# Patient Record
Sex: Female | Born: 1960 | Race: Black or African American | Hispanic: No | Marital: Single | Smoking: Never smoker
Health system: Southern US, Community
[De-identification: ages and names within clinical notes are randomized; demographics above are authoritative.]

## PROBLEM LIST (undated history)

## (undated) DIAGNOSIS — M797 Fibromyalgia: Secondary | ICD-10-CM

## (undated) DIAGNOSIS — J45909 Unspecified asthma, uncomplicated: Secondary | ICD-10-CM

## (undated) DIAGNOSIS — M1990 Unspecified osteoarthritis, unspecified site: Secondary | ICD-10-CM

## (undated) DIAGNOSIS — M2550 Pain in unspecified joint: Secondary | ICD-10-CM

## (undated) HISTORY — PX: FOOT SURGERY: SHX648

## (undated) HISTORY — DX: Unspecified asthma, uncomplicated: J45.909

## (undated) HISTORY — PX: CATARACT EXTRACTION: SUR2

## (undated) HISTORY — PX: ABDOMINAL HYSTERECTOMY: SHX81

## (undated) HISTORY — PX: CHOLECYSTECTOMY: SHX55

---

## 2000-06-13 ENCOUNTER — Observation Stay (HOSPITAL_COMMUNITY): Admission: EM | Admit: 2000-06-13 | Discharge: 2000-06-14 | Payer: Self-pay | Admitting: *Deleted

## 2000-06-23 ENCOUNTER — Emergency Department (HOSPITAL_COMMUNITY): Admission: EM | Admit: 2000-06-23 | Discharge: 2000-06-23 | Payer: Self-pay | Admitting: Emergency Medicine

## 2001-06-09 ENCOUNTER — Emergency Department (HOSPITAL_COMMUNITY): Admission: EM | Admit: 2001-06-09 | Discharge: 2001-06-09 | Payer: Self-pay | Admitting: Emergency Medicine

## 2002-06-08 ENCOUNTER — Encounter: Admission: RE | Admit: 2002-06-08 | Discharge: 2002-06-08 | Payer: Self-pay | Admitting: Occupational Medicine

## 2002-06-08 ENCOUNTER — Encounter: Payer: Self-pay | Admitting: Occupational Medicine

## 2002-07-07 ENCOUNTER — Emergency Department (HOSPITAL_COMMUNITY): Admission: EM | Admit: 2002-07-07 | Discharge: 2002-07-07 | Payer: Self-pay | Admitting: Emergency Medicine

## 2003-03-14 ENCOUNTER — Encounter: Payer: Self-pay | Admitting: Internal Medicine

## 2003-03-14 ENCOUNTER — Encounter: Admission: RE | Admit: 2003-03-14 | Discharge: 2003-03-14 | Payer: Self-pay | Admitting: Internal Medicine

## 2003-06-21 ENCOUNTER — Emergency Department (HOSPITAL_COMMUNITY): Admission: EM | Admit: 2003-06-21 | Discharge: 2003-06-21 | Payer: Self-pay | Admitting: Emergency Medicine

## 2003-07-06 ENCOUNTER — Emergency Department (HOSPITAL_COMMUNITY): Admission: EM | Admit: 2003-07-06 | Discharge: 2003-07-06 | Payer: Self-pay | Admitting: Emergency Medicine

## 2003-07-07 ENCOUNTER — Emergency Department (HOSPITAL_COMMUNITY): Admission: EM | Admit: 2003-07-07 | Discharge: 2003-07-07 | Payer: Self-pay | Admitting: Emergency Medicine

## 2003-07-07 ENCOUNTER — Encounter: Payer: Self-pay | Admitting: Emergency Medicine

## 2003-12-05 ENCOUNTER — Encounter: Admission: RE | Admit: 2003-12-05 | Discharge: 2003-12-05 | Payer: Self-pay | Admitting: Internal Medicine

## 2003-12-13 ENCOUNTER — Encounter: Admission: RE | Admit: 2003-12-13 | Discharge: 2003-12-13 | Payer: Self-pay | Admitting: Occupational Medicine

## 2003-12-20 ENCOUNTER — Encounter: Admission: RE | Admit: 2003-12-20 | Discharge: 2004-02-02 | Payer: Self-pay | Admitting: Occupational Medicine

## 2004-01-30 ENCOUNTER — Emergency Department (HOSPITAL_COMMUNITY): Admission: EM | Admit: 2004-01-30 | Discharge: 2004-01-30 | Payer: Self-pay

## 2004-02-20 ENCOUNTER — Encounter: Admission: RE | Admit: 2004-02-20 | Discharge: 2004-03-01 | Payer: Self-pay | Admitting: Occupational Medicine

## 2004-03-01 ENCOUNTER — Emergency Department (HOSPITAL_COMMUNITY): Admission: EM | Admit: 2004-03-01 | Discharge: 2004-03-01 | Payer: Self-pay | Admitting: *Deleted

## 2004-03-09 ENCOUNTER — Emergency Department (HOSPITAL_COMMUNITY): Admission: AD | Admit: 2004-03-09 | Discharge: 2004-03-09 | Payer: Self-pay | Admitting: Family Medicine

## 2004-03-15 ENCOUNTER — Emergency Department (HOSPITAL_COMMUNITY): Admission: EM | Admit: 2004-03-15 | Discharge: 2004-03-15 | Payer: Self-pay | Admitting: Emergency Medicine

## 2004-03-27 ENCOUNTER — Emergency Department (HOSPITAL_COMMUNITY): Admission: EM | Admit: 2004-03-27 | Discharge: 2004-03-27 | Payer: Self-pay | Admitting: Emergency Medicine

## 2004-08-02 ENCOUNTER — Emergency Department (HOSPITAL_COMMUNITY): Admission: EM | Admit: 2004-08-02 | Discharge: 2004-08-02 | Payer: Self-pay | Admitting: Emergency Medicine

## 2004-12-31 ENCOUNTER — Ambulatory Visit (HOSPITAL_COMMUNITY): Admission: RE | Admit: 2004-12-31 | Discharge: 2004-12-31 | Payer: Self-pay | Admitting: Internal Medicine

## 2005-01-07 ENCOUNTER — Emergency Department (HOSPITAL_COMMUNITY): Admission: EM | Admit: 2005-01-07 | Discharge: 2005-01-07 | Payer: Self-pay | Admitting: Family Medicine

## 2005-03-10 ENCOUNTER — Emergency Department (HOSPITAL_COMMUNITY): Admission: EM | Admit: 2005-03-10 | Discharge: 2005-03-11 | Payer: Self-pay | Admitting: *Deleted

## 2005-03-10 ENCOUNTER — Emergency Department (HOSPITAL_COMMUNITY): Admission: EM | Admit: 2005-03-10 | Discharge: 2005-03-10 | Payer: Self-pay | Admitting: Emergency Medicine

## 2006-02-20 ENCOUNTER — Emergency Department: Payer: Self-pay | Admitting: Unknown Physician Specialty

## 2006-02-21 ENCOUNTER — Emergency Department: Payer: Self-pay | Admitting: Emergency Medicine

## 2006-05-29 ENCOUNTER — Ambulatory Visit (HOSPITAL_COMMUNITY): Admission: RE | Admit: 2006-05-29 | Discharge: 2006-05-29 | Payer: Self-pay | Admitting: Internal Medicine

## 2006-07-18 ENCOUNTER — Ambulatory Visit: Payer: Self-pay | Admitting: Internal Medicine

## 2006-07-20 ENCOUNTER — Ambulatory Visit: Payer: Self-pay | Admitting: Internal Medicine

## 2006-12-10 ENCOUNTER — Encounter: Payer: Self-pay | Admitting: Internal Medicine

## 2006-12-22 ENCOUNTER — Encounter: Payer: Self-pay | Admitting: Internal Medicine

## 2007-01-22 ENCOUNTER — Encounter: Payer: Self-pay | Admitting: Internal Medicine

## 2007-01-30 ENCOUNTER — Emergency Department: Payer: Self-pay | Admitting: Emergency Medicine

## 2007-05-31 ENCOUNTER — Ambulatory Visit (HOSPITAL_COMMUNITY): Admission: RE | Admit: 2007-05-31 | Discharge: 2007-05-31 | Payer: Self-pay | Admitting: Specialist

## 2008-05-31 ENCOUNTER — Ambulatory Visit (HOSPITAL_COMMUNITY): Admission: RE | Admit: 2008-05-31 | Discharge: 2008-05-31 | Payer: Self-pay

## 2008-06-18 ENCOUNTER — Emergency Department (HOSPITAL_COMMUNITY): Admission: EM | Admit: 2008-06-18 | Discharge: 2008-06-18 | Payer: Self-pay | Admitting: Emergency Medicine

## 2011-05-09 NOTE — Discharge Summary (Signed)
[name]. [facility]  Patient:    Gabrielle Shaw, Gabrielle Shaw                        MRN: [id number] Adm. Date:  [id number] Disc. Date: [id number] Attending:  Lenora Boys CC:         Rosanne Sack, M.D.             Kerry Kass, M.D. [facility]                           Discharge Summary  DISCHARGE DIAGNOSES: 1. Anaphylaxis, likely secondary to peanuts - resolved. 2. History of asthma. 3. History of multiple allergies including SULFA DRUGS, BIAXIN, ASPIRIN,    PEANUTS, BANANAS, APPLES, and CATS.  DISCHARGE MEDICATIONS: 1. Zaditor ophthalmic drops, two drops in each eye b.i.d. 2. Advair 50/500, two puffs b.i.d. 3. Claritin D 24, one p.o. q.d. 4. Flonase, two puffs in each nostril b.i.d. 5. Premarin 0.9 mg p.o. q.d. 6. ______ eyedrops dosed as per prior to admission.  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  She was in her usual health, feeling well, when she ate a and ice cream sandwich which she had brought to work.  She had had prior tongue swelling with eating peanuts and a Coke in the past.  She did not think that she was very allergic to peanuts but, when her face started swelling, she tried to take a dose of EpiPen and actually vomited it and passed out.  She was given epinephrine 0.3 mg and Solu-Medrol 125 mg (actually in the [facility] Emergency Room), and this was followed with 25 mg of Benadryl IV, Phenergan 12.5 mg IV several hours later and IV Pepcid.  She responded quite well to these therapies and, when evaluated by Dr. Dewaine Oats for admission, simply had slight residual urticaria over her forehead. Dr. Elliot Gurney, who evaluated her later in the day, revealed some residual peribuccal erythema and symptoms of a "thick tongue," and these resolved later that day.  On the morning of discharge, she felt well.  She was treated with intravenous steroids q.6h. as  well as hydroxyzine p.o. q.8h.  She feels back to baseline. Vital signs are stable and the thick tongue sensation has cleared and her lungs are clear.  She is to call Dr. Stevphen Rochester for follow up within the next week or so and abstain from allergies as listed above. DD:  06/14/00 TD:  06/16/00 Job: [id number]

## 2013-04-07 ENCOUNTER — Encounter (HOSPITAL_BASED_OUTPATIENT_CLINIC_OR_DEPARTMENT_OTHER): Payer: Self-pay

## 2013-04-07 ENCOUNTER — Emergency Department (HOSPITAL_BASED_OUTPATIENT_CLINIC_OR_DEPARTMENT_OTHER)
Admission: EM | Admit: 2013-04-07 | Discharge: 2013-04-08 | Disposition: A | Discharge status: Home / Self Care | Payer: PRIVATE HEALTH INSURANCE | Attending: Emergency Medicine | Admitting: Emergency Medicine

## 2013-04-07 ENCOUNTER — Emergency Department (HOSPITAL_BASED_OUTPATIENT_CLINIC_OR_DEPARTMENT_OTHER): Payer: PRIVATE HEALTH INSURANCE

## 2013-04-07 DIAGNOSIS — Z8739 Personal history of other diseases of the musculoskeletal system and connective tissue: Secondary | ICD-10-CM | POA: Insufficient documentation

## 2013-04-07 DIAGNOSIS — Z90710 Acquired absence of both cervix and uterus: Secondary | ICD-10-CM | POA: Insufficient documentation

## 2013-04-07 DIAGNOSIS — R160 Hepatomegaly, not elsewhere classified: Secondary | ICD-10-CM

## 2013-04-07 DIAGNOSIS — K7689 Other specified diseases of liver: Secondary | ICD-10-CM | POA: Insufficient documentation

## 2013-04-07 DIAGNOSIS — Z9089 Acquired absence of other organs: Secondary | ICD-10-CM | POA: Insufficient documentation

## 2013-04-07 DIAGNOSIS — R197 Diarrhea, unspecified: Secondary | ICD-10-CM | POA: Insufficient documentation

## 2013-04-07 DIAGNOSIS — R112 Nausea with vomiting, unspecified: Secondary | ICD-10-CM | POA: Insufficient documentation

## 2013-04-07 HISTORY — DX: Fibromyalgia: M79.7

## 2013-04-07 HISTORY — DX: Unspecified osteoarthritis, unspecified site: M19.90

## 2013-04-07 LAB — COMPREHENSIVE METABOLIC PANEL
ALT: 17 U/L (ref 0–35)
AST: 24 U/L (ref 0–37)
Albumin: 3.8 g/dL (ref 3.5–5.2)
Alkaline Phosphatase: 127 U/L — ABNORMAL HIGH (ref 39–117)
BUN: 9 mg/dL (ref 6–23)
CO2: 27 mEq/L (ref 19–32)
Calcium: 10.2 mg/dL (ref 8.4–10.5)
Chloride: 103 mEq/L (ref 96–112)
Creatinine, Ser: 0.8 mg/dL (ref 0.50–1.10)
GFR calc Af Amer: >90 mL/min (ref >90)
GFR calc non Af Amer: 83 mL/min — ABNORMAL LOW (ref >90)
Glucose, Bld: 109 mg/dL — ABNORMAL HIGH (ref 70–99)
Potassium: 3.8 mEq/L (ref 3.5–5.1)
Sodium: 140 mEq/L (ref 135–145)
Total Bilirubin: 0.2 mg/dL — ABNORMAL LOW (ref 0.3–1.2)
Total Protein: 7.7 g/dL (ref 6.0–8.3)

## 2013-04-07 LAB — LIPASE, BLOOD: Lipase: 17 U/L (ref 11–59)

## 2013-04-07 LAB — CBC WITH DIFFERENTIAL/PLATELET
Basophils Absolute: 0 10*3/uL (ref 0.0–0.1)
Basophils Relative: 0 % (ref 0–1)
Eosinophils Absolute: 0.3 10*3/uL (ref 0.0–0.7)
Eosinophils Relative: 3 % (ref 0–5)
HCT: 40.9 % (ref 36.0–46.0)
Hemoglobin: 13.8 g/dL (ref 12.0–15.0)
Lymphocytes Relative: 33 % (ref 12–46)
Lymphs Abs: 2.6 10*3/uL (ref 0.7–4.0)
MCH: 28.8 pg (ref 26.0–34.0)
MCHC: 33.7 g/dL (ref 30.0–36.0)
MCV: 85.4 fL (ref 78.0–100.0)
Monocytes Absolute: 0.8 10*3/uL (ref 0.1–1.0)
Monocytes Relative: 10 % (ref 3–12)
Neutro Abs: 4.4 10*3/uL (ref 1.7–7.7)
Neutrophils Relative %: 54 % (ref 43–77)
Platelets: 213 10*3/uL (ref 150–400)
RBC: 4.79 MIL/uL (ref 3.87–5.11)
RDW: 13.8 % (ref 11.5–15.5)
WBC: 8.1 10*3/uL (ref 4.0–10.5)

## 2013-04-07 MED ORDER — KETOROLAC TROMETHAMINE 30 MG/ML IJ SOLN
30.0000 mg | Freq: Once | INTRAMUSCULAR | Status: AC
  Administered 2013-04-07: 30 mg via INTRAVENOUS
  Filled 2013-04-07: qty 1

## 2013-04-07 MED ORDER — SODIUM CHLORIDE 0.9 % IV BOLUS (SEPSIS)
1000.0000 mL | Freq: Once | INTRAVENOUS | Status: AC
  Administered 2013-04-07: 1000 mL via INTRAVENOUS

## 2013-04-07 MED ORDER — ONDANSETRON HCL 4 MG/2ML IJ SOLN
4.0000 mg | Freq: Once | INTRAMUSCULAR | Status: AC
  Filled 2013-04-07: qty 2

## 2013-04-07 MED ORDER — ONDANSETRON HCL 4 MG/2ML IJ SOLN
4.0000 mg | Freq: Once | INTRAMUSCULAR | Status: AC
  Administered 2013-04-07: 4 mg via INTRAVENOUS
  Filled 2013-04-07: qty 2

## 2013-04-07 NOTE — ED Notes (Signed)
MD at bedside. 

## 2013-04-07 NOTE — ED Notes (Signed)
C/o abd pain started tonight-intermittent diarrhea x 1 week

## 2013-04-08 LAB — URINALYSIS, ROUTINE W REFLEX MICROSCOPIC
Bilirubin Urine: NEGATIVE
Glucose, UA: NEGATIVE mg/dL
Hgb urine dipstick: NEGATIVE
Ketones, ur: NEGATIVE mg/dL
Leukocytes, UA: NEGATIVE
Nitrite: NEGATIVE
Protein, ur: NEGATIVE mg/dL
Specific Gravity, Urine: 1.005 (ref 1.005–1.030)
Urobilinogen, UA: 0.2 mg/dL (ref 0.0–1.0)
pH: 6.5 (ref 5.0–8.0)

## 2013-04-08 MED ORDER — ONDANSETRON HCL 4 MG/2ML IJ SOLN
4.0000 mg | Freq: Once | INTRAMUSCULAR | Status: AC
  Administered 2013-04-08: 4 mg via INTRAVENOUS

## 2013-04-08 MED ORDER — ONDANSETRON HCL 4 MG/2ML IJ SOLN
INTRAMUSCULAR | Status: AC
  Filled 2013-04-08: qty 2

## 2013-04-08 NOTE — ED Notes (Signed)
MD at bedside. 

## 2013-04-08 NOTE — ED Provider Notes (Signed)
History     CSN: [id number]  Arrival date & time 04/07/13  2237   First MD Initiated Contact with Patient 04/07/13 2306      Chief Complaint  Patient presents with  . Abdominal Pain    (Consider location/radiation/quality/duration/timing/severity/associated sxs/prior treatment) Patient is a 52 y.o. female presenting with abdominal pain. The history is provided by the patient. No language interpreter was used.  Abdominal Pain Pain location:  Generalized Pain quality: cramping   Pain radiates to:  Does not radiate Pain severity:  Moderate Onset quality:  Gradual Timing:  Constant Progression:  Unchanged Chronicity:  Recurrent Context: not diet changes   Relieved by:  Nothing Worsened by:  Nothing tried Ineffective treatments:  None tried Associated symptoms: diarrhea, nausea and vomiting   Diarrhea:    Quality:  Watery   Number of occurrences:  5   Severity:  Moderate   Diarrhea duration: Had diarrhea with nausea and vomiting 5 days ago and then got better and it returned this aftrernoon.   Timing:  Sporadic   Progression:  Unchanged Risk factors: multiple surgeries   Symptoms returned this afternoon following eating sausage and hamburger pizza  Past Medical History  Diagnosis Date  . Fibromyalgia   . Osteoarthritis     Past Surgical History  Procedure Laterality Date  . Cholecystectomy    . Abdominal hysterectomy      No family history on file.  History  Substance Use Topics  . Smoking status: Never Smoker   . Smokeless tobacco: Not on file  . Alcohol Use: No    OB History   Grav Para Term Preterm Abortions TAB SAB Ect Mult Living                  Review of Systems  Gastrointestinal: Positive for nausea, vomiting, abdominal pain and diarrhea.  All other systems reviewed and are negative.    Allergies  Ivp dye; Banana; Biaxin; Latex; Peanuts; Sulfa antibiotics; and Tetrofosmin  Home Medications   Current Outpatient Rx  Name  Route  Sig   Dispense  Refill  . albuterol (PROVENTIL HFA;VENTOLIN HFA) 108 (90 BASE) MCG/ACT inhaler   Inhalation   Inhale 2 puffs into the lungs every 6 (six) hours as needed for wheezing.         . Beclomethasone Dipropionate (QVAR IN)   Inhalation   Inhale into the lungs.         . Famotidine (PEPCID PO)   Oral   Take by mouth.         . Naproxen (NAPROSYN PO)   Oral   Take by mouth.           BP 140/54  Pulse 56  Temp(Src) 98.3 F (36.8 C) (Oral)  Resp 16  Ht 5\' 2"  (1.575 m)  Wt 180 lb (81.647 kg)  BMI 32.91 kg/m2  SpO2 97%  Physical Exam  Constitutional: She is oriented to person, place, and time. She appears well-developed and well-nourished. No distress.  HENT:  Head: Normocephalic and atraumatic.  Mouth/Throat: Oropharynx is clear and moist.  Eyes: Conjunctivae are normal. Pupils are equal, round, and reactive to light.  Neck: Normal range of motion. Neck supple.  Cardiovascular: Normal rate and regular rhythm.   Pulmonary/Chest: Effort normal and breath sounds normal. No respiratory distress. She has no wheezes. She has no rales.  Abdominal: Soft. Bowel sounds are normal. She exhibits no distension and no mass. There is no tenderness. There is no rebound and no  guarding.  Musculoskeletal: Normal range of motion.  Neurological: She is alert and oriented to person, place, and time.  Skin: Skin is warm and dry.  Psychiatric: She has a normal mood and affect.    ED Course  Procedures (including critical care time)  Labs Reviewed  COMPREHENSIVE METABOLIC PANEL - Abnormal; Notable for the following:    Glucose, Bld 109 (*)    Alkaline Phosphatase 127 (*)    Total Bilirubin 0.2 (*)    GFR calc non Af Amer 83 (*)    All other components within normal limits  CBC WITH DIFFERENTIAL  URINALYSIS, ROUTINE W REFLEX MICROSCOPIC  LIPASE, BLOOD   Ct Abdomen Pelvis Wo Contrast  04/08/2013  *RADIOLOGY REPORT*  Clinical Data: Left-sided abdominal pain and diarrhea.  CT  ABDOMEN AND PELVIS WITHOUT CONTRAST  Technique:  Multidetector CT imaging of the abdomen and pelvis was performed following the standard protocol without intravenous contrast.  Comparison: None.  Findings: There is a 2.8 x 3.5 x 3.0 cm mass in this central of the right lobe of the liver, slightly low density as compared to the unenhanced liver.  Liver is otherwise normal.  Gallbladder has been removed.  The spleen is normal.  There are two soft tissue nodules anterior to the fundus of the stomach and adjacent to the anterior aspect of the spleen, one measuring 3.2 cm in diameter and the other being 2.4 cm in diameter.  I suspect these both represent accessory spleens.  None of the lesions is immediately adjacent to the tip of the left lobe the liver but I think it is separate from the liver.  The pancreas, adrenal glands, and kidneys are normal.  The bowel is normal.  Appendix has been removed.  Uterus has been removed.  No definite ovarian remnants.  No osseous abnormality.  IMPRESSION: Nonspecific 3.5 cm mass in the right lobe of the liver.  Because of the patient's iodinated contrast allergy, MRI of the liver with without MRI contrast is recommended for further evaluation.  There are a few small diverticula in the left side of the colon. No diverticulitis.  No other significant abnormalities.   Original Report Authenticated By: Francene Boyers, M.D.      1. Diarrhea   2. Liver mass       MDM  N/v/d.  Likely viral.  No signs of IBD nor diverticulitis.  Labs, vitals and exam benign.  Patient informed of all lab, urine and CT results.  Informed of 3.5 cm liver mass seen on CT scan and need for close follow up with her family doctor and need to call doctor in the am to arrange outpatient MRI of the liver/abdomen to characterize the mass seen on ct scan.  30 minutes spent answering patient's questions.  Patient verbalized understanding of all test and CT results and need for close follow up and need to  outpatient MRI and agrees to follow up with her family doctor.  States she will call her family doctor in the am to arrange this test.  Parke Simmers diet instructions provided.          Jasmine Awe, MD 04/08/13 [phone]

## 2014-02-19 ENCOUNTER — Emergency Department (HOSPITAL_BASED_OUTPATIENT_CLINIC_OR_DEPARTMENT_OTHER)
Admission: EM | Admit: 2014-02-19 | Discharge: 2014-02-19 | Disposition: A | Discharge status: Home / Self Care | Payer: PRIVATE HEALTH INSURANCE | Attending: Emergency Medicine | Admitting: Emergency Medicine

## 2014-02-19 ENCOUNTER — Emergency Department (HOSPITAL_BASED_OUTPATIENT_CLINIC_OR_DEPARTMENT_OTHER): Payer: PRIVATE HEALTH INSURANCE

## 2014-02-19 ENCOUNTER — Encounter (HOSPITAL_BASED_OUTPATIENT_CLINIC_OR_DEPARTMENT_OTHER): Payer: Self-pay | Admitting: Emergency Medicine

## 2014-02-19 DIAGNOSIS — J029 Acute pharyngitis, unspecified: Secondary | ICD-10-CM | POA: Insufficient documentation

## 2014-02-19 DIAGNOSIS — Z79899 Other long term (current) drug therapy: Secondary | ICD-10-CM | POA: Insufficient documentation

## 2014-02-19 DIAGNOSIS — J209 Acute bronchitis, unspecified: Secondary | ICD-10-CM

## 2014-02-19 DIAGNOSIS — Z8739 Personal history of other diseases of the musculoskeletal system and connective tissue: Secondary | ICD-10-CM | POA: Insufficient documentation

## 2014-02-19 DIAGNOSIS — Z91040 Latex allergy status: Secondary | ICD-10-CM | POA: Insufficient documentation

## 2014-02-19 DIAGNOSIS — H6690 Otitis media, unspecified, unspecified ear: Secondary | ICD-10-CM | POA: Insufficient documentation

## 2014-02-19 DIAGNOSIS — J329 Chronic sinusitis, unspecified: Secondary | ICD-10-CM | POA: Insufficient documentation

## 2014-02-19 DIAGNOSIS — H6691 Otitis media, unspecified, right ear: Secondary | ICD-10-CM

## 2014-02-19 MED ORDER — BENZONATATE 100 MG PO CAPS: 200.0000 mg | ORAL_CAPSULE | Freq: Three times a day (TID) | ORAL | Status: DC

## 2014-02-19 MED ORDER — ALBUTEROL SULFATE HFA 108 (90 BASE) MCG/ACT IN AERS
2.0000 | INHALATION_SPRAY | RESPIRATORY_TRACT | Status: DC | PRN
  Administered 2014-02-19: 2 via RESPIRATORY_TRACT
  Filled 2014-02-19: qty 6.7

## 2014-02-19 MED ORDER — AMOXICILLIN 500 MG PO CAPS: 500.0000 mg | ORAL_CAPSULE | Freq: Three times a day (TID) | ORAL | Status: DC

## 2014-02-19 MED ORDER — GUAIFENESIN-CODEINE 100-10 MG/5ML PO SYRP: 5.0000 mL | ORAL_SOLUTION | Freq: Three times a day (TID) | ORAL | Status: DC | PRN

## 2014-02-19 MED ORDER — DEXAMETHASONE SODIUM PHOSPHATE 10 MG/ML IJ SOLN
8.0000 mg | Freq: Once | INTRAMUSCULAR | Status: AC
  Administered 2014-02-19: 8 mg via INTRAMUSCULAR
  Filled 2014-02-19: qty 1

## 2014-02-19 NOTE — ED Provider Notes (Signed)
CSN: [id number]     Arrival date & time 02/19/14  1103 History   First MD Initiated Contact with Patient 02/19/14 1307     Chief Complaint  Patient presents with  . Cough     (Consider location/radiation/quality/duration/timing/severity/associated sxs/prior Treatment) Patient is a 53 y.o. female presenting with cough. The history is provided by the patient.  Cough Cough characteristics:  Productive Sputum characteristics:  Green Severity:  Moderate Onset quality:  Gradual Duration:  4 weeks Timing:  Constant Progression:  Unchanged Chronicity:  New Smoker: no   Relieved by:  Nothing Associated symptoms: chills, fever, sore throat and wheezing   Associated symptoms: no headaches and no rash    Gabrielle Shaw is a 54 y.o. female who presents to the ED with cough and congestion and right ear pain. She states she started with symptoms about a month ago and saw her PCP and he gave her cough medication and decongestants. She got better for a little while and then it came back worse. She complains of burning in her chest from coughing and wheezing. She is sore from coughing so much.  Past Medical History  Diagnosis Date  . Fibromyalgia   . Osteoarthritis    Past Surgical History  Procedure Laterality Date  . Cholecystectomy    . Abdominal hysterectomy     No family history on file. History  Substance Use Topics  . Smoking status: Never Smoker   . Smokeless tobacco: Not on file  . Alcohol Use: No   OB History   Grav Para Term Preterm Abortions TAB SAB Ect Mult Living                 Review of Systems  Constitutional: Positive for fever and chills.  HENT: Positive for congestion, sinus pressure and sore throat. Negative for trouble swallowing.   Respiratory: Positive for cough and wheezing.   Gastrointestinal: Negative for nausea, vomiting, diarrhea and abdominal distention.  Genitourinary: Negative for dysuria and urgency.  Musculoskeletal: Negative for back pain.   Skin: Negative for rash.  Neurological: Negative for syncope and headaches.  Psychiatric/Behavioral: Negative for confusion. The patient is not nervous/anxious.       Allergies  Ivp dye; Banana; Biaxin; Latex; Peanuts; Sulfa antibiotics; and Tetrofosmin  Home Medications   Current Outpatient Rx  Name  Route  Sig  Dispense  Refill  . fexofenadine (ALLEGRA) 180 MG tablet   Oral   Take 180 mg by mouth daily.         Marland Kitchen albuterol (PROVENTIL HFA;VENTOLIN HFA) 108 (90 BASE) MCG/ACT inhaler   Inhalation   Inhale 2 puffs into the lungs every 6 (six) hours as needed for wheezing.         . Beclomethasone Dipropionate (QVAR IN)   Inhalation   Inhale into the lungs.         . Famotidine (PEPCID PO)   Oral   Take by mouth.          BP 150/58  Pulse 66  Temp(Src) 99.2 F (37.3 C) (Oral)  Resp 20  SpO2 100% Physical Exam  Nursing note and vitals reviewed. Constitutional: She is oriented to person, place, and time. She appears well-developed and well-nourished. No distress.  HENT:  Head: Normocephalic and atraumatic.  Right Ear: Tympanic membrane is erythematous.  Left Ear: Tympanic membrane normal.  Nose: Mucosal edema and rhinorrhea present. Right sinus exhibits maxillary sinus tenderness. Left sinus exhibits maxillary sinus tenderness.  Mouth/Throat: Uvula is midline, oropharynx is  clear and moist and mucous membranes are normal.  Eyes: EOM are normal.  Neck: Neck supple.  Cardiovascular: Normal rate and regular rhythm.   Pulmonary/Chest: Effort normal. She has wheezes in the right upper field. She has no rhonchi. She has no rales.  Abdominal: Soft. There is no tenderness.  Musculoskeletal: Normal range of motion.  Neurological: She is alert and oriented to person, place, and time. No cranial nerve deficit.  Skin: Skin is warm and dry.  Psychiatric: She has a normal mood and affect. Her behavior is normal.    ED Course  Procedures (including critical care  time) Labs Review Labs Reviewed - No data to display Imaging Review Dg Chest 2 View  02/19/2014   CLINICAL DATA:  Patient with dry cough x 1 month. No other symptoms.  EXAM: CHEST - 2 VIEW  COMPARISON:  None available  FINDINGS: Lungs are clear. Heart size upper limits normal. Surgical clips in the right upper abdomen. . No effusion. Visualized skeletal structures are unremarkable.  IMPRESSION: No acute cardiopulmonary disease.   Electronically Signed   By: Arne Cleveland M.D.   On: 02/19/2014 12:06     MDM  53 y.o. female with bronchitis and sinusitis. Will treat with antibiotics and cough medication. She will follow up with her PCP. She will return here as needed. Stable for discharge without any immediate complications. I have reviewed this patient's vital signs, nurses notes, appropriate labs and imaging.  I have discussed findings and plan of care with the patient   Medication List    TAKE these medications       amoxicillin 500 MG capsule  Commonly known as:  AMOXIL  Take 1 capsule (500 mg total) by mouth 3 (three) times daily.     benzonatate 100 MG capsule  Commonly known as:  TESSALON  Take 2 capsules (200 mg total) by mouth every 8 (eight) hours.     guaiFENesin-codeine 100-10 MG/5ML syrup  Commonly known as:  ROBITUSSIN AC  Take 5 mLs by mouth 3 (three) times daily as needed for cough.      ASK your doctor about these medications       albuterol 108 (90 BASE) MCG/ACT inhaler  Commonly known as:  PROVENTIL HFA;VENTOLIN HFA  Inhale 2 puffs into the lungs every 6 (six) hours as needed for wheezing.     fexofenadine 180 MG tablet  Commonly known as:  ALLEGRA  Take 180 mg by mouth daily.     PEPCID PO  Take by mouth.     QVAR IN  Inhale into the lungs.          [place], [place] 02/19/14 [phone]

## 2014-02-19 NOTE — Discharge Instructions (Signed)
Follow up with your doctor or return here as needed.  °

## 2014-02-19 NOTE — ED Notes (Signed)
Patient here with dry cough x 1 month. Reports she is sore in chest and back from all the coughing, no fevers

## 2014-02-24 NOTE — ED Provider Notes (Signed)
Medical screening examination/treatment/procedure(s) were performed by non-physician practitioner and as supervising physician I was immediately available for consultation/collaboration.   Dot Lanes, MD 02/24/14 [phone]

## 2014-10-14 ENCOUNTER — Emergency Department (HOSPITAL_BASED_OUTPATIENT_CLINIC_OR_DEPARTMENT_OTHER)
Admission: EM | Admit: 2014-10-14 | Discharge: 2014-10-14 | Disposition: A | Discharge status: Home / Self Care | Payer: No Typology Code available for payment source | Attending: Emergency Medicine | Admitting: Emergency Medicine

## 2014-10-14 DIAGNOSIS — Z91040 Latex allergy status: Secondary | ICD-10-CM | POA: Diagnosis not present

## 2014-10-14 DIAGNOSIS — Z792 Long term (current) use of antibiotics: Secondary | ICD-10-CM | POA: Diagnosis not present

## 2014-10-14 DIAGNOSIS — Y92410 Unspecified street and highway as the place of occurrence of the external cause: Secondary | ICD-10-CM | POA: Diagnosis not present

## 2014-10-14 DIAGNOSIS — Y9389 Activity, other specified: Secondary | ICD-10-CM | POA: Diagnosis not present

## 2014-10-14 DIAGNOSIS — S199XXA Unspecified injury of neck, initial encounter: Secondary | ICD-10-CM | POA: Diagnosis present

## 2014-10-14 DIAGNOSIS — S161XXA Strain of muscle, fascia and tendon at neck level, initial encounter: Secondary | ICD-10-CM

## 2014-10-14 DIAGNOSIS — S39012A Strain of muscle, fascia and tendon of lower back, initial encounter: Secondary | ICD-10-CM | POA: Insufficient documentation

## 2014-10-14 DIAGNOSIS — Z79899 Other long term (current) drug therapy: Secondary | ICD-10-CM | POA: Diagnosis not present

## 2014-10-14 DIAGNOSIS — Z8739 Personal history of other diseases of the musculoskeletal system and connective tissue: Secondary | ICD-10-CM | POA: Insufficient documentation

## 2014-10-14 MED ORDER — NAPROXEN 500 MG PO TABS: 500.0000 mg | ORAL_TABLET | Freq: Two times a day (BID) | ORAL | Status: DC

## 2014-10-14 MED ORDER — HYDROCODONE-ACETAMINOPHEN 5-325 MG PO TABS
2.0000 | ORAL_TABLET | ORAL | Status: DC | PRN
Start: 2014-10-14 — End: 2015-06-28

## 2014-10-14 MED ORDER — METHOCARBAMOL 500 MG PO TABS: 500.0000 mg | ORAL_TABLET | Freq: Three times a day (TID) | ORAL | Status: DC | PRN

## 2014-10-14 NOTE — Discharge Instructions (Signed)
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
## 2015-06-20 ENCOUNTER — Emergency Department (HOSPITAL_BASED_OUTPATIENT_CLINIC_OR_DEPARTMENT_OTHER): Payer: Medicare Other

## 2015-06-20 ENCOUNTER — Encounter (HOSPITAL_BASED_OUTPATIENT_CLINIC_OR_DEPARTMENT_OTHER): Payer: Self-pay | Admitting: *Deleted

## 2015-06-20 ENCOUNTER — Emergency Department (HOSPITAL_BASED_OUTPATIENT_CLINIC_OR_DEPARTMENT_OTHER)
Admission: EM | Admit: 2015-06-20 | Discharge: 2015-06-20 | Disposition: A | Discharge status: Home / Self Care | Payer: Medicare Other | Attending: Emergency Medicine | Admitting: Emergency Medicine

## 2015-06-20 DIAGNOSIS — Y92410 Unspecified street and highway as the place of occurrence of the external cause: Secondary | ICD-10-CM | POA: Diagnosis not present

## 2015-06-20 DIAGNOSIS — S199XXA Unspecified injury of neck, initial encounter: Secondary | ICD-10-CM | POA: Diagnosis present

## 2015-06-20 DIAGNOSIS — S8991XA Unspecified injury of right lower leg, initial encounter: Secondary | ICD-10-CM | POA: Insufficient documentation

## 2015-06-20 DIAGNOSIS — S8992XA Unspecified injury of left lower leg, initial encounter: Secondary | ICD-10-CM | POA: Insufficient documentation

## 2015-06-20 DIAGNOSIS — S4992XA Unspecified injury of left shoulder and upper arm, initial encounter: Secondary | ICD-10-CM | POA: Insufficient documentation

## 2015-06-20 DIAGNOSIS — Z91040 Latex allergy status: Secondary | ICD-10-CM | POA: Insufficient documentation

## 2015-06-20 DIAGNOSIS — Z79899 Other long term (current) drug therapy: Secondary | ICD-10-CM | POA: Insufficient documentation

## 2015-06-20 DIAGNOSIS — M1990 Unspecified osteoarthritis, unspecified site: Secondary | ICD-10-CM | POA: Diagnosis not present

## 2015-06-20 DIAGNOSIS — M25569 Pain in unspecified knee: Secondary | ICD-10-CM

## 2015-06-20 DIAGNOSIS — Z792 Long term (current) use of antibiotics: Secondary | ICD-10-CM | POA: Diagnosis not present

## 2015-06-20 DIAGNOSIS — S99912A Unspecified injury of left ankle, initial encounter: Secondary | ICD-10-CM | POA: Diagnosis not present

## 2015-06-20 DIAGNOSIS — Y998 Other external cause status: Secondary | ICD-10-CM | POA: Diagnosis not present

## 2015-06-20 DIAGNOSIS — Y9389 Activity, other specified: Secondary | ICD-10-CM | POA: Insufficient documentation

## 2015-06-20 DIAGNOSIS — Z791 Long term (current) use of non-steroidal anti-inflammatories (NSAID): Secondary | ICD-10-CM | POA: Diagnosis not present

## 2015-06-20 DIAGNOSIS — S3992XA Unspecified injury of lower back, initial encounter: Secondary | ICD-10-CM | POA: Insufficient documentation

## 2015-06-20 HISTORY — DX: Pain in unspecified joint: M25.50

## 2015-06-20 MED ORDER — IBUPROFEN 800 MG PO TABS: 800.0000 mg | ORAL_TABLET | Freq: Three times a day (TID) | ORAL | Status: AC | PRN

## 2015-06-20 MED ORDER — OXYCODONE-ACETAMINOPHEN 5-325 MG PO TABS: 1.0000 | ORAL_TABLET | ORAL | Status: DC | PRN

## 2015-06-20 MED ORDER — OXYCODONE-ACETAMINOPHEN 5-325 MG PO TABS
2.0000 | ORAL_TABLET | Freq: Once | ORAL | Status: AC
  Administered 2015-06-20: 2 via ORAL
  Filled 2015-06-20: qty 2
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
## 2015-06-28 ENCOUNTER — Emergency Department (HOSPITAL_BASED_OUTPATIENT_CLINIC_OR_DEPARTMENT_OTHER)
Admission: EM | Admit: 2015-06-28 | Discharge: 2015-06-28 | Disposition: A | Discharge status: Home / Self Care | Payer: Medicaid Other | Attending: Emergency Medicine | Admitting: Emergency Medicine

## 2015-06-28 ENCOUNTER — Encounter (HOSPITAL_BASED_OUTPATIENT_CLINIC_OR_DEPARTMENT_OTHER): Payer: Self-pay | Admitting: Emergency Medicine

## 2015-06-28 DIAGNOSIS — Z87828 Personal history of other (healed) physical injury and trauma: Secondary | ICD-10-CM | POA: Insufficient documentation

## 2015-06-28 DIAGNOSIS — M25561 Pain in right knee: Secondary | ICD-10-CM

## 2015-06-28 DIAGNOSIS — M1990 Unspecified osteoarthritis, unspecified site: Secondary | ICD-10-CM | POA: Diagnosis not present

## 2015-06-28 DIAGNOSIS — Z79899 Other long term (current) drug therapy: Secondary | ICD-10-CM | POA: Insufficient documentation

## 2015-06-28 DIAGNOSIS — Z7951 Long term (current) use of inhaled steroids: Secondary | ICD-10-CM | POA: Diagnosis not present

## 2015-06-28 DIAGNOSIS — M797 Fibromyalgia: Secondary | ICD-10-CM | POA: Diagnosis not present

## 2015-06-28 DIAGNOSIS — Z792 Long term (current) use of antibiotics: Secondary | ICD-10-CM | POA: Insufficient documentation

## 2015-06-28 MED ORDER — HYDROCODONE-ACETAMINOPHEN 5-325 MG PO TABS
2.0000 | ORAL_TABLET | Freq: Once | ORAL | Status: AC
  Administered 2015-06-28: 2 via ORAL
  Filled 2015-06-28: qty 2

## 2015-06-28 MED ORDER — HYDROCODONE-ACETAMINOPHEN 5-325 MG PO TABS: 1.0000 | ORAL_TABLET | ORAL | Status: DC | PRN

## 2015-06-28 NOTE — ED Notes (Signed)
Patient reports that her pain is still in her bilateral knees. She still has a lot of pain

## 2015-06-28 NOTE — Discharge Instructions (Signed)
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
## 2015-07-08 ENCOUNTER — Encounter (HOSPITAL_BASED_OUTPATIENT_CLINIC_OR_DEPARTMENT_OTHER): Payer: Self-pay | Admitting: Emergency Medicine

## 2015-07-08 ENCOUNTER — Emergency Department (HOSPITAL_BASED_OUTPATIENT_CLINIC_OR_DEPARTMENT_OTHER)
Admission: EM | Admit: 2015-07-08 | Discharge: 2015-07-08 | Disposition: A | Discharge status: Home / Self Care | Payer: Medicare Other | Attending: Emergency Medicine | Admitting: Emergency Medicine

## 2015-07-08 DIAGNOSIS — Y9301 Activity, walking, marching and hiking: Secondary | ICD-10-CM | POA: Insufficient documentation

## 2015-07-08 DIAGNOSIS — M797 Fibromyalgia: Secondary | ICD-10-CM | POA: Diagnosis not present

## 2015-07-08 DIAGNOSIS — Y9289 Other specified places as the place of occurrence of the external cause: Secondary | ICD-10-CM | POA: Diagnosis not present

## 2015-07-08 DIAGNOSIS — Z791 Long term (current) use of non-steroidal anti-inflammatories (NSAID): Secondary | ICD-10-CM | POA: Diagnosis not present

## 2015-07-08 DIAGNOSIS — X58XXXA Exposure to other specified factors, initial encounter: Secondary | ICD-10-CM | POA: Insufficient documentation

## 2015-07-08 DIAGNOSIS — Z79899 Other long term (current) drug therapy: Secondary | ICD-10-CM | POA: Insufficient documentation

## 2015-07-08 DIAGNOSIS — M1990 Unspecified osteoarthritis, unspecified site: Secondary | ICD-10-CM | POA: Diagnosis not present

## 2015-07-08 DIAGNOSIS — Y998 Other external cause status: Secondary | ICD-10-CM | POA: Insufficient documentation

## 2015-07-08 DIAGNOSIS — Z91040 Latex allergy status: Secondary | ICD-10-CM | POA: Diagnosis not present

## 2015-07-08 DIAGNOSIS — S8991XA Unspecified injury of right lower leg, initial encounter: Secondary | ICD-10-CM | POA: Diagnosis present

## 2015-07-08 DIAGNOSIS — S8391XA Sprain of unspecified site of right knee, initial encounter: Secondary | ICD-10-CM | POA: Diagnosis not present

## 2015-07-08 DIAGNOSIS — Z792 Long term (current) use of antibiotics: Secondary | ICD-10-CM | POA: Insufficient documentation

## 2015-07-08 MED ORDER — HYDROCODONE-ACETAMINOPHEN 5-325 MG PO TABS
2.0000 | ORAL_TABLET | Freq: Once | ORAL | Status: AC
  Administered 2015-07-08: 2 via ORAL
  Filled 2015-07-08: qty 2
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
## 2015-07-08 NOTE — ED Notes (Addendum)
Pt in c/o RLE pain onset last night, but today her R knee buckled while she was walking and now she has a burning pain shooting from R knee to foot. No erythema or swelling noted.

## 2015-07-08 NOTE — Discharge Instructions (Signed)
Knee Immobilizer Wear the knee immobilizer for comfort. Remove it when showering or sleeping. Apply an ice pack to your right knee 4 times daily for 30 minutes of time which will help with pain. Avoid weightbearing as much as possible. Take the pain medication (Norco) prescribed to you at your last visit here as needed for bad pain or take Tylenol for mild pain. Don't take Tylenol and the pain medication prescribed together as, nasal be dangerous. See your orthopedist in [facility] if having significant pain or difficulty walking in a week A knee immobilizer, also known as a knee brace, supports your knee and keeps you from bending it. The knee brace may be a splint or a cast. Wear your knee brace and remove the knee brace as told by your doctor. HOME CARE   Use powder (such as baby or talcum powder) to help with sweat and rubbing.  Adjust the brace as often as needed while wearing it. It should be firm but not tight. Signs that the brace is too tight include:  Puffiness (swelling).  Numbness.  Color change in your foot or ankle.  Increased pain.  While resting, keep your leg above the level of your heart. Use pillows for support.  Remove the knee brace to bathe and sleep. GET HELP RIGHT AWAY IF:   There is more pain or puffiness in the knee, foot, or ankle.  You have problems because of the brace or the brace breaks. MAKE SURE YOU:   Understand these instructions.  Will watch this condition.  Will get help right away if you are not doing well or get worse. Document Released: 09/16/2008 Document Revised: 12/13/2013 Document Reviewed: 08/01/2013 [facility] Patient Information 2015 [place], [place]. This information is not intended to replace advice given to you by your health care provider. Make sure you discuss any questions you have with your health care provider.

## 2015-08-24 ENCOUNTER — Encounter: Payer: Self-pay | Admitting: Allergy

## 2015-09-18 ENCOUNTER — Ambulatory Visit (INDEPENDENT_AMBULATORY_CARE_PROVIDER_SITE_OTHER): Payer: Medicare Other | Admitting: *Deleted

## 2015-09-18 DIAGNOSIS — J309 Allergic rhinitis, unspecified: Secondary | ICD-10-CM | POA: Diagnosis not present

## 2015-09-24 ENCOUNTER — Ambulatory Visit (INDEPENDENT_AMBULATORY_CARE_PROVIDER_SITE_OTHER): Payer: Medicare Other | Admitting: *Deleted

## 2015-09-24 DIAGNOSIS — J309 Allergic rhinitis, unspecified: Secondary | ICD-10-CM

## 2015-10-01 ENCOUNTER — Ambulatory Visit (INDEPENDENT_AMBULATORY_CARE_PROVIDER_SITE_OTHER): Payer: Medicare Other

## 2015-10-01 DIAGNOSIS — J309 Allergic rhinitis, unspecified: Secondary | ICD-10-CM

## 2015-10-08 ENCOUNTER — Ambulatory Visit (INDEPENDENT_AMBULATORY_CARE_PROVIDER_SITE_OTHER): Payer: Medicare Other | Admitting: *Deleted

## 2015-10-08 DIAGNOSIS — J309 Allergic rhinitis, unspecified: Secondary | ICD-10-CM

## 2015-10-15 ENCOUNTER — Ambulatory Visit (INDEPENDENT_AMBULATORY_CARE_PROVIDER_SITE_OTHER): Payer: Medicare Other | Admitting: *Deleted

## 2015-10-15 DIAGNOSIS — J309 Allergic rhinitis, unspecified: Secondary | ICD-10-CM

## 2015-10-22 ENCOUNTER — Ambulatory Visit (INDEPENDENT_AMBULATORY_CARE_PROVIDER_SITE_OTHER): Payer: Medicare Other | Admitting: *Deleted

## 2015-10-22 DIAGNOSIS — J309 Allergic rhinitis, unspecified: Secondary | ICD-10-CM

## 2015-10-29 ENCOUNTER — Ambulatory Visit (INDEPENDENT_AMBULATORY_CARE_PROVIDER_SITE_OTHER): Payer: Medicare Other

## 2015-10-29 DIAGNOSIS — J309 Allergic rhinitis, unspecified: Secondary | ICD-10-CM

## 2015-11-14 ENCOUNTER — Ambulatory Visit (INDEPENDENT_AMBULATORY_CARE_PROVIDER_SITE_OTHER): Payer: Medicare Other

## 2015-11-14 DIAGNOSIS — J309 Allergic rhinitis, unspecified: Secondary | ICD-10-CM | POA: Diagnosis not present

## 2015-11-20 ENCOUNTER — Ambulatory Visit (INDEPENDENT_AMBULATORY_CARE_PROVIDER_SITE_OTHER): Payer: Medicare Other | Admitting: *Deleted

## 2015-11-20 DIAGNOSIS — J309 Allergic rhinitis, unspecified: Secondary | ICD-10-CM

## 2015-11-29 ENCOUNTER — Ambulatory Visit (INDEPENDENT_AMBULATORY_CARE_PROVIDER_SITE_OTHER): Payer: Medicare Other

## 2015-11-29 DIAGNOSIS — J309 Allergic rhinitis, unspecified: Secondary | ICD-10-CM

## 2015-12-06 ENCOUNTER — Ambulatory Visit (INDEPENDENT_AMBULATORY_CARE_PROVIDER_SITE_OTHER): Payer: Medicare Other | Admitting: *Deleted

## 2015-12-06 DIAGNOSIS — J309 Allergic rhinitis, unspecified: Secondary | ICD-10-CM | POA: Diagnosis not present

## 2015-12-10 ENCOUNTER — Ambulatory Visit (INDEPENDENT_AMBULATORY_CARE_PROVIDER_SITE_OTHER): Payer: Medicare Other

## 2015-12-10 DIAGNOSIS — J309 Allergic rhinitis, unspecified: Secondary | ICD-10-CM

## 2015-12-11 DIAGNOSIS — J301 Allergic rhinitis due to pollen: Secondary | ICD-10-CM | POA: Diagnosis not present

## 2015-12-12 DIAGNOSIS — J3089 Other allergic rhinitis: Secondary | ICD-10-CM | POA: Diagnosis not present

## 2015-12-19 ENCOUNTER — Ambulatory Visit (INDEPENDENT_AMBULATORY_CARE_PROVIDER_SITE_OTHER): Payer: Medicare Other

## 2015-12-19 DIAGNOSIS — J309 Allergic rhinitis, unspecified: Secondary | ICD-10-CM | POA: Diagnosis not present

## 2015-12-27 ENCOUNTER — Ambulatory Visit (INDEPENDENT_AMBULATORY_CARE_PROVIDER_SITE_OTHER): Payer: Medicare Other | Admitting: *Deleted

## 2015-12-27 DIAGNOSIS — J309 Allergic rhinitis, unspecified: Secondary | ICD-10-CM

## 2016-01-02 ENCOUNTER — Ambulatory Visit (INDEPENDENT_AMBULATORY_CARE_PROVIDER_SITE_OTHER): Payer: Medicare Other | Admitting: *Deleted

## 2016-01-02 DIAGNOSIS — J309 Allergic rhinitis, unspecified: Secondary | ICD-10-CM | POA: Diagnosis not present

## 2016-01-10 ENCOUNTER — Ambulatory Visit (INDEPENDENT_AMBULATORY_CARE_PROVIDER_SITE_OTHER): Payer: Medicare Other | Admitting: *Deleted

## 2016-01-10 DIAGNOSIS — J309 Allergic rhinitis, unspecified: Secondary | ICD-10-CM | POA: Diagnosis not present

## 2016-01-16 ENCOUNTER — Ambulatory Visit: Payer: Medicare Other

## 2016-01-16 ENCOUNTER — Ambulatory Visit (INDEPENDENT_AMBULATORY_CARE_PROVIDER_SITE_OTHER): Payer: Medicare Other | Admitting: Internal Medicine

## 2016-01-16 ENCOUNTER — Encounter: Payer: Self-pay | Admitting: Internal Medicine

## 2016-01-16 VITALS — BP 150/84 | HR 72 | Temp 98.3°F | Resp 20 | Ht 61.02 in | Wt 181.4 lb

## 2016-01-16 DIAGNOSIS — J3089 Other allergic rhinitis: Secondary | ICD-10-CM | POA: Diagnosis not present

## 2016-01-16 DIAGNOSIS — J309 Allergic rhinitis, unspecified: Secondary | ICD-10-CM

## 2016-01-16 DIAGNOSIS — J4530 Mild persistent asthma, uncomplicated: Secondary | ICD-10-CM

## 2016-01-16 DIAGNOSIS — J4540 Moderate persistent asthma, uncomplicated: Secondary | ICD-10-CM | POA: Insufficient documentation

## 2016-01-16 MED ORDER — FLUTICASONE PROPIONATE 50 MCG/ACT NA SUSP: NASAL | Status: DC

## 2016-01-16 MED ORDER — EPINEPHRINE 0.3 MG/0.3ML IJ SOAJ: INTRAMUSCULAR | Status: DC

## 2016-01-16 MED ORDER — ALBUTEROL SULFATE HFA 108 (90 BASE) MCG/ACT IN AERS: 2.0000 | INHALATION_SPRAY | RESPIRATORY_TRACT | Status: DC | PRN

## 2016-01-16 MED ORDER — OLOPATADINE HCL 0.2 % OP SOLN: OPHTHALMIC | Status: DC

## 2016-01-16 MED ORDER — LEVOCETIRIZINE DIHYDROCHLORIDE 5 MG PO TABS: 5.0000 mg | ORAL_TABLET | Freq: Every evening | ORAL | Status: DC

## 2016-01-16 MED ORDER — HYDROXYZINE HCL 25 MG PO TABS: ORAL_TABLET | ORAL | Status: AC

## 2016-01-16 MED ORDER — BECLOMETHASONE DIPROPIONATE 80 MCG/ACT IN AERS: INHALATION_SPRAY | RESPIRATORY_TRACT | Status: DC

## 2016-01-16 NOTE — Assessment & Plan Note (Signed)
   Currently well controlled  She will use Qvar 80 g 2 puffs in the morning on a regular basis. When she is sick, she may increase the dose to twice a day  Use albuterol as needed

## 2016-01-16 NOTE — Patient Instructions (Signed)
Mild persistent asthma  Currently well controlled  She will use Qvar 80 g 2 puffs in the morning on a regular basis. When she is sick, she may increase the dose to twice a day  Use albuterol as needed  Other allergic rhinitis  On immunotherapy, currently well controlled  Continue Xyzal (levocetirizine) 5 mg daily, Pataday 1 drop each eye daily, and fluticasone nasal spray (Flonase) 2 sprays each nostril daily  Has EpiPen and action plan-educated on use  For itch, may use hydroxyzine or Benadryl 25 mg every 4-6 hours as needed

## 2016-01-16 NOTE — Assessment & Plan Note (Signed)
   On immunotherapy, currently well controlled  Continue Xyzal (levocetirizine) 5 mg daily, Pataday 1 drop each eye daily, and fluticasone nasal spray (Flonase) 2 sprays each nostril daily  Has EpiPen and action plan-educated on use  For itch, may use hydroxyzine or Benadryl 25 mg every 4-6 hours as needed

## 2016-01-16 NOTE — Progress Notes (Signed)
History of Present Illness: Gabrielle Shaw is a 55 y.o. female presenting for follow-up.  HPI Comments: Allergic rhinitis on immunotherapy: Start 11/07/2013, she reached maintenance 09/18/2015. She is on shots every 2 weeks. She is doing well without any shot complications or breakthrough symptoms. She does get a sinus infection about once a year her last one was in September.  Asthma: Symptoms have been stable since her last visit without any severe exacerbations or need for prednisone, extra doctor's visits or trips the emergency room. Exposure to pet dander is her main trigger. She is using Qvar 2 puffs twice a day 3-7 times per week and uses albuterol about 3 times a week as well.   Assessment and Plan: Mild persistent asthma  Currently well controlled  She will use Qvar 80 g 2 puffs in the morning on a regular basis. When she is sick, she may increase the dose to twice a day  Use albuterol as needed  Other allergic rhinitis  On immunotherapy, currently well controlled  Continue Xyzal (levocetirizine) 5 mg daily, Pataday 1 drop each eye daily, and fluticasone nasal spray (Flonase) 2 sprays each nostril daily  Has EpiPen and action plan-educated on use  For itch, may use hydroxyzine or Benadryl 25 mg every 4-6 hours as needed    Return in about 1 year (around 01/15/2017).  Medications ordered this encounter:  Meds ordered this encounter  Medications  . cromolyn (NASALCROM) 5.2 MG/ACT nasal spray    Sig: Place into the nose.  Marland Kitchen DISCONTD: fluticasone (FLOVENT HFA) 220 MCG/ACT inhaler    Sig: Inhale 2 puffs into the lungs as needed.   . fluticasone (FLONASE) 50 MCG/ACT nasal spray    Sig: 2 sprays by Each Nare route daily.  . hydrOXYzine (ATARAX/VISTARIL) 25 MG tablet    Sig: Take 25 mg by mouth.  Marland Kitchen lisinopril-hydrochlorothiazide (ZESTORETIC) 10-12.5 MG tablet    Sig: Take by mouth.  . meloxicam (MOBIC) 7.5 MG tablet    Sig: Take 7.5 mg by mouth.  . traMADol (ULTRAM) 50  MG tablet    Sig: Take 50 mg by mouth.  . triamcinolone ointment (KENALOG) 0.1 %    Sig: Apply topically.  . Dapsone (ACZONE) 5 % topical gel    Sig: Apply to face daily as needed.  . doxycycline (VIBRAMYCIN) 100 MG capsule    Sig: Take one tablet daily with food and a glass of water.  . finasteride (PROSCAR) 5 MG tablet    Sig: Take one tablet by mouth daily  . metroNIDAZOLE (METROGEL) 1 % gel    Sig: Use as needed for rosacea  . NON FORMULARY    Sig:     Diagnostics: Spirometry: FEV1 2.27L or 118%, FEV1/FVC  89%.  This is a normal study.  Physical Exam: BP 150/84 mmHg  Pulse 72  Temp(Src) 98.3 F (36.8 C) (Oral)  Resp 20  Ht 5' 1.02" (1.55 m)  Wt 181 lb 7 oz (82.3 kg)  BMI 34.26 kg/m2   Physical Exam  Constitutional: She appears well-developed and well-nourished. No distress.  HENT:  Right Ear: External ear normal.  Left Ear: External ear normal.  Nose: Nose normal.  Mouth/Throat: Oropharynx is clear and moist.  Eyes: Conjunctivae are normal. Right eye exhibits no discharge. Left eye exhibits no discharge.  Cardiovascular: Normal rate, regular rhythm and normal heart sounds.   No murmur heard. Pulmonary/Chest: Effort normal and breath sounds normal. No respiratory distress. She has no wheezes. She has no rales.  Abdominal: Soft. Bowel sounds are normal.  Musculoskeletal: She exhibits no edema.  Lymphadenopathy:    She has no cervical adenopathy.  Neurological: She is alert.  Skin: No rash noted.  Vitals reviewed.   Medications: Current outpatient prescriptions:  .  albuterol (PROVENTIL HFA;VENTOLIN HFA) 108 (90 BASE) MCG/ACT inhaler, Inhale 2 puffs into the lungs every 6 (six) hours as needed for wheezing., Disp: , Rfl:  .  Beclomethasone Dipropionate (QVAR IN), Inhale 80 mcg into the lungs daily. 2 puffs every morning to prevent cough or wheeze, Disp: , Rfl:  .  benzonatate (TESSALON) 100 MG capsule, Take 2 capsules (200 mg total) by mouth every 8 (eight)  hours., Disp: 21 capsule, Rfl: 0 .  cromolyn (NASALCROM) 5.2 MG/ACT nasal spray, Place into the nose., Disp: , Rfl:  .  Dapsone (ACZONE) 5 % topical gel, Apply to face daily as needed., Disp: , Rfl:  .  diphenhydrAMINE (BENADRYL) 50 MG tablet, Take 50 mg by mouth every 6 (six) hours as needed for itching., Disp: , Rfl:  .  doxycycline (VIBRAMYCIN) 100 MG capsule, Take one tablet daily with food and a glass of water., Disp: , Rfl:  .  EPINEPHrine (EPIPEN 2-PAK) 0.3 mg/0.3 mL IJ SOAJ injection, Inject 0.3 mg into the muscle as needed., Disp: , Rfl:  .  Famotidine (PEPCID PO), Take by mouth., Disp: , Rfl:  .  finasteride (PROSCAR) 5 MG tablet, Take one tablet by mouth daily, Disp: , Rfl:  .  fluticasone (FLONASE) 50 MCG/ACT nasal spray, 2 sprays by Each Nare route daily., Disp: , Rfl:  .  guaiFENesin-codeine (ROBITUSSIN AC) 100-10 MG/5ML syrup, Take 5 mLs by mouth 3 (three) times daily as needed for cough., Disp: 120 mL, Rfl: 0 .  hydrOXYzine (ATARAX/VISTARIL) 25 MG tablet, Take 25 mg by mouth., Disp: , Rfl:  .  ibuprofen (ADVIL,MOTRIN) 800 MG tablet, Take 1 tablet (800 mg total) by mouth every 8 (eight) hours as needed for mild pain., Disp: 30 tablet, Rfl: 0 .  levocetirizine (XYZAL) 5 MG tablet, Take 5 mg by mouth every evening., Disp: , Rfl:  .  lisinopril-hydrochlorothiazide (ZESTORETIC) 10-12.5 MG tablet, Take by mouth., Disp: , Rfl:  .  meloxicam (MOBIC) 7.5 MG tablet, Take 7.5 mg by mouth., Disp: , Rfl:  .  metroNIDAZOLE (METROGEL) 1 % gel, Use as needed for rosacea, Disp: , Rfl:  .  naproxen (NAPROSYN) 500 MG tablet, Take 1 tablet (500 mg total) by mouth 2 (two) times daily., Disp: 30 tablet, Rfl: 0 .  NON FORMULARY, , Disp: , Rfl:  .  Olopatadine HCl (PATADAY) 0.2 % SOLN, Apply 1 drop to eye daily., Disp: , Rfl:  .  traMADol (ULTRAM) 50 MG tablet, Take 50 mg by mouth., Disp: , Rfl:  .  triamcinolone ointment (KENALOG) 0.1 %, Apply topically., Disp: , Rfl:  .  HYDROcodone-acetaminophen  (NORCO/VICODIN) 5-325 MG per tablet, Take 1 tablet by mouth every 4 (four) hours as needed. (Patient not taking: Reported on 01/16/2016), Disp: 15 tablet, Rfl: 0 .  methocarbamol (ROBAXIN) 500 MG tablet, Take 1 tablet (500 mg total) by mouth 3 (three) times daily between meals as needed. (Patient not taking: Reported on 01/16/2016), Disp: 20 tablet, Rfl: 0  Drug Allergies:  Allergies  Allergen Reactions  . Ivp Dye [Iodinated Diagnostic Agents] Anaphylaxis    Cardiac Arrest  . Banana   . Biaxin [Clarithromycin]   . Latex   . Peanuts [Peanut Oil]   . Pravastatin   . Shellfish-Derived  Products   . Strawberry Extract Hives  . Sulfa Antibiotics   . Tetrofosmin [Technetium-5m]     ROS: Per HPI unless specifically indicated below Review of Systems  Thank you for the opportunity to care for this patient.  Please do not hesitate to contact me with questions.

## 2016-01-18 ENCOUNTER — Telehealth: Payer: Self-pay | Admitting: *Deleted

## 2016-01-18 ENCOUNTER — Other Ambulatory Visit: Payer: Self-pay

## 2016-01-18 MED ORDER — FLUTICASONE FUROATE 100 MCG/ACT IN AEPB: 1.0000 | INHALATION_SPRAY | Freq: Every day | RESPIRATORY_TRACT | Status: DC

## 2016-01-18 MED ORDER — BECLOMETHASONE DIPROPIONATE 80 MCG/ACT IN AERS: INHALATION_SPRAY | RESPIRATORY_TRACT | Status: DC

## 2016-01-18 NOTE — Telephone Encounter (Signed)
Lm for pt to call us back about new rx

## 2016-01-18 NOTE — Telephone Encounter (Signed)
Lets switch to Arnuity 100 mcg 1 inhalation daily

## 2016-01-18 NOTE — Telephone Encounter (Signed)
Qvar 80 is not covered. She has been on flovent hfa.

## 2016-01-18 NOTE — Telephone Encounter (Signed)
Patients insurance prefers Arnuity or Flovent, can we change?

## 2016-01-18 NOTE — Telephone Encounter (Signed)
rx sent for arnuity 100

## 2016-01-22 NOTE — Telephone Encounter (Signed)
Pharmacy called patient in regards to new rx. Advised to pick up new medication and to get the pharmacy to educate on new rx instructions given on 1 inhalation daily.

## 2016-01-24 ENCOUNTER — Ambulatory Visit (INDEPENDENT_AMBULATORY_CARE_PROVIDER_SITE_OTHER): Payer: Medicare Other | Admitting: *Deleted

## 2016-01-24 DIAGNOSIS — J309 Allergic rhinitis, unspecified: Secondary | ICD-10-CM | POA: Diagnosis not present

## 2016-01-29 ENCOUNTER — Ambulatory Visit (INDEPENDENT_AMBULATORY_CARE_PROVIDER_SITE_OTHER): Payer: Medicare Other | Admitting: *Deleted

## 2016-01-29 DIAGNOSIS — J309 Allergic rhinitis, unspecified: Secondary | ICD-10-CM

## 2016-02-05 ENCOUNTER — Ambulatory Visit (INDEPENDENT_AMBULATORY_CARE_PROVIDER_SITE_OTHER): Payer: Medicare Other

## 2016-02-05 DIAGNOSIS — J309 Allergic rhinitis, unspecified: Secondary | ICD-10-CM

## 2016-02-14 ENCOUNTER — Ambulatory Visit (INDEPENDENT_AMBULATORY_CARE_PROVIDER_SITE_OTHER): Payer: Medicare Other | Admitting: *Deleted

## 2016-02-14 DIAGNOSIS — J309 Allergic rhinitis, unspecified: Secondary | ICD-10-CM

## 2016-02-27 ENCOUNTER — Ambulatory Visit (INDEPENDENT_AMBULATORY_CARE_PROVIDER_SITE_OTHER): Payer: Medicare Other | Admitting: *Deleted

## 2016-02-27 DIAGNOSIS — J309 Allergic rhinitis, unspecified: Secondary | ICD-10-CM

## 2016-03-11 ENCOUNTER — Ambulatory Visit (INDEPENDENT_AMBULATORY_CARE_PROVIDER_SITE_OTHER): Payer: Medicare Other

## 2016-03-11 DIAGNOSIS — J309 Allergic rhinitis, unspecified: Secondary | ICD-10-CM

## 2016-03-25 ENCOUNTER — Ambulatory Visit (INDEPENDENT_AMBULATORY_CARE_PROVIDER_SITE_OTHER): Payer: Medicare Other

## 2016-03-25 DIAGNOSIS — J309 Allergic rhinitis, unspecified: Secondary | ICD-10-CM | POA: Diagnosis not present

## 2016-04-07 ENCOUNTER — Ambulatory Visit (INDEPENDENT_AMBULATORY_CARE_PROVIDER_SITE_OTHER): Payer: Medicare Other

## 2016-04-07 DIAGNOSIS — J309 Allergic rhinitis, unspecified: Secondary | ICD-10-CM | POA: Diagnosis not present

## 2016-04-15 DIAGNOSIS — J301 Allergic rhinitis due to pollen: Secondary | ICD-10-CM | POA: Diagnosis not present

## 2016-04-16 DIAGNOSIS — J3089 Other allergic rhinitis: Secondary | ICD-10-CM | POA: Diagnosis not present

## 2016-04-30 ENCOUNTER — Ambulatory Visit (INDEPENDENT_AMBULATORY_CARE_PROVIDER_SITE_OTHER): Payer: Medicare Other

## 2016-04-30 DIAGNOSIS — J309 Allergic rhinitis, unspecified: Secondary | ICD-10-CM

## 2016-05-07 ENCOUNTER — Ambulatory Visit (INDEPENDENT_AMBULATORY_CARE_PROVIDER_SITE_OTHER): Payer: Medicare Other

## 2016-05-07 DIAGNOSIS — J309 Allergic rhinitis, unspecified: Secondary | ICD-10-CM | POA: Diagnosis not present

## 2016-05-13 ENCOUNTER — Ambulatory Visit (INDEPENDENT_AMBULATORY_CARE_PROVIDER_SITE_OTHER): Payer: Medicare Other

## 2016-05-13 DIAGNOSIS — J309 Allergic rhinitis, unspecified: Secondary | ICD-10-CM

## 2016-05-21 ENCOUNTER — Ambulatory Visit (INDEPENDENT_AMBULATORY_CARE_PROVIDER_SITE_OTHER): Payer: Medicare Other

## 2016-05-21 DIAGNOSIS — J309 Allergic rhinitis, unspecified: Secondary | ICD-10-CM

## 2016-05-28 ENCOUNTER — Ambulatory Visit (INDEPENDENT_AMBULATORY_CARE_PROVIDER_SITE_OTHER): Payer: Medicare Other

## 2016-05-28 DIAGNOSIS — J309 Allergic rhinitis, unspecified: Secondary | ICD-10-CM | POA: Diagnosis not present

## 2016-06-11 ENCOUNTER — Ambulatory Visit (INDEPENDENT_AMBULATORY_CARE_PROVIDER_SITE_OTHER): Payer: Medicare Other

## 2016-06-11 DIAGNOSIS — J309 Allergic rhinitis, unspecified: Secondary | ICD-10-CM | POA: Diagnosis not present

## 2016-06-25 ENCOUNTER — Ambulatory Visit (INDEPENDENT_AMBULATORY_CARE_PROVIDER_SITE_OTHER): Payer: Medicare Other

## 2016-06-25 DIAGNOSIS — J309 Allergic rhinitis, unspecified: Secondary | ICD-10-CM | POA: Diagnosis not present

## 2016-07-08 DIAGNOSIS — J301 Allergic rhinitis due to pollen: Secondary | ICD-10-CM | POA: Diagnosis not present

## 2016-07-09 DIAGNOSIS — J3089 Other allergic rhinitis: Secondary | ICD-10-CM | POA: Diagnosis not present

## 2016-07-10 ENCOUNTER — Ambulatory Visit (INDEPENDENT_AMBULATORY_CARE_PROVIDER_SITE_OTHER): Payer: Medicare Other | Admitting: *Deleted

## 2016-07-10 DIAGNOSIS — J309 Allergic rhinitis, unspecified: Secondary | ICD-10-CM

## 2016-07-15 ENCOUNTER — Ambulatory Visit (INDEPENDENT_AMBULATORY_CARE_PROVIDER_SITE_OTHER): Payer: Medicare Other

## 2016-07-15 DIAGNOSIS — J309 Allergic rhinitis, unspecified: Secondary | ICD-10-CM | POA: Diagnosis not present

## 2016-07-30 ENCOUNTER — Ambulatory Visit (INDEPENDENT_AMBULATORY_CARE_PROVIDER_SITE_OTHER): Payer: Medicare Other | Admitting: *Deleted

## 2016-07-30 DIAGNOSIS — J309 Allergic rhinitis, unspecified: Secondary | ICD-10-CM | POA: Diagnosis not present

## 2016-08-11 ENCOUNTER — Ambulatory Visit (INDEPENDENT_AMBULATORY_CARE_PROVIDER_SITE_OTHER): Payer: Medicare Other

## 2016-08-11 DIAGNOSIS — J309 Allergic rhinitis, unspecified: Secondary | ICD-10-CM

## 2016-08-27 ENCOUNTER — Ambulatory Visit (INDEPENDENT_AMBULATORY_CARE_PROVIDER_SITE_OTHER): Payer: Medicare Other

## 2016-08-27 DIAGNOSIS — J309 Allergic rhinitis, unspecified: Secondary | ICD-10-CM | POA: Diagnosis not present

## 2016-09-10 ENCOUNTER — Ambulatory Visit (INDEPENDENT_AMBULATORY_CARE_PROVIDER_SITE_OTHER): Payer: Medicare Other

## 2016-09-10 DIAGNOSIS — J309 Allergic rhinitis, unspecified: Secondary | ICD-10-CM

## 2016-09-23 ENCOUNTER — Ambulatory Visit (INDEPENDENT_AMBULATORY_CARE_PROVIDER_SITE_OTHER): Payer: Medicare Other

## 2016-09-23 DIAGNOSIS — J309 Allergic rhinitis, unspecified: Secondary | ICD-10-CM

## 2016-10-07 ENCOUNTER — Ambulatory Visit (INDEPENDENT_AMBULATORY_CARE_PROVIDER_SITE_OTHER): Payer: Medicare Other

## 2016-10-07 DIAGNOSIS — J309 Allergic rhinitis, unspecified: Secondary | ICD-10-CM | POA: Diagnosis not present

## 2016-10-14 ENCOUNTER — Ambulatory Visit (INDEPENDENT_AMBULATORY_CARE_PROVIDER_SITE_OTHER): Payer: Medicare Other | Admitting: *Deleted

## 2016-10-14 DIAGNOSIS — J309 Allergic rhinitis, unspecified: Secondary | ICD-10-CM | POA: Diagnosis not present

## 2016-10-23 ENCOUNTER — Ambulatory Visit (INDEPENDENT_AMBULATORY_CARE_PROVIDER_SITE_OTHER): Payer: Medicare Other

## 2016-10-23 DIAGNOSIS — J309 Allergic rhinitis, unspecified: Secondary | ICD-10-CM | POA: Diagnosis not present

## 2016-10-28 ENCOUNTER — Ambulatory Visit (INDEPENDENT_AMBULATORY_CARE_PROVIDER_SITE_OTHER): Payer: Medicare Other

## 2016-10-28 DIAGNOSIS — J309 Allergic rhinitis, unspecified: Secondary | ICD-10-CM | POA: Diagnosis not present

## 2016-11-10 ENCOUNTER — Ambulatory Visit (INDEPENDENT_AMBULATORY_CARE_PROVIDER_SITE_OTHER): Payer: Medicare Other

## 2016-11-10 DIAGNOSIS — J309 Allergic rhinitis, unspecified: Secondary | ICD-10-CM | POA: Diagnosis not present

## 2016-11-17 ENCOUNTER — Ambulatory Visit (INDEPENDENT_AMBULATORY_CARE_PROVIDER_SITE_OTHER): Payer: Medicare Other

## 2016-11-17 DIAGNOSIS — J309 Allergic rhinitis, unspecified: Secondary | ICD-10-CM | POA: Diagnosis not present

## 2016-12-03 ENCOUNTER — Ambulatory Visit (INDEPENDENT_AMBULATORY_CARE_PROVIDER_SITE_OTHER): Payer: Medicare Other | Admitting: *Deleted

## 2016-12-03 DIAGNOSIS — J309 Allergic rhinitis, unspecified: Secondary | ICD-10-CM

## 2016-12-17 ENCOUNTER — Ambulatory Visit (INDEPENDENT_AMBULATORY_CARE_PROVIDER_SITE_OTHER): Payer: Medicare Other | Admitting: *Deleted

## 2016-12-17 DIAGNOSIS — J309 Allergic rhinitis, unspecified: Secondary | ICD-10-CM | POA: Diagnosis not present

## 2016-12-25 ENCOUNTER — Other Ambulatory Visit: Payer: Self-pay | Admitting: Allergy

## 2016-12-25 MED ORDER — FLUTICASONE FUROATE 100 MCG/ACT IN AEPB: 1.0000 | INHALATION_SPRAY | Freq: Every day | RESPIRATORY_TRACT | 5 refills | Status: DC

## 2016-12-26 DIAGNOSIS — J301 Allergic rhinitis due to pollen: Secondary | ICD-10-CM | POA: Diagnosis not present

## 2016-12-29 DIAGNOSIS — J3089 Other allergic rhinitis: Secondary | ICD-10-CM | POA: Diagnosis not present

## 2016-12-30 ENCOUNTER — Ambulatory Visit (INDEPENDENT_AMBULATORY_CARE_PROVIDER_SITE_OTHER): Payer: Medicare Other

## 2016-12-30 DIAGNOSIS — J309 Allergic rhinitis, unspecified: Secondary | ICD-10-CM

## 2017-01-01 NOTE — Addendum Note (Signed)
Addended by: Berniece Andreas L on: 01/01/2017 09:00 AM   Modules accepted: Orders

## 2017-01-13 ENCOUNTER — Ambulatory Visit (INDEPENDENT_AMBULATORY_CARE_PROVIDER_SITE_OTHER): Payer: Medicare Other

## 2017-01-13 DIAGNOSIS — J309 Allergic rhinitis, unspecified: Secondary | ICD-10-CM | POA: Diagnosis not present

## 2017-01-19 ENCOUNTER — Ambulatory Visit: Payer: Medicare Other | Admitting: Pediatrics

## 2017-01-21 ENCOUNTER — Ambulatory Visit (INDEPENDENT_AMBULATORY_CARE_PROVIDER_SITE_OTHER): Payer: Medicare Other

## 2017-01-21 DIAGNOSIS — J309 Allergic rhinitis, unspecified: Secondary | ICD-10-CM

## 2017-01-22 ENCOUNTER — Encounter: Payer: Self-pay | Admitting: Allergy and Immunology

## 2017-01-22 ENCOUNTER — Ambulatory Visit (INDEPENDENT_AMBULATORY_CARE_PROVIDER_SITE_OTHER): Payer: Medicare Other | Admitting: Allergy and Immunology

## 2017-01-22 VITALS — BP 120/82 | HR 72 | Temp 98.7°F | Resp 20

## 2017-01-22 DIAGNOSIS — T7800XA Anaphylactic reaction due to unspecified food, initial encounter: Secondary | ICD-10-CM

## 2017-01-22 DIAGNOSIS — J3089 Other allergic rhinitis: Secondary | ICD-10-CM

## 2017-01-22 DIAGNOSIS — J4530 Mild persistent asthma, uncomplicated: Secondary | ICD-10-CM | POA: Diagnosis not present

## 2017-01-22 DIAGNOSIS — T7800XD Anaphylactic reaction due to unspecified food, subsequent encounter: Secondary | ICD-10-CM | POA: Insufficient documentation

## 2017-01-22 DIAGNOSIS — T7809XD Anaphylactic reaction due to other food products, subsequent encounter: Secondary | ICD-10-CM | POA: Insufficient documentation

## 2017-01-22 MED ORDER — OLOPATADINE HCL 0.2 % OP SOLN
OPHTHALMIC | 5 refills | Status: DC
Start: 2017-01-22 — End: 2017-12-29

## 2017-01-22 NOTE — Progress Notes (Signed)
Follow-up Note  RE: [name] MRN: [id number] DOB: 1961/04/21 Date of Office Visit: 01/22/2017  Primary care provider: Kristen Loader Referring provider: No ref. provider found  History of present illness: Gabrielle Shaw is a 56 y.o. female with persistent asthma and allergic rhinitis presenting today for follow up. She was last seen in this clinic in January 2017 by Dr. Emilio Math, who has since left the practice.  She reports that in the interval since her previous visit her asthma has been well controlled.  Due to changes in her insurance coverage she was switched from Qvar 80 g, 2 inhalations daily, to Arnuity 100 g, one inhalation daily.  While on this regimen, she rarely requires albuterol rescue, typically only during upper respiratory tract infections, and denies nocturnal awakenings due to lower respiratory symptoms.  She has no nasal symptom complaints today with the exception of occasional dryness of the nasal mucosa.  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  She states when she consumes any these foods she develops urticaria and is well as angioedema of the lips and eyelids.  She currently avoids those foods and has access to epinephrine autoinjector in case of accidental ingestion followed by systemic symptoms.   Assessment and plan: Mild persistent asthma Well-controlled.  Continue Arnuity Ellipta, 100 g, one inhalation daily, and albuterol HFA, 1-2 inhalations every 4-6 hours as needed.  She plans to receive her flu vaccination in the near future.  Subjective and objective measures of pulmonary function will be followed and the treatment plan will be adjusted accordingly.  Other allergic rhinitis Stable.  Continue appropriate allergen avoidance measures, aeroallergen immunotherapy as prescribed and as tolerated, levocetirizine as needed, Pataday as needed, and fluticasone nasal spray as needed.  Medications will be decreased or  discontinued as symptom relief from immunotherapy becomes evident.  Food allergy  Meticulous avoidance of peanuts, shellfish, bananas, and strawberries as discussed.  Continue to have access to epinephrine auto-injector 2 pack in case of accidental ingestion.  A food allergy action plan has been provided.  Medic Alert identification is recommended.   Diagnostics: Spirometry:  Normal with an FEV1 of 106% predicted.  Please see scanned spirometry results for details.    Physical examination: Blood pressure 120/82, pulse 72, temperature 98.7 F (37.1 C), temperature source Oral, resp. rate 20.  General: Alert, interactive, in no acute distress. HEENT: TMs pearly gray, turbinates mildly edematous without discharge, post-pharynx mildly erythematous. Neck: Supple without lymphadenopathy. Lungs: Clear to auscultation without wheezing, rhonchi or rales. CV: Normal S1, S2 without murmurs. Skin: Warm and dry, without lesions or rashes.  The following portions of the patient's history were reviewed and updated as appropriate: allergies, current medications, past family history, past medical history, past social history, past surgical history and problem list.  Allergies as of 01/22/2017      Reactions   Ivp Dye [iodinated Diagnostic Agents] Anaphylaxis   Cardiac Arrest   Banana Hives   Biaxin [clarithromycin] Hives   Latex Hives   Peanuts [peanut Oil] Hives   Pravastatin Hives   Shellfish-derived Products Hives   Strawberry Extract Hives   Sulfa Antibiotics Hives   Tetrofosmin [technetium-45m] Hives      Medication List       Accurate as of 01/22/17 11:14 AM. Always use your most recent med list.          ACZONE 5 % topical gel Generic drug:  Dapsone Apply to face daily as needed.  albuterol 108 (90 Base) MCG/ACT inhaler Commonly known as:  PROVENTIL HFA;VENTOLIN HFA Inhale 2 puffs into the lungs every 4 (four) hours as needed for wheezing.   beclomethasone 80 MCG/ACT  inhaler Commonly known as:  QVAR 2 puffs every morning to prevent cough or wheeze. May increase to twice a day if asthma flare.   benzonatate 100 MG capsule Commonly known as:  TESSALON Take 2 capsules (200 mg total) by mouth every 8 (eight) hours.   cromolyn 5.2 MG/ACT nasal spray Commonly known as:  NASALCROM Place into the nose.   diphenhydrAMINE 50 MG tablet Commonly known as:  BENADRYL Take 50 mg by mouth every 6 (six) hours as needed for itching.   doxycycline 100 MG capsule Commonly known as:  VIBRAMYCIN Take one tablet daily with food and a glass of water.   EPINEPHrine 0.3 mg/0.3 mL Soaj injection Commonly known as:  EPIPEN 2-PAK USE AS DIRECTED FOR SEVERE ALLERGIC REACTION   fluticasone 50 MCG/ACT nasal spray Commonly known as:  FLONASE TWO SPRAYS EACH NOSTRIL ONCE A DAY FOR NASAL CONGESTION OR DRAINAGE.   Fluticasone Furoate 100 MCG/ACT Aepb Commonly known as:  ARNUITY ELLIPTA Inhale 1 puff into the lungs daily.   guaiFENesin-codeine 100-10 MG/5ML syrup Commonly known as:  ROBITUSSIN AC Take 5 mLs by mouth 3 (three) times daily as needed for cough.   HYDROcodone-acetaminophen 5-325 MG tablet Commonly known as:  NORCO/VICODIN Take 1 tablet by mouth every 4 (four) hours as needed.   hydrOXYzine 25 MG tablet Commonly known as:  ATARAX/VISTARIL ONE TABLET AT BEDTIME AS NEEDED FOR ITCHING.   ibuprofen 800 MG tablet Commonly known as:  ADVIL,MOTRIN Take 1 tablet (800 mg total) by mouth every 8 (eight) hours as needed for mild pain.   levocetirizine 5 MG tablet Commonly known as:  XYZAL Take 1 tablet (5 mg total) by mouth every evening.   methocarbamol 500 MG tablet Commonly known as:  ROBAXIN Take 1 tablet (500 mg total) by mouth 3 (three) times daily between meals as needed.   METROGEL 1 % gel Generic drug:  metroNIDAZOLE Use as needed for rosacea   naproxen 500 MG tablet Commonly known as:  NAPROSYN Take 1 tablet (500 mg total) by mouth 2 (two)  times daily.   NON FORMULARY   Olopatadine HCl 0.2 % Soln Commonly known as:  PATADAY One drop each eye once a day as needed for itchy eyes.   PEPCID PO Take by mouth.   PROSCAR 5 MG tablet Generic drug:  finasteride Take one tablet by mouth daily   traMADol 50 MG tablet Commonly known as:  ULTRAM Take 50 mg by mouth.   ZESTORETIC 10-12.5 MG tablet Generic drug:  lisinopril-hydrochlorothiazide Take by mouth.       Allergies  Allergen Reactions  . Ivp Dye [Iodinated Diagnostic Agents] Anaphylaxis    Cardiac Arrest  . Banana Hives  . Biaxin [Clarithromycin] Hives  . Latex Hives  . Peanuts [Peanut Oil] Hives  . Pravastatin Hives  . Shellfish-Derived Products Hives  . Strawberry Extract Hives  . Sulfa Antibiotics Hives  . Tetrofosmin [Technetium-42m] Hives   Review of systems: Review of systems negative except as noted in HPI / PMHx or noted below: Constitutional: Negative.  HENT: Negative.   Eyes: Negative.  Respiratory: Negative.   Cardiovascular: Negative.  Gastrointestinal: Negative.  Genitourinary: Negative.  Musculoskeletal: Negative.  Neurological: Negative.  Endo/Heme/Allergies: Negative.  Cutaneous: Negative.  Past Medical History:  Diagnosis Date  . Fibromyalgia   .  Joint pain   . Osteoarthritis     Family History  Problem Relation Age of Onset  . Allergic rhinitis Neg Hx   . Asthma Neg Hx   . Angioedema Neg Hx   . Eczema Neg Hx   . Immunodeficiency Neg Hx   . Urticaria Neg Hx     Social History   Social History  . Marital status: Single    Spouse name: N/A  . Number of children: N/A  . Years of education: N/A   Occupational History  . Not on file.   Social History Main Topics  . Smoking status: Never Smoker  . Smokeless tobacco: Never Used  . Alcohol use No  . Drug use: No  . Sexual activity: Yes    Birth control/ protection: Surgical   Other Topics Concern  . Not on file   Social History Narrative  . No narrative on  file    I appreciate the opportunity to take part in Jennifer's care. Please do not hesitate to contact me with questions.  Sincerely,   R. Edgar Frisk, MD

## 2017-01-22 NOTE — Assessment & Plan Note (Signed)
   Meticulous avoidance of peanuts, shellfish, bananas, and strawberries as discussed.  Continue to have access to epinephrine auto-injector 2 pack in case of accidental ingestion.  A food allergy action plan has been provided.  Medic Alert identification is recommended.

## 2017-01-22 NOTE — Assessment & Plan Note (Signed)
Well-controlled.  Continue Arnuity Ellipta, 100 g, one inhalation daily, and albuterol HFA, 1-2 inhalations every 4-6 hours as needed.  She plans to receive her flu vaccination in the near future.  Subjective and objective measures of pulmonary function will be followed and the treatment plan will be adjusted accordingly.

## 2017-01-22 NOTE — Assessment & Plan Note (Signed)
Stable.  Continue appropriate allergen avoidance measures, aeroallergen immunotherapy as prescribed and as tolerated, levocetirizine as needed, Pataday as needed, and fluticasone nasal spray as needed.  Medications will be decreased or discontinued as symptom relief from immunotherapy becomes evident.

## 2017-01-22 NOTE — Patient Instructions (Signed)
Mild persistent asthma Well-controlled.  Continue Arnuity Ellipta, 100 g, one inhalation daily, and albuterol HFA, 1-2 inhalations every 4-6 hours as needed.  She plans to receive her flu vaccination in the near future.  Subjective and objective measures of pulmonary function will be followed and the treatment plan will be adjusted accordingly.  Other allergic rhinitis Stable.  Continue appropriate allergen avoidance measures, aeroallergen immunotherapy as prescribed and as tolerated, levocetirizine as needed, Pataday as needed, and fluticasone nasal spray as needed.  Medications will be decreased or discontinued as symptom relief from immunotherapy becomes evident.  Food allergy  Meticulous avoidance of peanuts, shellfish, bananas, and strawberries as discussed.  Continue to have access to epinephrine auto-injector 2 pack in case of accidental ingestion.  A food allergy action plan has been provided.  Medic Alert identification is recommended.   Return in about 6 months (around 07/22/2017), or if symptoms worsen or fail to improve.

## 2017-01-26 ENCOUNTER — Ambulatory Visit: Payer: Medicare Other

## 2017-01-26 ENCOUNTER — Ambulatory Visit (INDEPENDENT_AMBULATORY_CARE_PROVIDER_SITE_OTHER): Payer: Medicare Other

## 2017-01-26 DIAGNOSIS — Z23 Encounter for immunization: Secondary | ICD-10-CM

## 2017-01-28 ENCOUNTER — Ambulatory Visit: Payer: Medicare Other | Admitting: Allergy and Immunology

## 2017-01-30 ENCOUNTER — Ambulatory Visit (INDEPENDENT_AMBULATORY_CARE_PROVIDER_SITE_OTHER): Payer: Medicare Other

## 2017-01-30 DIAGNOSIS — J309 Allergic rhinitis, unspecified: Secondary | ICD-10-CM | POA: Diagnosis not present
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
## 2017-02-02 ENCOUNTER — Ambulatory Visit (INDEPENDENT_AMBULATORY_CARE_PROVIDER_SITE_OTHER): Payer: Medicare Other

## 2017-02-02 DIAGNOSIS — J309 Allergic rhinitis, unspecified: Secondary | ICD-10-CM | POA: Diagnosis not present

## 2017-02-09 ENCOUNTER — Ambulatory Visit (INDEPENDENT_AMBULATORY_CARE_PROVIDER_SITE_OTHER): Payer: Medicare Other

## 2017-02-09 DIAGNOSIS — J309 Allergic rhinitis, unspecified: Secondary | ICD-10-CM

## 2017-02-18 ENCOUNTER — Ambulatory Visit (INDEPENDENT_AMBULATORY_CARE_PROVIDER_SITE_OTHER): Payer: Medicare Other | Admitting: *Deleted

## 2017-02-18 DIAGNOSIS — J309 Allergic rhinitis, unspecified: Secondary | ICD-10-CM

## 2017-02-25 ENCOUNTER — Ambulatory Visit (INDEPENDENT_AMBULATORY_CARE_PROVIDER_SITE_OTHER): Payer: Medicare Other | Admitting: *Deleted

## 2017-02-25 DIAGNOSIS — J309 Allergic rhinitis, unspecified: Secondary | ICD-10-CM

## 2017-02-27 ENCOUNTER — Other Ambulatory Visit: Payer: Self-pay

## 2017-02-27 ENCOUNTER — Telehealth: Payer: Self-pay

## 2017-02-27 MED ORDER — FLUTICASONE PROPIONATE HFA 110 MCG/ACT IN AERO: 2.0000 | INHALATION_SPRAY | Freq: Two times a day (BID) | RESPIRATORY_TRACT | 5 refills | Status: DC

## 2017-02-27 MED ORDER — FLUTICASONE PROPIONATE 50 MCG/ACT NA SUSP: NASAL | 5 refills | Status: DC

## 2017-02-27 NOTE — Telephone Encounter (Signed)
Spoke with pt about [place] changing her qvar to flovent.

## 2017-03-03 ENCOUNTER — Other Ambulatory Visit: Payer: Self-pay | Admitting: Allergy

## 2017-03-03 MED ORDER — BECLOMETHASONE DIPROP HFA 80 MCG/ACT IN AERB: 2.0000 | INHALATION_SPRAY | Freq: Every day | RESPIRATORY_TRACT | 5 refills | Status: DC

## 2017-03-09 ENCOUNTER — Ambulatory Visit (INDEPENDENT_AMBULATORY_CARE_PROVIDER_SITE_OTHER): Payer: Medicare Other

## 2017-03-09 ENCOUNTER — Other Ambulatory Visit: Payer: Self-pay | Admitting: Allergy

## 2017-03-09 DIAGNOSIS — J309 Allergic rhinitis, unspecified: Secondary | ICD-10-CM | POA: Diagnosis not present

## 2017-03-09 MED ORDER — FLUTICASONE PROPIONATE HFA 110 MCG/ACT IN AERO: 2.0000 | INHALATION_SPRAY | Freq: Once | RESPIRATORY_TRACT | 5 refills | Status: DC

## 2017-03-12 ENCOUNTER — Encounter: Payer: Self-pay | Admitting: *Deleted

## 2017-03-15 DIAGNOSIS — J301 Allergic rhinitis due to pollen: Secondary | ICD-10-CM | POA: Diagnosis not present

## 2017-03-16 DIAGNOSIS — J3089 Other allergic rhinitis: Secondary | ICD-10-CM | POA: Diagnosis not present

## 2017-03-24 ENCOUNTER — Ambulatory Visit (INDEPENDENT_AMBULATORY_CARE_PROVIDER_SITE_OTHER): Payer: Medicare Other

## 2017-03-24 DIAGNOSIS — J309 Allergic rhinitis, unspecified: Secondary | ICD-10-CM

## 2017-04-10 ENCOUNTER — Ambulatory Visit (INDEPENDENT_AMBULATORY_CARE_PROVIDER_SITE_OTHER): Payer: Medicare Other

## 2017-04-10 DIAGNOSIS — J309 Allergic rhinitis, unspecified: Secondary | ICD-10-CM

## 2017-04-20 ENCOUNTER — Ambulatory Visit (INDEPENDENT_AMBULATORY_CARE_PROVIDER_SITE_OTHER): Payer: Medicare Other

## 2017-04-20 DIAGNOSIS — J309 Allergic rhinitis, unspecified: Secondary | ICD-10-CM

## 2017-05-07 ENCOUNTER — Ambulatory Visit (INDEPENDENT_AMBULATORY_CARE_PROVIDER_SITE_OTHER): Payer: Medicare Other | Admitting: *Deleted

## 2017-05-07 DIAGNOSIS — J309 Allergic rhinitis, unspecified: Secondary | ICD-10-CM | POA: Diagnosis not present

## 2017-05-22 ENCOUNTER — Ambulatory Visit (INDEPENDENT_AMBULATORY_CARE_PROVIDER_SITE_OTHER): Payer: Medicare Other

## 2017-05-22 ENCOUNTER — Other Ambulatory Visit: Payer: Self-pay

## 2017-05-22 DIAGNOSIS — J309 Allergic rhinitis, unspecified: Secondary | ICD-10-CM

## 2017-05-22 MED ORDER — GUAIFENESIN-CODEINE 100-10 MG/5ML PO SYRP: 5.0000 mL | ORAL_SOLUTION | Freq: Three times a day (TID) | ORAL | 0 refills | Status: DC | PRN

## 2017-05-25 ENCOUNTER — Ambulatory Visit (INDEPENDENT_AMBULATORY_CARE_PROVIDER_SITE_OTHER): Payer: Medicare Other

## 2017-05-25 DIAGNOSIS — J309 Allergic rhinitis, unspecified: Secondary | ICD-10-CM | POA: Diagnosis not present

## 2017-05-26 ENCOUNTER — Other Ambulatory Visit: Payer: Self-pay

## 2017-06-02 ENCOUNTER — Ambulatory Visit (INDEPENDENT_AMBULATORY_CARE_PROVIDER_SITE_OTHER): Payer: Medicare Other

## 2017-06-02 DIAGNOSIS — J309 Allergic rhinitis, unspecified: Secondary | ICD-10-CM

## 2017-06-09 ENCOUNTER — Ambulatory Visit (INDEPENDENT_AMBULATORY_CARE_PROVIDER_SITE_OTHER): Payer: Medicare Other

## 2017-06-09 DIAGNOSIS — J309 Allergic rhinitis, unspecified: Secondary | ICD-10-CM | POA: Diagnosis not present

## 2017-06-17 ENCOUNTER — Ambulatory Visit (INDEPENDENT_AMBULATORY_CARE_PROVIDER_SITE_OTHER): Payer: Medicare Other

## 2017-06-17 DIAGNOSIS — J309 Allergic rhinitis, unspecified: Secondary | ICD-10-CM

## 2017-06-29 ENCOUNTER — Ambulatory Visit (INDEPENDENT_AMBULATORY_CARE_PROVIDER_SITE_OTHER): Payer: Medicare Other

## 2017-06-29 DIAGNOSIS — J309 Allergic rhinitis, unspecified: Secondary | ICD-10-CM | POA: Diagnosis not present

## 2017-07-09 ENCOUNTER — Ambulatory Visit (INDEPENDENT_AMBULATORY_CARE_PROVIDER_SITE_OTHER): Payer: Medicare Other

## 2017-07-09 DIAGNOSIS — J309 Allergic rhinitis, unspecified: Secondary | ICD-10-CM | POA: Diagnosis not present

## 2017-07-22 ENCOUNTER — Telehealth: Payer: Self-pay

## 2017-07-22 ENCOUNTER — Encounter: Payer: Self-pay | Admitting: Allergy and Immunology

## 2017-07-22 ENCOUNTER — Ambulatory Visit (INDEPENDENT_AMBULATORY_CARE_PROVIDER_SITE_OTHER): Payer: Medicare Other | Admitting: Allergy and Immunology

## 2017-07-22 VITALS — BP 130/100 | HR 63 | Temp 98.2°F | Resp 20

## 2017-07-22 DIAGNOSIS — I10 Essential (primary) hypertension: Secondary | ICD-10-CM

## 2017-07-22 DIAGNOSIS — J3089 Other allergic rhinitis: Secondary | ICD-10-CM | POA: Diagnosis not present

## 2017-07-22 DIAGNOSIS — T7800XD Anaphylactic reaction due to unspecified food, subsequent encounter: Secondary | ICD-10-CM

## 2017-07-22 DIAGNOSIS — J4530 Mild persistent asthma, uncomplicated: Secondary | ICD-10-CM | POA: Diagnosis not present

## 2017-07-22 DIAGNOSIS — L299 Pruritus, unspecified: Secondary | ICD-10-CM | POA: Diagnosis not present

## 2017-07-22 MED ORDER — FLUTICASONE FUROATE 100 MCG/ACT IN AEPB: 1.0000 | INHALATION_SPRAY | Freq: Every day | RESPIRATORY_TRACT | 5 refills | Status: DC

## 2017-07-22 NOTE — Telephone Encounter (Signed)
Noted. Thanks.

## 2017-07-22 NOTE — Assessment & Plan Note (Signed)
Blood pressure reading was elevated today.  The patient has been made aware of the elevated blood pressure reading and has been encouraged to follow up with her primary care physician in the near future regarding this issue.  Gabrielle Shaw has verbalized understanding and agreed to do so.  Continue antihypertensives as prescribed.

## 2017-07-22 NOTE — Assessment & Plan Note (Addendum)
Most likely due to dry skin.  Continue coconut oil to moisturize the skin, cetirizine during the day if needed, and hydroxyzine at nighttime if needed.

## 2017-07-22 NOTE — Assessment & Plan Note (Signed)
Stable.  Continue appropriate allergen avoidance measures, aeroallergen immunotherapy injections, cetirizine as needed, nasal saline as needed, and fluticasone nasal spray as needed.  Medications will be decreased or discontinued as symptom relief from immunotherapy becomes evident.

## 2017-07-22 NOTE — Telephone Encounter (Signed)
Called patient.  She did take Lisinopril 10 mg this morning.  She will follow up with her primary care physician this afternoon.

## 2017-07-22 NOTE — Assessment & Plan Note (Signed)
   Continue careful avoidance of peanuts, shellfish, bananas, and strawberries as discussed.  Continue to have access to epinephrine auto-injector 2 pack in case of accidental ingestion.  Food allergy action plan is in place.

## 2017-07-22 NOTE — Patient Instructions (Signed)
Mild persistent asthma Well-controlled with current treatment plan.  For now, continue Arnuity Ellipta, 100 g, one inhalation daily, and albuterol HFA, 1-2 inhalations every 4-6 hours as needed.  Subjective and objective measures of pulmonary function will be followed and the treatment plan will be adjusted accordingly.  Other allergic rhinitis Stable.  Continue appropriate allergen avoidance measures, aeroallergen immunotherapy injections, cetirizine as needed, nasal saline as needed, and fluticasone nasal spray as needed.  Medications will be decreased or discontinued as symptom relief from immunotherapy becomes evident.  Food allergy  Continue careful avoidance of peanuts, shellfish, bananas, and strawberries as discussed.  Continue to have access to epinephrine auto-injector 2 pack in case of accidental ingestion.  Food allergy action plan is in place.  Pruritus Most likely due to dry skin.  Continue coconut oil to moisturize the skin, cetirizine during the day if needed, and hydroxyzine at nighttime if needed.  Hypertension Blood pressure reading was elevated today.  The patient has been made aware of the elevated blood pressure reading and has been encouraged to follow up with her primary care physician in the near future regarding this issue.  Gabrielle Shaw has verbalized understanding and agreed to do so.  Continue antihypertensives as prescribed.   Return in about 4 months (around 11/21/2017), or if symptoms worsen or fail to improve.

## 2017-07-22 NOTE — Progress Notes (Signed)
Follow-up Note  RE: Gabrielle Shaw MRN: [id number] DOB: 1961/08/22 Date of Office Visit: 07/22/2017  Primary care provider: Kristen Shaw Referring provider: No ref. provider found  History of present illness: Gabrielle Shaw is a 56 y.o. female with persistent asthma and allergic rhinitis on immunotherapy presenting today for follow up. She was last seen in this clinic on 01/22/2017.  She reports that her asthma has been well controlled while taking Arnuity Ellipta 100 mcg, one inhalation daily.  She rarely requires albuterol rescue and experiences nocturnal awakenings due to lower respiratory symptoms one or 2 times per month on average.  She is receiving aeroallergen immunotherapy injections without problems or complications.  She experiences occasional nasal congestion, however this symptoms can be managed with saline and/or fluticasone nasal spray as needed.  She has been taking hydroxyzine for pruritus which she had been experiencing at nighttime.  The pruritus has improved since switching from moisturizing lotion to coconut oil to keep the skin moist.  She avoids shellfish, all nuts, bananas, and strawberries.  She has epinephrine autoinjectors in case of accidental ingestion followed by systemic symptoms.   Assessment and plan: Mild persistent asthma Well-controlled with current treatment plan.  For now, continue Arnuity Ellipta, 100 g, one inhalation daily, and albuterol HFA, 1-2 inhalations every 4-6 hours as needed.  Subjective and objective measures of pulmonary function will be followed and the treatment plan will be adjusted accordingly.  Other allergic rhinitis Stable.  Continue appropriate allergen avoidance measures, aeroallergen immunotherapy injections, cetirizine as needed, nasal saline as needed, and fluticasone nasal spray as needed.  Medications will be decreased or discontinued as symptom relief from immunotherapy becomes evident.  Food allergy  Continue  careful avoidance of peanuts, shellfish, bananas, and strawberries as discussed.  Continue to have access to epinephrine auto-injector 2 pack in case of accidental ingestion.  Food allergy action plan is in place.  Pruritus Most likely due to dry skin.  Continue coconut oil to moisturize the skin, cetirizine during the day if needed, and hydroxyzine at nighttime if needed.  Hypertension Blood pressure reading was elevated today.  The patient has been made aware of the elevated blood pressure reading and has been encouraged to follow up with her primary care physician in the near future regarding this issue.  Gabrielle Shaw has verbalized understanding and agreed to do so.  Continue antihypertensives as prescribed.   Meds ordered this encounter  Medications  . Fluticasone Furoate (ARNUITY ELLIPTA) 100 MCG/ACT AEPB    Sig: Inhale 1 puff into the lungs daily.    Dispense:  30 each    Refill:  5    Diagnostics: Spirometry:  Normal with an FEV1 of 115% predicted.  Please see scanned spirometry results for details.    Physical examination: Blood pressure (!) 130/100, pulse 63, temperature 98.2 F (36.8 C), temperature source Oral, resp. rate 20, SpO2 95 %.  General: Alert, interactive, in no acute distress. HEENT: TMs pearly gray, turbinates minimally edematous without discharge, post-pharynx unremarkable. Neck: Supple without lymphadenopathy. Lungs: Clear to auscultation without wheezing, rhonchi or rales. CV: Normal S1, S2 without murmurs. Skin: Warm and dry, without lesions or rashes.  The following portions of the patient's history were reviewed and updated as appropriate: allergies, current medications, past family history, past medical history, past social history, past surgical history and problem list.  Allergies as of 07/22/2017      Reactions   Ivp Dye [iodinated Diagnostic Agents] Anaphylaxis   Cardiac Arrest   Sulfasalazine  Hives   Tetrofosmin [technetium-86m] Hives,  Anaphylaxis   Banana Hives   Strawberry Extract Hives   Sulfamethoxazole Rash   Biaxin [clarithromycin] Hives   Latex Hives   Peanuts [peanut Oil] Hives   Pravastatin Hives   Shellfish-derived Products Hives   Sulfa Antibiotics Hives      Medication List       Accurate as of 07/22/17 12:45 PM. Always use your most recent med list.          ACZONE 5 % topical gel Generic drug:  Dapsone Apply to face daily as needed.   albuterol 108 (90 Base) MCG/ACT inhaler Commonly known as:  PROVENTIL HFA;VENTOLIN HFA Inhale 2 puffs into the lungs every 4 (four) hours as needed for wheezing.   albuterol (2.5 MG/3ML) 0.083% nebulizer solution Commonly known as:  PROVENTIL 2.5 mg as needed.   aspirin EC 81 MG tablet Take 81 mg by mouth daily.   beclomethasone 80 MCG/ACT inhaler Commonly known as:  QVAR 2 puffs every morning to prevent cough or wheeze. May increase to twice a day if asthma flare.   beclomethasone 80 MCG/ACT inhaler Commonly known as:  QVAR REDIHALER Inhale 2 puffs into the lungs daily. May increase to twice daily if asthma flare   benzonatate 100 MG capsule Commonly known as:  TESSALON Take 2 capsules (200 mg total) by mouth every 8 (eight) hours.   cromolyn 5.2 MG/ACT nasal spray Commonly known as:  NASALCROM Place into the nose.   desonide 0.05 % ointment Commonly known as:  DESOWEN Apply to the scalp once daily as needed for itching.   diphenhydrAMINE 50 MG tablet Commonly known as:  BENADRYL Take 50 mg by mouth every 6 (six) hours as needed for itching.   doxycycline 100 MG capsule Commonly known as:  VIBRAMYCIN Take one tablet daily with food and a glass of water.   EPINEPHrine 0.3 mg/0.3 mL Soaj injection Commonly known as:  EPIPEN 2-PAK USE AS DIRECTED FOR SEVERE ALLERGIC REACTION   fluocinonide ointment 0.05 % Commonly known as:  LIDEX Apply to the scalp 2-3 times per week. Do not use on the face.   fluticasone 110 MCG/ACT inhaler Commonly  known as:  FLOVENT HFA Inhale 2 puffs into the lungs 2 (two) times daily.   fluticasone 110 MCG/ACT inhaler Commonly known as:  FLOVENT HFA Inhale 2 puffs into the lungs once. TWO PUFFS INTO LUNGS ONCE A DAY.   fluticasone 50 MCG/ACT nasal spray Commonly known as:  FLONASE TWO SPRAYS EACH NOSTRIL ONCE A DAY FOR NASAL CONGESTION OR DRAINAGE.   Fluticasone Furoate 100 MCG/ACT Aepb Commonly known as:  ARNUITY ELLIPTA Inhale 1 puff into the lungs daily.   guaiFENesin-codeine 100-10 MG/5ML syrup Commonly known as:  ROBITUSSIN AC Take 5 mLs by mouth 3 (three) times daily as needed for cough.   HYDROcodone-acetaminophen 5-325 MG tablet Commonly known as:  NORCO/VICODIN Take 1 tablet by mouth every 4 (four) hours as needed.   hydrOXYzine 25 MG tablet Commonly known as:  ATARAX/VISTARIL ONE TABLET AT BEDTIME AS NEEDED FOR ITCHING.   ibuprofen 800 MG tablet Commonly known as:  ADVIL,MOTRIN Take 1 tablet (800 mg total) by mouth every 8 (eight) hours as needed for mild pain.   levocetirizine 5 MG tablet Commonly known as:  XYZAL Take 1 tablet (5 mg total) by mouth every evening.   lisinopril 10 MG tablet Commonly known as:  PRINIVIL,ZESTRIL Take 5 mg by mouth daily.   meloxicam 7.5 MG tablet Commonly known  as:  MOBIC Take 7.5 mg by mouth as needed.   methocarbamol 500 MG tablet Commonly known as:  ROBAXIN Take 1 tablet (500 mg total) by mouth 3 (three) times daily between meals as needed.   METROGEL 1 % gel Generic drug:  metroNIDAZOLE Use as needed for rosacea   naproxen 500 MG tablet Commonly known as:  NAPROSYN Take 1 tablet (500 mg total) by mouth 2 (two) times daily.   NON FORMULARY   Olopatadine HCl 0.2 % Soln Apply 1 drop to eye 2 (two) times daily.   Olopatadine HCl 0.2 % Soln Commonly known as:  PATADAY One drop each eye once a day as needed for itchy eyes.   PEPCID PO Take by mouth.   predniSONE 20 MG tablet Commonly known as:  DELTASONE Take 40 mg  by mouth as needed.   PROSCAR 5 MG tablet Generic drug:  finasteride Take one tablet by mouth daily   traMADol 50 MG tablet Commonly known as:  ULTRAM Take 50 mg by mouth.   ZESTORETIC 10-12.5 MG tablet Generic drug:  lisinopril-hydrochlorothiazide Take by mouth.       Allergies  Allergen Reactions  . Ivp Dye [Iodinated Diagnostic Agents] Anaphylaxis    Cardiac Arrest  . Sulfasalazine Hives  . Tetrofosmin [Technetium-63m] Hives and Anaphylaxis  . Banana Hives  . Strawberry Extract Hives  . Sulfamethoxazole Rash  . Biaxin [Clarithromycin] Hives  . Latex Hives  . Peanuts [Peanut Oil] Hives  . Pravastatin Hives  . Shellfish-Derived Products Hives  . Sulfa Antibiotics Hives   Review of systems: Review of systems negative except as noted in HPI / PMHx or noted below: Constitutional: Negative.  HENT: Negative.   Eyes: Negative.  Respiratory: Negative.   Cardiovascular: Negative.  Gastrointestinal: Negative.  Genitourinary: Negative.  Musculoskeletal: Negative.  Neurological: Negative.  Endo/Heme/Allergies: Negative.  Cutaneous: Negative.  Past Medical History:  Diagnosis Date  . Fibromyalgia   . Joint pain   . Osteoarthritis     Family History  Problem Relation Age of Onset  . Allergic rhinitis Neg Hx   . Asthma Neg Hx   . Angioedema Neg Hx   . Eczema Neg Hx   . Immunodeficiency Neg Hx   . Urticaria Neg Hx     Social History   Social History  . Marital status: Single    Spouse name: N/A  . Number of children: N/A  . Years of education: N/A   Occupational History  . Not on file.   Social History Main Topics  . Smoking status: Never Smoker  . Smokeless tobacco: Never Used  . Alcohol use No  . Drug use: No  . Sexual activity: Yes    Birth control/ protection: Surgical   Other Topics Concern  . Not on file   Social History Narrative  . No narrative on file    I appreciate the opportunity to take part in Loukisha's care. Please do not  hesitate to contact me with questions.  Sincerely,   R. Edgar Frisk, MD

## 2017-07-22 NOTE — Telephone Encounter (Signed)
-----   Message from Adelina Mings, MD sent at 07/22/2017 12:43 PM EDT ----- Please call the patient to inform her that her blood pressure was elevated today (130/100).  Please check to make sure that she is taking her antihypertensive medications as prescribed and please have her follow up with her primary care physician in the near future regarding this issue.  Thanks.

## 2017-07-22 NOTE — Assessment & Plan Note (Signed)
Well-controlled with current treatment plan.  For now, continue Arnuity Ellipta, 100 g, one inhalation daily, and albuterol HFA, 1-2 inhalations every 4-6 hours as needed.  Subjective and objective measures of pulmonary function will be followed and the treatment plan will be adjusted accordingly.

## 2017-07-27 DIAGNOSIS — J3089 Other allergic rhinitis: Secondary | ICD-10-CM | POA: Diagnosis not present

## 2017-07-27 NOTE — Progress Notes (Signed)
VIALS EXP 07-28-18

## 2017-07-28 DIAGNOSIS — J301 Allergic rhinitis due to pollen: Secondary | ICD-10-CM | POA: Diagnosis not present

## 2017-08-05 ENCOUNTER — Ambulatory Visit (INDEPENDENT_AMBULATORY_CARE_PROVIDER_SITE_OTHER): Payer: Medicare Other

## 2017-08-05 DIAGNOSIS — J309 Allergic rhinitis, unspecified: Secondary | ICD-10-CM

## 2017-08-19 ENCOUNTER — Ambulatory Visit (INDEPENDENT_AMBULATORY_CARE_PROVIDER_SITE_OTHER): Payer: Medicare Other

## 2017-08-19 DIAGNOSIS — J309 Allergic rhinitis, unspecified: Secondary | ICD-10-CM | POA: Diagnosis not present

## 2017-08-31 ENCOUNTER — Ambulatory Visit (INDEPENDENT_AMBULATORY_CARE_PROVIDER_SITE_OTHER): Payer: Medicare Other

## 2017-08-31 DIAGNOSIS — J309 Allergic rhinitis, unspecified: Secondary | ICD-10-CM

## 2017-09-14 ENCOUNTER — Ambulatory Visit (INDEPENDENT_AMBULATORY_CARE_PROVIDER_SITE_OTHER): Payer: Medicare Other

## 2017-09-14 DIAGNOSIS — J309 Allergic rhinitis, unspecified: Secondary | ICD-10-CM

## 2017-09-17 ENCOUNTER — Other Ambulatory Visit: Payer: Self-pay | Admitting: Allergy & Immunology

## 2017-09-22 ENCOUNTER — Other Ambulatory Visit: Payer: Self-pay

## 2017-09-22 ENCOUNTER — Ambulatory Visit (INDEPENDENT_AMBULATORY_CARE_PROVIDER_SITE_OTHER): Payer: Medicare Other | Admitting: *Deleted

## 2017-09-22 ENCOUNTER — Telehealth: Payer: Self-pay | Admitting: *Deleted

## 2017-09-22 DIAGNOSIS — J309 Allergic rhinitis, unspecified: Secondary | ICD-10-CM

## 2017-09-22 MED ORDER — BENZONATATE 100 MG PO CAPS: 200.0000 mg | ORAL_CAPSULE | Freq: Three times a day (TID) | ORAL | 0 refills | Status: DC

## 2017-09-22 MED ORDER — GUAIFENESIN-CODEINE 100-10 MG/5ML PO SYRP: 5.0000 mL | ORAL_SOLUTION | Freq: Three times a day (TID) | ORAL | 0 refills | Status: DC | PRN

## 2017-09-22 NOTE — Telephone Encounter (Signed)
Yes, one fill with no refills. Thanks.

## 2017-09-22 NOTE — Telephone Encounter (Signed)
Printed rx's for dr Verlin Fester to sign and lm for pt to call us back

## 2017-09-22 NOTE — Telephone Encounter (Signed)
Pt came in for allergy injections today, she wants tessalon capsules refilled along with robitussin refilled. Informed her robitussin had to be a handwritten rx and that Dr. Verlin Fester would be in clinic tomorrow. She informed me after injections that she had a cough x 1 week and has been using her inhalers. Okay to refill?

## 2017-09-29 ENCOUNTER — Telehealth: Payer: Self-pay | Admitting: Allergy and Immunology

## 2017-09-29 DIAGNOSIS — T7840XD Allergy, unspecified, subsequent encounter: Secondary | ICD-10-CM

## 2017-09-30 ENCOUNTER — Encounter: Payer: Self-pay | Admitting: Allergy and Immunology

## 2017-09-30 ENCOUNTER — Ambulatory Visit (INDEPENDENT_AMBULATORY_CARE_PROVIDER_SITE_OTHER): Payer: Medicare Other | Admitting: Allergy and Immunology

## 2017-09-30 VITALS — BP 114/80 | HR 74 | Temp 99.6°F | Resp 20

## 2017-09-30 DIAGNOSIS — T7800XD Anaphylactic reaction due to unspecified food, subsequent encounter: Secondary | ICD-10-CM | POA: Diagnosis not present

## 2017-09-30 DIAGNOSIS — T7840XA Allergy, unspecified, initial encounter: Secondary | ICD-10-CM

## 2017-09-30 DIAGNOSIS — J3089 Other allergic rhinitis: Secondary | ICD-10-CM

## 2017-09-30 DIAGNOSIS — J4530 Mild persistent asthma, uncomplicated: Secondary | ICD-10-CM | POA: Diagnosis not present

## 2017-09-30 DIAGNOSIS — R05 Cough: Secondary | ICD-10-CM

## 2017-09-30 DIAGNOSIS — R059 Cough, unspecified: Secondary | ICD-10-CM

## 2017-09-30 MED ORDER — FLUTICASONE PROPIONATE HFA 110 MCG/ACT IN AERO: INHALATION_SPRAY | RESPIRATORY_TRACT | 5 refills | Status: DC

## 2017-09-30 MED ORDER — BENZONATATE 100 MG PO CAPS: ORAL_CAPSULE | ORAL | 1 refills | Status: DC

## 2017-09-30 NOTE — Assessment & Plan Note (Signed)
   A prescription has been provided for Flovent 110 g, 2 inhalations twice a day. To maximize pulmonary deposition, a spacer has been provided along with instructions for its proper administration with an HFA inhaler.  During respiratory tract infections or asthma flares, increase Flovent 110g to 3 inhalations 3 times per day until symptoms have returned to baseline.  Continue albuterol HFA, 1-2 inhalations every 4-6 hours if needed.  Subjective and objective measures of pulmonary function will be followed and the treatment plan will be adjusted accordingly.

## 2017-09-30 NOTE — Assessment & Plan Note (Signed)
The patient's history suggests allergic reaction secondary to influenza vaccination.  She is not aware of having an egg allergy.  A lab order form has been provided for serum specific IgE against egg and egg components.  Prior to receiving the flu vaccination next year, we will skin test to the vaccine itself and, if negative, will provide the vaccination in a split dose with observation.

## 2017-09-30 NOTE — Assessment & Plan Note (Signed)
Most likely multifactorial with contribution from his nasal drainage and acid reflux.   A prescription has been provided for benzonatate 100 mg, 1-2 tablets every 8 hours if needed.  Treatment plan as outlined below.

## 2017-09-30 NOTE — Assessment & Plan Note (Signed)
   Continue strict avoidance of peanuts, shellfish, bananas, and strawberries as discussed and have access to epinephrine auto-injector 2 pack.  Food allergy action plan is in place. 

## 2017-09-30 NOTE — Assessment & Plan Note (Signed)
Stable.  Continue appropriate allergen avoidance measures, aeroallergen immunotherapy injections, nasal saline as needed, and fluticasone nasal spray as needed.  For thick post nasal drainage, add guaifenesin 600-1200 mg (Mucinex)  twice daily as needed with adequate hydration as discussed.  Medications will be decreased or discontinued as symptom relief from immunotherapy becomes evident. 

## 2017-09-30 NOTE — Patient Instructions (Addendum)
Allergic reaction The patient's history suggests allergic reaction secondary to influenza vaccination.  She is not aware of having an egg allergy.  A lab order form has been provided for serum specific IgE against egg and egg components.  Prior to receiving the flu vaccination next year, we will skin test to the vaccine itself and, if negative, will provide the vaccination in a split dose with observation.  Cough Most likely multifactorial with contribution from his nasal drainage and acid reflux.   A prescription has been provided for benzonatate 100 mg, 1-2 tablets every 8 hours if needed.  Treatment plan as outlined below.    Mild persistent asthma  A prescription has been provided for Flovent 110 g, 2 inhalations twice a day. To maximize pulmonary deposition, a spacer has been provided along with instructions for its proper administration with an HFA inhaler.  During respiratory tract infections or asthma flares, increase Flovent 110g to 3 inhalations 3 times per day until symptoms have returned to baseline.  Continue albuterol HFA, 1-2 inhalations every 4-6 hours if needed.  Subjective and objective measures of pulmonary function will be followed and the treatment plan will be adjusted accordingly.  Other allergic rhinitis Stable.  Continue appropriate allergen avoidance measures, aeroallergen immunotherapy injections, nasal saline as needed, and fluticasone nasal spray as needed.  For thick post nasal drainage, add guaifenesin [phone] mg (Mucinex)  twice daily as needed with adequate hydration as discussed.  Medications will be decreased or discontinued as symptom relief from immunotherapy becomes evident.  Food allergy  Continue strict avoidance of peanuts, shellfish, bananas, and strawberries as discussed and have access to epinephrine auto-injector 2 pack.  Food allergy action plan is in place.   When lab results have returned the patient will be called with  further recommendations and follow up instructions.

## 2017-09-30 NOTE — Progress Notes (Signed)
Follow-up Note  RE: Gabrielle Shaw MRN: [id number] DOB: Nov 13, 1961 Date of Office Visit: 09/30/2017  Primary care provider: Kristen Loader Referring provider: No ref. provider found  History of present illness: Gabrielle Shaw is a 56 y.o. female with persistent asthma, allergic rhinitis, and food allergies presenting today for follow up.  She was last seen in this clinic on 07/22/2017.  She reports that she received the flu vaccine on Monday and after the injection experienced localized soreness at the injection site, became dizzy, felt like she was going to pass out, states that her "heart was racing", and vomited multiple times while still in the doctor's office.  She was given diphenhydramine and her symptoms gradually improved, though she states that she still feels weak 2 days after the injection.  She receives the flu vaccination yearly, however this is the first time she has ever had a problem with the injection.  She is not aware of any problems with the consumption of egg, however she states that she does not eat eggs very often and is planning to avoid this food until egg allergy has been ruled out.  Evita complains of a cough which she has been experiencing over the past few weeks.  The cough feels like it originates from the base of her throat.  She admits to some increased postnasal drainage and reflux recently.  She has not been experiencing over chest tightness, dyspnea, or wheezing.  She is currently taking Arnuity, however states that when she has had the cough in the past the cough has done better with Flovent than with Arnuity.  Other than postnasal drainage, she does not have any nasal symptom complaints today.   Assessment and plan: Allergic reaction The patient's history suggests allergic reaction secondary to influenza vaccination.  She is not aware of having an egg allergy.  A lab order form has been provided for serum specific IgE against egg and egg components.  Prior  to receiving the flu vaccination next year, we will skin test to the vaccine itself and, if negative, will provide the vaccination in a split dose with observation.  Cough Most likely multifactorial with contribution from his nasal drainage and acid reflux.   A prescription has been provided for benzonatate 100 mg, 1-2 tablets every 8 hours if needed.  Treatment plan as outlined below.    Mild persistent asthma  A prescription has been provided for Flovent 110 g, 2 inhalations twice a day. To maximize pulmonary deposition, a spacer has been provided along with instructions for its proper administration with an HFA inhaler.  During respiratory tract infections or asthma flares, increase Flovent 110g to 3 inhalations 3 times per day until symptoms have returned to baseline.  Continue albuterol HFA, 1-2 inhalations every 4-6 hours if needed.  Subjective and objective measures of pulmonary function will be followed and the treatment plan will be adjusted accordingly.  Other allergic rhinitis Stable.  Continue appropriate allergen avoidance measures, aeroallergen immunotherapy injections, nasal saline as needed, and fluticasone nasal spray as needed.  For thick post nasal drainage, add guaifenesin [phone] mg (Mucinex)  twice daily as needed with adequate hydration as discussed.  Medications will be decreased or discontinued as symptom relief from immunotherapy becomes evident.  Food allergy  Continue strict avoidance of peanuts, shellfish, bananas, and strawberries as discussed and have access to epinephrine auto-injector 2 pack.  Food allergy action plan is in place.   Meds ordered this encounter  Medications  . fluticasone (FLOVENT  HFA) 110 MCG/ACT inhaler    Sig: 2 puffs every 12 hours to prevent coughing or wheezing.    Dispense:  1 Inhaler    Refill:  5  . benzonatate (TESSALON) 100 MG capsule    Sig: 1 capsule every 8 hours as needed for cough.    Dispense:  30 capsule      Refill:  1    Diagnostics: Spirometry:  Normal with an FEV1 of 103% predicted.  Please see scanned spirometry results for details.    Physical examination: Blood pressure 114/80, pulse 74, temperature 99.6 F (37.6 C), temperature source Oral, resp. rate 20, SpO2 95 %.  General: Alert, interactive, in no acute distress. HEENT: TMs pearly gray, turbinates moderately edematous without discharge, post-pharynx moderately erythematous. Neck: Supple without lymphadenopathy. Lungs: Clear to auscultation without wheezing, rhonchi or rales. CV: Normal S1, S2 without murmurs. Skin: Warm and dry, without lesions or rashes.  The following portions of the patient's history were reviewed and updated as appropriate: allergies, current medications, past family history, past medical history, past social history, past surgical history and problem list.  Allergies as of 09/30/2017      Reactions   Ivp Dye [iodinated Diagnostic Agents] Anaphylaxis   Cardiac Arrest   Other Hives   Sulfasalazine Hives   Tetrofosmin [technetium-34m] Hives, Anaphylaxis   Banana Hives   Strawberry Extract Hives   Sulfamethoxazole Rash   Biaxin [clarithromycin] Hives   Latex Hives   Peanuts [peanut Oil] Hives   Pravastatin Hives   Shellfish-derived Products Hives   Sulfa Antibiotics Hives      Medication List       Accurate as of 09/30/17  7:13 PM. Always use your most recent med list.          ACZONE 5 % topical gel Generic drug:  Dapsone Apply to face daily as needed.   albuterol 108 (90 Base) MCG/ACT inhaler Commonly known as:  PROVENTIL HFA;VENTOLIN HFA Inhale 2 puffs into the lungs every 4 (four) hours as needed for wheezing.   albuterol (2.5 MG/3ML) 0.083% nebulizer solution Commonly known as:  PROVENTIL 2.5 mg as needed.   aspirin EC 81 MG tablet Take 81 mg by mouth daily.   beclomethasone 80 MCG/ACT inhaler Commonly known as:  QVAR 2 puffs every morning to prevent cough or wheeze. May  increase to twice a day if asthma flare.   beclomethasone 80 MCG/ACT inhaler Commonly known as:  QVAR REDIHALER Inhale 2 puffs into the lungs daily. May increase to twice daily if asthma flare   benzonatate 100 MG capsule Commonly known as:  TESSALON 1 capsule every 8 hours as needed for cough.   cromolyn 5.2 MG/ACT nasal spray Commonly known as:  NASALCROM Place into the nose.   desonide 0.05 % ointment Commonly known as:  DESOWEN Apply to the scalp once daily as needed for itching.   diphenhydrAMINE 50 MG tablet Commonly known as:  BENADRYL Take 50 mg by mouth every 6 (six) hours as needed for itching.   donepezil 5 MG tablet Commonly known as:  ARICEPT Take by mouth.   doxycycline 100 MG capsule Commonly known as:  VIBRAMYCIN Take one tablet daily with food and a glass of water.   EPINEPHrine 0.3 mg/0.3 mL Soaj injection Commonly known as:  EPIPEN 2-PAK USE AS DIRECTED FOR SEVERE ALLERGIC REACTION   fluocinonide ointment 0.05 % Commonly known as:  LIDEX APPLY OINTMENT TO SCALP 2 TO 3 TIMES PER WEEK **DO NOT APPLY TO FACE**  fluticasone 110 MCG/ACT inhaler Commonly known as:  FLOVENT HFA Inhale 2 puffs into the lungs once. TWO PUFFS INTO LUNGS ONCE A DAY.   fluticasone 110 MCG/ACT inhaler Commonly known as:  FLOVENT HFA 2 puffs every 12 hours to prevent coughing or wheezing.   fluticasone 50 MCG/ACT nasal spray Commonly known as:  FLONASE TWO SPRAYS EACH NOSTRIL ONCE A DAY FOR NASAL CONGESTION OR DRAINAGE.   Fluticasone Furoate 100 MCG/ACT Aepb Commonly known as:  ARNUITY ELLIPTA Inhale 1 puff into the lungs daily.   guaiFENesin-codeine 100-10 MG/5ML syrup Take by mouth.   HYDROcodone-acetaminophen 5-325 MG tablet Commonly known as:  NORCO/VICODIN Take 1 tablet by mouth every 4 (four) hours as needed.   hydrOXYzine 25 MG tablet Commonly known as:  ATARAX/VISTARIL ONE TABLET AT BEDTIME AS NEEDED FOR ITCHING.   ibuprofen 800 MG tablet Commonly known  as:  ADVIL,MOTRIN Take 1 tablet (800 mg total) by mouth every 8 (eight) hours as needed for mild pain.   levocetirizine 5 MG tablet Commonly known as:  XYZAL Take 1 tablet (5 mg total) by mouth every evening.   meloxicam 7.5 MG tablet Commonly known as:  MOBIC Take by mouth.   methocarbamol 500 MG tablet Commonly known as:  ROBAXIN Take 1 tablet (500 mg total) by mouth 3 (three) times daily between meals as needed.   METROGEL 1 % gel Generic drug:  metroNIDAZOLE Use as needed for rosacea   NON FORMULARY   Olopatadine HCl 0.2 % Soln Apply 1 drop to eye 2 (two) times daily.   Olopatadine HCl 0.2 % Soln Commonly known as:  PATADAY One drop each eye once a day as needed for itchy eyes.   PEPCID PO Take by mouth.   predniSONE 20 MG tablet Commonly known as:  DELTASONE Take 40 mg by mouth as needed.   pregabalin 50 MG capsule Commonly known as:  LYRICA Take by mouth.   PROSCAR 5 MG tablet Generic drug:  finasteride Take one tablet by mouth daily   ZESTORETIC 10-12.5 MG tablet Generic drug:  lisinopril-hydrochlorothiazide Take by mouth.   lisinopril-hydrochlorothiazide 10-12.5 MG tablet Commonly known as:  PRINZIDE,ZESTORETIC Take by mouth.       Allergies  Allergen Reactions  . Ivp Dye [Iodinated Diagnostic Agents] Anaphylaxis    Cardiac Arrest  . Other Hives  . Sulfasalazine Hives  . Tetrofosmin [Technetium-57m] Hives and Anaphylaxis  . Banana Hives  . Strawberry Extract Hives  . Sulfamethoxazole Rash  . Biaxin [Clarithromycin] Hives  . Latex Hives  . Peanuts [Peanut Oil] Hives  . Pravastatin Hives  . Shellfish-Derived Products Hives  . Sulfa Antibiotics Hives   Review of systems: Review of systems negative except as noted in HPI / PMHx or noted below: Constitutional: Negative.  HENT: Negative.   Eyes: Negative.  Respiratory: Negative.   Cardiovascular: Negative.  Gastrointestinal: Negative.  Genitourinary: Negative.  Musculoskeletal:  Negative.  Neurological: Negative.  Endo/Heme/Allergies: Negative.  Cutaneous: Negative.   Past Medical History:  Diagnosis Date  . Fibromyalgia   . Joint pain   . Osteoarthritis     Family History  Problem Relation Age of Onset  . Allergic rhinitis Neg Hx   . Asthma Neg Hx   . Angioedema Neg Hx   . Eczema Neg Hx   . Immunodeficiency Neg Hx   . Urticaria Neg Hx     Social History   Social History  . Marital status: Single    Spouse name: N/A  . Number of children:  N/A  . Years of education: N/A   Occupational History  . Not on file.   Social History Main Topics  . Smoking status: Never Smoker  . Smokeless tobacco: Never Used  . Alcohol use No  . Drug use: No  . Sexual activity: Yes    Birth control/ protection: Surgical   Other Topics Concern  . Not on file   Social History Narrative  . No narrative on file    I appreciate the opportunity to take part in Porshia's care. Please do not hesitate to contact me with questions.  Sincerely,   R. Edgar Frisk, MD

## 2017-10-01 ENCOUNTER — Telehealth: Payer: Self-pay

## 2017-10-01 DIAGNOSIS — T7800XD Anaphylactic reaction due to unspecified food, subsequent encounter: Secondary | ICD-10-CM

## 2017-10-01 NOTE — Telephone Encounter (Signed)
-----   Message from Adelina Mings, MD sent at 10/01/2017  8:39 AM EDT ----- Lab orders were put in for egg white and egg component. Please put in an order for egg yolk as well. Thanks.

## 2017-10-01 NOTE — Telephone Encounter (Signed)
Orders have been placed. I will mail them out to patient today along with [facility] map. Patient not notified as of now.

## 2017-10-01 NOTE — Telephone Encounter (Signed)
Noted  

## 2017-10-01 NOTE — Telephone Encounter (Signed)
Lab order for egg yolk put in. She will come to office today to get labs drawn

## 2017-10-01 NOTE — Telephone Encounter (Signed)
-----   Message from Adelina Mings, MD sent at 09/30/2017  7:14 PM EDT ----- Please provide an order form for serum specific IgE against egg and egg components.  Please call the patient to let her know which labs she will be going to.  She is a Care[employer] patient.  Thank you.

## 2017-10-04 LAB — EGG COMPONENT PANEL
F232-IgE Ovalbumin: <0.1 kU/L
F233-IgE Ovomucoid: <0.1 kU/L

## 2017-10-04 LAB — ALLERGEN EGG YOLK F75: IgE Egg (Yolk): <0.1 kU/L

## 2017-10-04 LAB — F245-IGE EGG, WHOLE: Egg, Whole IgE: <0.1 kU/L

## 2017-10-05 NOTE — Telephone Encounter (Signed)
Labs have been drawn and resulted already.

## 2017-10-07 ENCOUNTER — Telehealth: Payer: Self-pay | Admitting: Allergy

## 2017-10-07 NOTE — Telephone Encounter (Signed)
Patient called and given results of labs. Patient wanted to know if she could get allergy injection this week and also if she could eat eggs?.Phone number [phone].

## 2017-10-07 NOTE — Telephone Encounter (Signed)
She has a very low chance of having a reaction to egg, though please offer an in-office oral challenge to scrambled egg as this is the safest way to re-introduce the food. She may resume allergy injections at this point.

## 2017-10-07 NOTE — Telephone Encounter (Signed)
Called patient.  Gave all information. She scheduled egg challenge for 10/21/17 with Dr. Verlin Fester at 9:00 am

## 2017-10-07 NOTE — Telephone Encounter (Signed)
done

## 2017-10-13 ENCOUNTER — Ambulatory Visit (INDEPENDENT_AMBULATORY_CARE_PROVIDER_SITE_OTHER): Payer: Medicare Other | Admitting: *Deleted

## 2017-10-13 DIAGNOSIS — J309 Allergic rhinitis, unspecified: Secondary | ICD-10-CM | POA: Diagnosis not present

## 2017-10-21 ENCOUNTER — Encounter: Payer: Self-pay | Admitting: Allergy and Immunology

## 2017-10-22 ENCOUNTER — Ambulatory Visit (INDEPENDENT_AMBULATORY_CARE_PROVIDER_SITE_OTHER): Payer: Medicare Other

## 2017-10-22 DIAGNOSIS — J309 Allergic rhinitis, unspecified: Secondary | ICD-10-CM

## 2017-10-29 ENCOUNTER — Ambulatory Visit (INDEPENDENT_AMBULATORY_CARE_PROVIDER_SITE_OTHER): Payer: Medicare Other

## 2017-10-29 DIAGNOSIS — J309 Allergic rhinitis, unspecified: Secondary | ICD-10-CM

## 2017-11-04 ENCOUNTER — Ambulatory Visit (INDEPENDENT_AMBULATORY_CARE_PROVIDER_SITE_OTHER): Payer: Medicare Other

## 2017-11-04 DIAGNOSIS — J309 Allergic rhinitis, unspecified: Secondary | ICD-10-CM | POA: Diagnosis not present

## 2017-11-10 ENCOUNTER — Ambulatory Visit (INDEPENDENT_AMBULATORY_CARE_PROVIDER_SITE_OTHER): Payer: Medicare Other | Admitting: *Deleted

## 2017-11-10 DIAGNOSIS — J309 Allergic rhinitis, unspecified: Secondary | ICD-10-CM | POA: Diagnosis not present

## 2017-11-25 ENCOUNTER — Ambulatory Visit (INDEPENDENT_AMBULATORY_CARE_PROVIDER_SITE_OTHER): Payer: Medicare Other

## 2017-11-25 ENCOUNTER — Other Ambulatory Visit: Payer: Self-pay | Admitting: Allergy and Immunology

## 2017-11-25 ENCOUNTER — Telehealth: Payer: Self-pay | Admitting: Allergy

## 2017-11-25 DIAGNOSIS — J309 Allergic rhinitis, unspecified: Secondary | ICD-10-CM

## 2017-11-25 DIAGNOSIS — R05 Cough: Secondary | ICD-10-CM

## 2017-11-25 DIAGNOSIS — R059 Cough, unspecified: Secondary | ICD-10-CM

## 2017-11-25 MED ORDER — BENZONATATE 100 MG PO CAPS: ORAL_CAPSULE | ORAL | 0 refills | Status: DC

## 2017-11-25 NOTE — Telephone Encounter (Signed)
Please advise 

## 2017-11-25 NOTE — Telephone Encounter (Signed)
[name] told me to disregard this notice.

## 2017-11-25 NOTE — Telephone Encounter (Signed)
Patient called and needed refill in benzonatate. Sent in one refill.

## 2017-12-11 ENCOUNTER — Ambulatory Visit (INDEPENDENT_AMBULATORY_CARE_PROVIDER_SITE_OTHER): Payer: Medicare Other

## 2017-12-11 DIAGNOSIS — J309 Allergic rhinitis, unspecified: Secondary | ICD-10-CM

## 2017-12-29 ENCOUNTER — Other Ambulatory Visit: Payer: Self-pay | Admitting: Allergy and Immunology

## 2017-12-29 DIAGNOSIS — J3089 Other allergic rhinitis: Secondary | ICD-10-CM

## 2018-01-01 ENCOUNTER — Ambulatory Visit (INDEPENDENT_AMBULATORY_CARE_PROVIDER_SITE_OTHER): Payer: Medicare Other

## 2018-01-01 ENCOUNTER — Encounter: Payer: Self-pay | Admitting: Allergy & Immunology

## 2018-01-01 DIAGNOSIS — J309 Allergic rhinitis, unspecified: Secondary | ICD-10-CM | POA: Diagnosis not present

## 2018-01-01 DIAGNOSIS — J301 Allergic rhinitis due to pollen: Secondary | ICD-10-CM

## 2018-01-05 DIAGNOSIS — J301 Allergic rhinitis due to pollen: Secondary | ICD-10-CM | POA: Diagnosis not present

## 2018-01-05 NOTE — Progress Notes (Signed)
VIALS EXP 01-05-19 

## 2018-01-06 DIAGNOSIS — J3089 Other allergic rhinitis: Secondary | ICD-10-CM | POA: Diagnosis not present

## 2018-01-18 ENCOUNTER — Ambulatory Visit (INDEPENDENT_AMBULATORY_CARE_PROVIDER_SITE_OTHER): Payer: Medicare Other

## 2018-01-18 DIAGNOSIS — J309 Allergic rhinitis, unspecified: Secondary | ICD-10-CM | POA: Diagnosis not present

## 2018-01-27 ENCOUNTER — Ambulatory Visit: Payer: Medicare Other | Admitting: Allergy and Immunology

## 2018-01-27 ENCOUNTER — Ambulatory Visit (INDEPENDENT_AMBULATORY_CARE_PROVIDER_SITE_OTHER): Payer: Medicare Other

## 2018-01-27 DIAGNOSIS — J309 Allergic rhinitis, unspecified: Secondary | ICD-10-CM

## 2018-02-05 ENCOUNTER — Encounter: Payer: Self-pay | Admitting: Allergy & Immunology

## 2018-02-05 ENCOUNTER — Ambulatory Visit (INDEPENDENT_AMBULATORY_CARE_PROVIDER_SITE_OTHER): Payer: Medicare Other | Admitting: Allergy & Immunology

## 2018-02-05 VITALS — BP 110/68 | HR 58 | Temp 98.3°F | Resp 20

## 2018-02-05 DIAGNOSIS — T7800XD Anaphylactic reaction due to unspecified food, subsequent encounter: Secondary | ICD-10-CM | POA: Diagnosis not present

## 2018-02-05 NOTE — Patient Instructions (Addendum)
1. 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 - I would continue to avoid egg in all forms for now. - I will let Dr. Verlin Fester know what happened today. - Call me over the weekend if you have any problems since I am on call. - My work cell is [phone].  2. Return in about 4 months (around 06/05/2018).  Please inform us of any Emergency Department visits, hospitalizations, or changes in symptoms. Call us before going to the ED for breathing or allergy symptoms since we might be able to fit you in for a sick visit. Feel free to contact us anytime with any questions, problems, or concerns.  It was a pleasure to meet you today! Happy Valentine's Day!   Websites that have reliable patient information: 1. American Academy of Asthma, Allergy, and Immunology: www.aaaai.org 2. Food Allergy Research and Education (FARE): foodallergy.org 3. Mothers of Asthmatics: http://www.asthmacommunitynetwork.org 4. American College of Allergy, Asthma, and Immunology: www.acaai.org

## 2018-02-05 NOTE — Progress Notes (Signed)
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 - I would continue to avoid egg in all forms for now. - I will let Dr. Verlin Fester know what happened today. - Call me over the weekend if you have any problems since I am on call. - My work cell is [phone].  2. Return in about 4 months (around 06/05/2018).  Subjective:   Gabrielle Shaw is a 57 y.o. female presenting today for follow up of  Chief Complaint  Patient presents with  . Food/Drug Challenge    to egg    [employer] has a history of the following: Patient Active Problem List   Diagnosis Date Noted  . Cough 09/30/2017  . Allergic reaction 09/30/2017  . Pruritus 07/22/2017  . Hypertension 07/22/2017  . Food allergy 01/22/2017  . Other allergic rhinitis 01/16/2016  . Mild persistent asthma 01/16/2016    History obtained from: chart review and patient.  [place] Primary Care Provider is Kristen Loader (Inactive).     [name] is a 57 y.o. female presenting for a food challege (egg). She was last seen in an office visit in October 2018 after having reacted to a flu shot. She developed vomiting and tachycardia with exposure to the influenza vaccine. Labs were completely negative to egg components. She also has a history of mild persistent asthma (controlled with ICS alone) as well as allergic rhinitis (on immunotherapy).    Since the last visit, she has done well. She did bring in a scrambled egg from [employer] for the challenge today.   Otherwise, there have been no changes to her past medical history, surgical history, family history, or social history.    Review of Systems: a 14-point review of systems is pertinent for what is mentioned in HPI.  Otherwise, all other systems were  negative. Constitutional: negative other than that listed in the HPI Eyes: negative other than that listed in the HPI Ears, nose, mouth, throat, and face: negative other than that listed in the HPI Respiratory: negative other than that listed in the HPI Cardiovascular: negative other than that listed in the HPI Gastrointestinal: negative other than that listed in the HPI Genitourinary: negative other than that listed in the HPI Integument: negative other than that listed in the HPI Hematologic: negative other than that listed in the HPI Musculoskeletal: negative other than that listed in the HPI Neurological: negative other than that listed in the HPI Allergy/Immunologic: negative other than that listed in the HPI    Objective:   Blood pressure 110/68, pulse (!) 58, temperature 98.3 F (36.8 C), temperature source Oral, resp. rate 20, SpO2 95 %. There is no height or weight on file to calculate BMI.   Physical Exam: deferred since this was an oral food challenge appointment only   Open graded scrambled egg oral challenge: The patient was able to tolerate the challenge today without adverse signs or symptoms. Vital signs were stable throughout the challenge and observation period. She received multiple doses separated by 15 minutes, each of which was separated by vitals and a brief physical exam. She received the following doses: lip rub and 1 gm. Unfortunately, after receiving the 1gm dose she developed some pruritis on her neck. She then developed some abdominal pain. Given this turn  of events, we decided to stop the challenge and give cetirizine 20mg . She then started vomiting, and we made the decision to administer epinephrine. She also received 40mg  prednisone and she was monitored for nearly 90 minutes after this. Her vitals remained fairly stable, but she did have some hypertensive episodes occasionally. By the end of the challenge, her vitals were back to normal. See flowsheet for  all of the vitals.     Salvatore Marvel, MD [place] [facility] [place]

## 2018-02-08 ENCOUNTER — Encounter: Payer: Self-pay | Admitting: Allergy & Immunology

## 2018-02-08 ENCOUNTER — Ambulatory Visit (INDEPENDENT_AMBULATORY_CARE_PROVIDER_SITE_OTHER): Payer: Medicare Other

## 2018-02-08 DIAGNOSIS — J309 Allergic rhinitis, unspecified: Secondary | ICD-10-CM | POA: Diagnosis not present

## 2018-02-17 ENCOUNTER — Other Ambulatory Visit: Payer: Self-pay

## 2018-02-17 MED ORDER — HYDROCOD POLST-CPM POLST ER 10-8 MG/5ML PO SUER: ORAL | 0 refills | Status: DC

## 2018-02-17 NOTE — Telephone Encounter (Signed)
Pt. Came in to receive an injection today. Pt. Requesting a cough medicine with codeine in it to help her cough bc I offered her benzonatate (tessalon) for her cough and she stated it doesn't help. Dr. Verlin Fester approved tussionex 140 mL for her cough with no refills. Pt. Stated she started coughing 2 days after she failed the egg challenge in our office the 15th of February.  Pt. Stated her cough should get better once she takes this cough medicine with codeine. I did make an appointment for the pt. with anne on Monday bc if pt. Is still coughing on Monday pt. Will be unable to receive her allergy injection and will need to be seen.

## 2018-02-22 ENCOUNTER — Ambulatory Visit (INDEPENDENT_AMBULATORY_CARE_PROVIDER_SITE_OTHER): Payer: Medicare Other | Admitting: Family Medicine

## 2018-02-22 ENCOUNTER — Encounter: Payer: Self-pay | Admitting: Family Medicine

## 2018-02-22 ENCOUNTER — Ambulatory Visit: Payer: Self-pay

## 2018-02-22 VITALS — BP 118/82 | HR 68 | Temp 98.0°F

## 2018-02-22 DIAGNOSIS — J4530 Mild persistent asthma, uncomplicated: Secondary | ICD-10-CM | POA: Diagnosis not present

## 2018-02-22 DIAGNOSIS — J302 Other seasonal allergic rhinitis: Secondary | ICD-10-CM

## 2018-02-22 DIAGNOSIS — J309 Allergic rhinitis, unspecified: Secondary | ICD-10-CM

## 2018-02-22 DIAGNOSIS — T7800XD Anaphylactic reaction due to unspecified food, subsequent encounter: Secondary | ICD-10-CM

## 2018-02-22 DIAGNOSIS — J3089 Other allergic rhinitis: Secondary | ICD-10-CM

## 2018-02-22 MED ORDER — ALBUTEROL SULFATE (2.5 MG/3ML) 0.083% IN NEBU: 2.5000 mg | INHALATION_SOLUTION | RESPIRATORY_TRACT | 1 refills | Status: DC | PRN

## 2018-02-22 MED ORDER — ALBUTEROL SULFATE HFA 108 (90 BASE) MCG/ACT IN AERS: 2.0000 | INHALATION_SPRAY | RESPIRATORY_TRACT | 1 refills | Status: DC | PRN

## 2018-02-22 NOTE — Progress Notes (Addendum)
[place] [address] Dept: [phone]  FOLLOW UP NOTE  Patient ID: Gabrielle Shaw, female    DOB: 30-Aug-1961  Age: 57 y.o. MRN: [id number] Date of Office Visit: 02/22/2018  Assessment  Chief Complaint: Cough (doing better)  HPI Gabrielle Shaw is a 57 year old female who presents for a sick visit today. She was last seen in this office on 02/05/2018 by Dr. Ernst Bowler for an egg food challenge in the office which was unsuccessful. She has a history of mild persistent asthma controlled by Flovent 110-2 puffs twice a day and allergic rhinitis for which she is receiving allergen immunotherapy.  At today's visit, she reports she is feeling well and her cough has resolved. She reports allergic rhinitis is under good control with Flonase nasal spray as needed and Xyzal 5 mg once a day as needed.  Asthma is reported as well controlled. She denies shortness of breath, wheeze, nighttime awakenings due to asthma symptoms. She currently uses Flovent 110- 2 puffs twice a day with a spacer and rarely needs to use her albuterol rescue inhaler or nebulizer.  She has not had any accidental ingestions of egg, peanut, strawberry, or banana, nor has she needed to use her EpiPen since her last visit to this office.  Her current medications are listed in her chart.  Drug Allergies:  Allergies  Allergen Reactions  . Ivp Dye [Iodinated Diagnostic Agents] Anaphylaxis    Cardiac Arrest  . Other Hives  . Sulfasalazine Hives  . Tetrofosmin [Technetium-85m] Hives and Anaphylaxis  . Banana Hives  . Strawberry Extract Hives  . Sulfamethoxazole Rash  . Biaxin [Clarithromycin] Hives  . Latex Hives  . Peanuts [Peanut Oil] Hives  . Pravastatin Hives  . Shellfish-Derived Products Hives  . Sulfa Antibiotics Hives    Physical Exam: BP 118/82   Pulse 68   Temp 98 F (36.7 C) (Oral)   SpO2 97%    Physical Exam  Constitutional: She is oriented to person, place, and time. She appears  well-developed and well-nourished.  HENT:  Head: Normocephalic and atraumatic.  Right Ear: External ear normal.  Left Ear: External ear normal.  Bilateral nares slightly erythematous and edematous with no drainage noted. Pharynx normal. Ears normal. Eyes normal.  Eyes: Conjunctivae are normal.  Neck: Normal range of motion. Neck supple.  Cardiovascular: Normal rate, regular rhythm and normal heart sounds.  S1S2 normal. Regular rate and rhythm. No murmur noted.  Pulmonary/Chest: Effort normal and breath sounds normal.  Lungs clear to auscultation  Musculoskeletal: Normal range of motion.  Neurological: She is alert and oriented to person, place, and time.  Skin: Skin is warm and dry.  Psychiatric: She has a normal mood and affect. Her behavior is normal. Judgment and thought content normal.    Diagnostics: FVC 2.37, FEV1  2.16. Predicted FVC 2.38, predicted FEV1 1.88. Spirometry is within the normal range.   Assessment and Plan: 1. Mild persistent asthma without complication   2. Seasonal and perennial allergic rhinitis   3. Anaphylaxis due to food, subsequent encounter     Meds ordered this encounter  Medications  . albuterol (PROVENTIL HFA) 108 (90 Base) MCG/ACT inhaler    Sig: Inhale 2 puffs into the lungs every 4 (four) hours as needed for wheezing or shortness of breath.    Dispense:  1 Inhaler    Refill:  1  . albuterol (PROVENTIL) (2.5 MG/3ML) 0.083% nebulizer solution    Sig: Take 3 mLs (2.5 mg total)  by nebulization as needed.    Dispense:  75 mL    Refill:  1    Patient Instructions  Flovent 110- 2 puffs twice a day with a spacer to prevent cough, wheeze, and shortness of breath. Continue Proventil inhaler 2 puffs every 4 hours as needed for cough, wheeze, or shortness of breath or instead albuterol via nebulizer 1 vial every 4 hours as needed for cough, wheeze, or shrtness of breath Continue Flonase nasal spray 2 sprays in each nostril as needed for a stuffy  nose Continue Xyzal 5 mg once a day as needed for a runny nose You may get your allergy shot today Continue to avoid shellfish, strawberries, egg, banana, and peanut. If you have an allergic reaction, give Benadryl 50 mg every 6 hours, and if you have life-threatening symptoms, inject with EpiPen 0.3 mg  Continue your medications as listed in your chart  Call me if this treatment plan is not working for you  Follow up in 2 months or sooner as needed    Return in about 2 months (around 04/24/2018), or if symptoms worsen or fail to improve.   Gabrielle Shaw was seen in the clinic with Dr. Shaune Leeks today.   Thank you for the opportunity to care for this patient.  Please do not hesitate to contact me with questions.  Gareth Morgan, FNP [facility]  I have provided oversight concerning Gareth Morgan' evaluation and treatment of this patient's health issues addressed during today's encounter. I agree with the assessment and therapeutic plan as outlined in the note.    Thank you for the opportunity to care for this patient.  Please do not hesitate to contact me with questions.  Penne Lash, M.D.  [facility] [address] [place], [place] [address] [phone]

## 2018-02-22 NOTE — Patient Instructions (Addendum)
Flovent 110- 2 puffs twice a day with a spacer to prevent cough, wheeze, and shortness of breath. Continue Proventil inhaler 2 puffs every 4 hours as needed for cough, wheeze, or shortness of breath or instead albuterol via nebulizer 1 vial every 4 hours as needed for cough, wheeze, or shrtness of breath Continue Flonase nasal spray 2 sprays in each nostril as needed for a stuffy nose Continue Xyzal 5 mg once a day as needed for a runny nose You may get your allergy shot today Continue to avoid shellfish, strawberries, egg, banana, and peanut. If you have an allergic reaction, give Benadryl 50 mg every 6 hours, and if you have life-threatening symptoms, inject with EpiPen 0.3 mg  Continue your medications as listed in your chart  Call me if this treatment plan is not working for you  Follow up in 2 months or sooner as needed

## 2018-03-02 ENCOUNTER — Ambulatory Visit (INDEPENDENT_AMBULATORY_CARE_PROVIDER_SITE_OTHER): Payer: Medicare Other

## 2018-03-02 DIAGNOSIS — J309 Allergic rhinitis, unspecified: Secondary | ICD-10-CM | POA: Diagnosis not present

## 2018-03-03 ENCOUNTER — Ambulatory Visit: Payer: Medicare Other | Admitting: Allergy & Immunology

## 2018-03-10 ENCOUNTER — Ambulatory Visit (INDEPENDENT_AMBULATORY_CARE_PROVIDER_SITE_OTHER): Payer: Medicare Other | Admitting: *Deleted

## 2018-03-10 DIAGNOSIS — J309 Allergic rhinitis, unspecified: Secondary | ICD-10-CM

## 2018-03-18 ENCOUNTER — Ambulatory Visit (INDEPENDENT_AMBULATORY_CARE_PROVIDER_SITE_OTHER): Payer: Medicare Other

## 2018-03-18 DIAGNOSIS — J309 Allergic rhinitis, unspecified: Secondary | ICD-10-CM | POA: Diagnosis not present

## 2018-03-23 HISTORY — PX: CYST EXCISION: SHX5701

## 2018-03-24 ENCOUNTER — Other Ambulatory Visit: Payer: Self-pay | Admitting: Allergy and Immunology

## 2018-03-26 ENCOUNTER — Ambulatory Visit (INDEPENDENT_AMBULATORY_CARE_PROVIDER_SITE_OTHER): Payer: Medicare Other

## 2018-03-26 DIAGNOSIS — J309 Allergic rhinitis, unspecified: Secondary | ICD-10-CM | POA: Diagnosis not present

## 2018-03-29 ENCOUNTER — Other Ambulatory Visit: Payer: Self-pay

## 2018-03-29 ENCOUNTER — Telehealth: Payer: Self-pay

## 2018-03-29 MED ORDER — FLUTICASONE PROPIONATE HFA 110 MCG/ACT IN AERO: 2.0000 | INHALATION_SPRAY | Freq: Once | RESPIRATORY_TRACT | 5 refills | Status: DC

## 2018-03-29 NOTE — Telephone Encounter (Signed)
We had changed that to Flovent 110 due to insurance coverage. Can you fill the Flovent and let this patient know please? Thank you

## 2018-03-29 NOTE — Telephone Encounter (Signed)
Informed pharmacy and sent in rx for flovent

## 2018-03-29 NOTE — Telephone Encounter (Signed)
Received a request o refill pts qvar 83mcg is it ok to refill as I did not see for her to continue it on the take one. Please advise

## 2018-04-15 ENCOUNTER — Ambulatory Visit (INDEPENDENT_AMBULATORY_CARE_PROVIDER_SITE_OTHER): Payer: Medicare Other

## 2018-04-15 DIAGNOSIS — J309 Allergic rhinitis, unspecified: Secondary | ICD-10-CM

## 2018-04-27 NOTE — Progress Notes (Signed)
Vials exp 04-29-19

## 2018-04-28 ENCOUNTER — Ambulatory Visit (INDEPENDENT_AMBULATORY_CARE_PROVIDER_SITE_OTHER): Payer: Medicare Other

## 2018-04-28 DIAGNOSIS — J309 Allergic rhinitis, unspecified: Secondary | ICD-10-CM | POA: Diagnosis not present

## 2018-04-29 DIAGNOSIS — J301 Allergic rhinitis due to pollen: Secondary | ICD-10-CM | POA: Diagnosis not present

## 2018-04-30 DIAGNOSIS — J3089 Other allergic rhinitis: Secondary | ICD-10-CM | POA: Diagnosis not present

## 2018-05-18 ENCOUNTER — Ambulatory Visit (INDEPENDENT_AMBULATORY_CARE_PROVIDER_SITE_OTHER): Payer: Medicare Other

## 2018-05-18 DIAGNOSIS — J309 Allergic rhinitis, unspecified: Secondary | ICD-10-CM

## 2018-05-21 ENCOUNTER — Ambulatory Visit (INDEPENDENT_AMBULATORY_CARE_PROVIDER_SITE_OTHER): Payer: Medicare Other | Admitting: Allergy & Immunology

## 2018-05-21 ENCOUNTER — Encounter: Payer: Self-pay | Admitting: Allergy & Immunology

## 2018-05-21 VITALS — BP 136/60 | HR 67 | Temp 98.1°F | Resp 16

## 2018-05-21 DIAGNOSIS — R05 Cough: Secondary | ICD-10-CM | POA: Diagnosis not present

## 2018-05-21 DIAGNOSIS — T7800XD Anaphylactic reaction due to unspecified food, subsequent encounter: Secondary | ICD-10-CM | POA: Diagnosis not present

## 2018-05-21 DIAGNOSIS — R059 Cough, unspecified: Secondary | ICD-10-CM

## 2018-05-21 DIAGNOSIS — K219 Gastro-esophageal reflux disease without esophagitis: Secondary | ICD-10-CM

## 2018-05-21 DIAGNOSIS — J3089 Other allergic rhinitis: Secondary | ICD-10-CM

## 2018-05-21 DIAGNOSIS — J302 Other seasonal allergic rhinitis: Secondary | ICD-10-CM

## 2018-05-21 DIAGNOSIS — J4540 Moderate persistent asthma, uncomplicated: Secondary | ICD-10-CM

## 2018-05-21 MED ORDER — BUDESONIDE-FORMOTEROL FUMARATE 160-4.5 MCG/ACT IN AERO: INHALATION_SPRAY | RESPIRATORY_TRACT | 5 refills | Status: DC

## 2018-05-21 MED ORDER — ALBUTEROL SULFATE HFA 108 (90 BASE) MCG/ACT IN AERS: 2.0000 | INHALATION_SPRAY | RESPIRATORY_TRACT | 1 refills | Status: DC | PRN

## 2018-05-21 MED ORDER — FLUTICASONE PROPIONATE 50 MCG/ACT NA SUSP: NASAL | 5 refills | Status: DC

## 2018-05-21 MED ORDER — HYDROCOD POLST-CPM POLST ER 10-8 MG/5ML PO SUER: ORAL | 0 refills | Status: DC

## 2018-05-21 MED ORDER — FLUTICASONE PROPIONATE HFA 110 MCG/ACT IN AERO: INHALATION_SPRAY | RESPIRATORY_TRACT | 5 refills | Status: DC

## 2018-05-21 MED ORDER — EPINEPHRINE 0.3 MG/0.3ML IJ SOAJ: INTRAMUSCULAR | 1 refills | Status: DC

## 2018-05-21 MED ORDER — LEVOCETIRIZINE DIHYDROCHLORIDE 5 MG PO TABS: ORAL_TABLET | ORAL | 5 refills | Status: DC

## 2018-05-21 NOTE — Patient Instructions (Addendum)
1. Moderate persistent asthma, uncomplicated - Lung function looked great today, but since you are coughing at night three times weekly, I feel that you need a different controller medication. - We will change you to Symbicort (this contains a long acting albuterol and a long acting steroid). - This will replace your Flovent. - Spacer sample and demonstration provided. - Daily controller medication(s): Symbicort 160/4.80mcg two puffs twice daily with spacer - Prior to physical activity: ProAir 2 puffs 10-15 minutes before physical activity. - Rescue medications: ProAir 4 puffs every 4-6 hours as needed - Changes during respiratory infections or worsening symptoms: Add on Flovent 148mcg to 2 puffs twice daily in addition to the Symbicort for ONE TO TWO WEEKS. - Asthma control goals:  * Full participation in all desired activities (may need albuterol before activity) * Albuterol use two time or less a week on average (not counting use with activity) * Cough interfering with sleep two time or less a month * Oral steroids no more than once a year * No hospitalizations  2. Cough - I am hopeful that the increase to the Symbicort will help with the cough. - We will send in Tussionex to use as needed as well.  3. GERD  - Continue with famotidine 20mg  daily in the morning. - Add on omeprazole 20mg  at night.   4. Seasonal and perennial allergic rhinitis - Continue with fluticasone one spray per nostril twice daily as needed.  - Continue with Xyzal 5mg  daily as needed.   5. 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  - EpiPen is up to date.   6. Return in about 4 months (around 09/20/2018).   Please inform us of any Emergency Department visits, hospitalizations, or changes in symptoms. Call us before going to the ED for breathing or allergy symptoms since we might be able to fit you in for a sick visit. Feel free to contact us anytime with any questions, problems, or  concerns.  It was a pleasure to see you again today!  Websites that have reliable patient information: 1. American Academy of Asthma, Allergy, and Immunology: www.aaaai.org 2. Food Allergy Research and Education (FARE): foodallergy.org 3. Mothers of Asthmatics: http://www.asthmacommunitynetwork.org 4. American College of Allergy, Asthma, and Immunology: MonthlyElectricBill.co.uk   Make sure you are registered to vote!

## 2018-05-21 NOTE — Progress Notes (Signed)
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 Moderate persistent asthma, uncomplicated - Lung function looked great today, but since you are coughing at night three times weekly, I feel that you need a different controller medication. - We will change you to Symbicort (this contains a long acting albuterol and a long acting steroid). - This will replace your Flovent. - Spacer sample and demonstration provided. - Daily controller medication(s): Symbicort 160/4.48mcg two puffs twice daily with spacer - Prior to physical activity: ProAir 2 puffs 10-15 minutes before physical activity. - Rescue medications: ProAir 4 puffs every 4-6 hours as needed - Changes during respiratory infections or worsening symptoms: Add on Flovent 163mcg to 2 puffs twice daily in addition to the Symbicort for ONE TO TWO WEEKS. - Asthma control goals:  * Full participation in all desired activities (may need albuterol before activity) * Albuterol use two time or less a week on average (not counting use with activity) * Cough interfering with sleep two time or less a month * Oral steroids no more than once a year * No hospitalizations  2. Cough - I am hopeful that the increase to the Symbicort will help with the cough. - We will send in Tussionex to use as needed as well.  3. GERD  - Continue with famotidine 20mg  daily in the morning. - Add on omeprazole 20mg  at night.   4. Seasonal and perennial allergic  rhinitis - Continue with fluticasone one spray per nostril twice daily as needed.  - Continue with Xyzal 5mg  daily as needed.   5. 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  - EpiPen is up to date.   6. Return in about 4 months (around 09/20/2018).  Subjective:   Gabrielle Shaw is a 57 y.o. female presenting today for follow up of  Chief Complaint  Patient presents with  . Allergic Rhinitis   . Asthma    Gabrielle Shaw has a history of the following: Patient Active Problem List   Diagnosis Date Noted  . Seasonal and perennial allergic rhinitis 02/22/2018  . Cough 09/30/2017  . Allergic reaction 09/30/2017  . Pruritus 07/22/2017  . Hypertension 07/22/2017  . Anaphylaxis due to food, subsequent encounter 01/22/2017  . Other allergic rhinitis 01/16/2016  . Mild persistent asthma without complication 30/86/5784    History obtained from: chart review and patient.  [place] Primary Care Provider is Kristen Loader (Inactive).     Gabrielle Shaw is a 57 y.o. female presenting for a follow up visit.  She was last seen in March 2019 by our nurse practitioner.  At that visit, her cough was much better.  She was on Flonase and Xyzal 5 mg daily.  Asthma was controlled with Flovent 110 mcg 2 puffs twice daily.   Since the last visit, she has done very well. She remains on the Flovent two puffs twice daily. She using her ProAir as needed. She has good days and bad days. None of the year is good for her, per the patient. She  does report that she is sleeping fairly well, although it has been worse over the last few days. She does report waking up three nights per week coughing. She will use her ProAir with improvement in her coughing. She has been on Advair in the distant past (2004), but she was taken off when she followed up with a physician at [facility].   She does have a history of reflux and has been on famotidine for a number of years.  However, she has been out of this  medication for a couple of weeks.  She was on a proton pump inhibitor at one point which did provide better coverage of her symptoms.  She is requesting Tussionex today to help with her cough.  She reports that she goes through about 1 bottle over the course of 1 year.  Gabrielle Shaw is on allergen immunotherapy. She receives two injections. Immunotherapy script #1 contains weeds and dust mites. She currently receives 0.36mL of the RED vial (1/100). Immunotherapy script #2 contains trees, grasses and cat. She currently receives 0.20mL of the RED vial (1/100). She started shots November of 2014 and reached maintenance in September of 2016. She did start having some large local reactions so her advance has been interrupted.  She feels that the shots are working well.  She is on Xyzal as well as fluticasone.  Otherwise, there have been no changes to her past medical history, surgical history, family history, or social history.    Review of Systems: a 14-point review of systems is pertinent for what is mentioned in HPI.  Otherwise, all other systems were negative. Constitutional: negative other than that listed in the HPI Eyes: negative other than that listed in the HPI Ears, nose, mouth, throat, and face: negative other than that listed in the HPI Respiratory: negative other than that listed in the HPI Cardiovascular: negative other than that listed in the HPI Gastrointestinal: negative other than that listed in the HPI Genitourinary: negative other than that listed in the HPI Integument: negative other than that listed in the HPI Hematologic: negative other than that listed in the HPI Musculoskeletal: negative other than that listed in the HPI Neurological: negative other than that listed in the HPI Allergy/Immunologic: negative other than that listed in the HPI    Objective:   Blood pressure 136/60, pulse 67, temperature 98.1 F (36.7 C), temperature source Oral, resp. rate 16, SpO2 95 %. There is  no height or weight on file to calculate BMI.   Physical Exam:  General: Alert, interactive, in no acute distress. Pleasant female.  Eyes: No conjunctival injection bilaterally, no discharge on the right, no discharge on the left and no Horner-Trantas dots present. PERRL bilaterally. EOMI without pain. No photophobia.  Ears: Right TM pearly gray with normal light reflex, Left TM pearly gray with normal light reflex, Right TM intact without perforation and Left TM intact without perforation.  Nose/Throat: External nose within normal limits and septum midline. Turbinates edematous and pale with clear discharge. Posterior oropharynx erythematous with cobblestoning in the posterior oropharynx. Tonsils 2+ without exudates.  Tongue without thrush. Lungs: Clear to auscultation without wheezing, rhonchi or rales. No increased work of breathing. CV: Normal S1/S2. No murmurs. Capillary refill <2 seconds.  Skin: Warm and dry, without lesions or rashes. Neuro:   Grossly intact. No focal deficits appreciated. Responsive to questions.  Diagnostic studies:   Spirometry: results normal (FEV1: 2.11/112%, FVC: 2.41/101%, FEV1/FVC: 88%).    Spirometry consistent with normal pattern.  Allergy Studies: none     Salvatore Marvel, MD  [facility]

## 2018-05-26 ENCOUNTER — Ambulatory Visit (INDEPENDENT_AMBULATORY_CARE_PROVIDER_SITE_OTHER): Payer: Medicare Other

## 2018-05-26 DIAGNOSIS — J309 Allergic rhinitis, unspecified: Secondary | ICD-10-CM

## 2018-06-01 ENCOUNTER — Ambulatory Visit (INDEPENDENT_AMBULATORY_CARE_PROVIDER_SITE_OTHER): Payer: Medicare Other

## 2018-06-01 DIAGNOSIS — J309 Allergic rhinitis, unspecified: Secondary | ICD-10-CM | POA: Diagnosis not present

## 2018-06-07 ENCOUNTER — Ambulatory Visit (INDEPENDENT_AMBULATORY_CARE_PROVIDER_SITE_OTHER): Payer: Medicare Other

## 2018-06-07 DIAGNOSIS — J309 Allergic rhinitis, unspecified: Secondary | ICD-10-CM | POA: Diagnosis not present

## 2018-06-14 ENCOUNTER — Ambulatory Visit (INDEPENDENT_AMBULATORY_CARE_PROVIDER_SITE_OTHER): Payer: Medicare Other

## 2018-06-14 DIAGNOSIS — J309 Allergic rhinitis, unspecified: Secondary | ICD-10-CM

## 2018-06-18 ENCOUNTER — Ambulatory Visit: Payer: Medicare Other | Admitting: Allergy & Immunology

## 2018-06-22 ENCOUNTER — Ambulatory Visit (INDEPENDENT_AMBULATORY_CARE_PROVIDER_SITE_OTHER): Payer: Medicare Other

## 2018-06-22 DIAGNOSIS — J309 Allergic rhinitis, unspecified: Secondary | ICD-10-CM

## 2018-06-30 ENCOUNTER — Ambulatory Visit (INDEPENDENT_AMBULATORY_CARE_PROVIDER_SITE_OTHER): Payer: Medicare Other

## 2018-06-30 DIAGNOSIS — J309 Allergic rhinitis, unspecified: Secondary | ICD-10-CM

## 2018-07-14 ENCOUNTER — Ambulatory Visit (INDEPENDENT_AMBULATORY_CARE_PROVIDER_SITE_OTHER): Payer: Medicare Other

## 2018-07-14 DIAGNOSIS — J309 Allergic rhinitis, unspecified: Secondary | ICD-10-CM

## 2018-07-28 ENCOUNTER — Ambulatory Visit (INDEPENDENT_AMBULATORY_CARE_PROVIDER_SITE_OTHER): Payer: Medicare Other

## 2018-07-28 DIAGNOSIS — J309 Allergic rhinitis, unspecified: Secondary | ICD-10-CM | POA: Diagnosis not present

## 2018-08-12 ENCOUNTER — Ambulatory Visit (INDEPENDENT_AMBULATORY_CARE_PROVIDER_SITE_OTHER): Payer: Medicare Other

## 2018-08-12 DIAGNOSIS — J309 Allergic rhinitis, unspecified: Secondary | ICD-10-CM | POA: Diagnosis not present

## 2018-08-26 ENCOUNTER — Ambulatory Visit (INDEPENDENT_AMBULATORY_CARE_PROVIDER_SITE_OTHER): Payer: Medicare Other

## 2018-08-26 DIAGNOSIS — J309 Allergic rhinitis, unspecified: Secondary | ICD-10-CM | POA: Diagnosis not present

## 2018-09-08 ENCOUNTER — Ambulatory Visit (INDEPENDENT_AMBULATORY_CARE_PROVIDER_SITE_OTHER): Payer: Medicare Other

## 2018-09-08 DIAGNOSIS — J309 Allergic rhinitis, unspecified: Secondary | ICD-10-CM | POA: Diagnosis not present

## 2018-09-13 NOTE — Progress Notes (Signed)
VIALS EXP 09-14-19 

## 2018-09-14 DIAGNOSIS — J301 Allergic rhinitis due to pollen: Secondary | ICD-10-CM

## 2018-09-15 DIAGNOSIS — J3089 Other allergic rhinitis: Secondary | ICD-10-CM

## 2018-09-24 ENCOUNTER — Ambulatory Visit: Payer: Medicare Other | Admitting: Pediatrics

## 2018-09-28 ENCOUNTER — Ambulatory Visit (INDEPENDENT_AMBULATORY_CARE_PROVIDER_SITE_OTHER): Payer: Medicare Other

## 2018-09-28 DIAGNOSIS — J309 Allergic rhinitis, unspecified: Secondary | ICD-10-CM

## 2018-10-07 ENCOUNTER — Other Ambulatory Visit: Payer: Self-pay | Admitting: Allergy and Immunology

## 2018-10-07 DIAGNOSIS — R059 Cough, unspecified: Secondary | ICD-10-CM

## 2018-10-07 DIAGNOSIS — R05 Cough: Secondary | ICD-10-CM

## 2018-10-11 ENCOUNTER — Ambulatory Visit (INDEPENDENT_AMBULATORY_CARE_PROVIDER_SITE_OTHER): Payer: Medicare Other

## 2018-10-11 DIAGNOSIS — J309 Allergic rhinitis, unspecified: Secondary | ICD-10-CM | POA: Diagnosis not present

## 2018-10-21 ENCOUNTER — Ambulatory Visit (INDEPENDENT_AMBULATORY_CARE_PROVIDER_SITE_OTHER): Payer: Medicare Other

## 2018-10-21 DIAGNOSIS — J309 Allergic rhinitis, unspecified: Secondary | ICD-10-CM | POA: Diagnosis not present

## 2018-10-26 ENCOUNTER — Ambulatory Visit (INDEPENDENT_AMBULATORY_CARE_PROVIDER_SITE_OTHER): Payer: Medicare Other

## 2018-10-26 DIAGNOSIS — J309 Allergic rhinitis, unspecified: Secondary | ICD-10-CM | POA: Diagnosis not present

## 2018-11-05 ENCOUNTER — Ambulatory Visit (INDEPENDENT_AMBULATORY_CARE_PROVIDER_SITE_OTHER): Payer: Medicare Other

## 2018-11-05 DIAGNOSIS — J309 Allergic rhinitis, unspecified: Secondary | ICD-10-CM | POA: Diagnosis not present

## 2018-11-09 ENCOUNTER — Ambulatory Visit (INDEPENDENT_AMBULATORY_CARE_PROVIDER_SITE_OTHER): Payer: Medicare Other

## 2018-11-09 DIAGNOSIS — J309 Allergic rhinitis, unspecified: Secondary | ICD-10-CM

## 2018-11-15 ENCOUNTER — Ambulatory Visit (INDEPENDENT_AMBULATORY_CARE_PROVIDER_SITE_OTHER): Payer: Medicare Other

## 2018-11-15 DIAGNOSIS — J309 Allergic rhinitis, unspecified: Secondary | ICD-10-CM | POA: Diagnosis not present

## 2018-11-25 ENCOUNTER — Ambulatory Visit (INDEPENDENT_AMBULATORY_CARE_PROVIDER_SITE_OTHER): Payer: Medicare Other | Admitting: Allergy and Immunology

## 2018-11-25 ENCOUNTER — Encounter: Payer: Self-pay | Admitting: Allergy and Immunology

## 2018-11-25 VITALS — BP 158/86 | HR 60 | Temp 98.4°F | Resp 16

## 2018-11-25 DIAGNOSIS — J3089 Other allergic rhinitis: Secondary | ICD-10-CM

## 2018-11-25 DIAGNOSIS — T7800XD Anaphylactic reaction due to unspecified food, subsequent encounter: Secondary | ICD-10-CM

## 2018-11-25 DIAGNOSIS — R05 Cough: Secondary | ICD-10-CM | POA: Diagnosis not present

## 2018-11-25 DIAGNOSIS — J4540 Moderate persistent asthma, uncomplicated: Secondary | ICD-10-CM | POA: Diagnosis not present

## 2018-11-25 DIAGNOSIS — R059 Cough, unspecified: Secondary | ICD-10-CM

## 2018-11-25 DIAGNOSIS — T7801XD Anaphylactic reaction due to peanuts, subsequent encounter: Secondary | ICD-10-CM

## 2018-11-25 DIAGNOSIS — K219 Gastro-esophageal reflux disease without esophagitis: Secondary | ICD-10-CM | POA: Diagnosis not present

## 2018-11-25 DIAGNOSIS — I10 Essential (primary) hypertension: Secondary | ICD-10-CM

## 2018-11-25 MED ORDER — FAMOTIDINE 20 MG PO TABS: 20.0000 mg | ORAL_TABLET | Freq: Every day | ORAL | 5 refills | Status: DC

## 2018-11-25 MED ORDER — BENZONATATE 100 MG PO CAPS: 100.0000 mg | ORAL_CAPSULE | Freq: Three times a day (TID) | ORAL | 0 refills | Status: DC | PRN

## 2018-11-25 MED ORDER — BUDESONIDE-FORMOTEROL FUMARATE 160-4.5 MCG/ACT IN AERO: INHALATION_SPRAY | RESPIRATORY_TRACT | 5 refills | Status: DC

## 2018-11-25 NOTE — Assessment & Plan Note (Signed)
Stable.  Continue appropriate allergen avoidance measures, aeroallergen immunotherapy injections, nasal saline as needed, and fluticasone nasal spray as needed.  For thick post nasal drainage, add guaifenesin [phone] mg (Mucinex)  twice daily as needed with adequate hydration as discussed.  Medications will be decreased or discontinued as symptom relief from immunotherapy becomes evident.

## 2018-11-25 NOTE — Patient Instructions (Addendum)
Moderate persistent asthma  A refill prescription has been provided for Symbicort (budesonide/formoterol) 160/4.5 g,  2 inhalations via spacer device twice a day.  During respiratory tract infections or asthma flares, add Flovent 110g 2 inhalations 2 times per day until symptoms have returned to baseline.  Continue albuterol HFA, 1 to 2 inhalations every 4-6 hours as needed.  Subjective and objective measures of pulmonary function will be followed and the treatment plan will be adjusted accordingly.  Other allergic rhinitis Stable.  Continue appropriate allergen avoidance measures, aeroallergen immunotherapy injections, nasal saline as needed, and fluticasone nasal spray as needed.  For thick post nasal drainage, add guaifenesin [phone] mg (Mucinex)  twice daily as needed with adequate hydration as discussed.  Medications will be decreased or discontinued as symptom relief from immunotherapy becomes evident.  GERD (gastroesophageal reflux disease) Well-controlled.  Continue appropriate reflux lifestyle modifications and famotidine daily.  Cough Currently controlled.  When it occurs it is most likely multifactorial.   A prescription has been provided for benzonatate 100 mg, 1-2 tablets every 8 hours if needed.  Treatment plan as outlined above.    Food allergy  Continue strict avoidance of peanuts, shellfish, bananas, and strawberries as discussed and have access to epinephrine auto-injector 2 pack.  Food allergy action plan is in place.  Hypertension Blood pressure reading was elevated today.  The patient stated that she did not take her blood pressure medication this morning.  The patient has been made aware of the elevated blood pressure reading and is encouraged to follow up with her primary care physician in the near future regarding this issue.    Continue antihypertensives as prescribed.   Return in about 6 months (around 05/27/2019), or if symptoms worsen or fail  to improve.

## 2018-11-25 NOTE — Assessment & Plan Note (Signed)
Blood pressure reading was elevated today.  The patient stated that she did not take her blood pressure medication this morning.  The patient has been made aware of the elevated blood pressure reading and is encouraged to follow up with her primary care physician in the near future regarding this issue.    Continue antihypertensives as prescribed.

## 2018-11-25 NOTE — Assessment & Plan Note (Addendum)
   A refill prescription has been provided for Symbicort (budesonide/formoterol) 160/4.5 g, 2 inhalations via spacer device twice a day.  During respiratory tract infections or asthma flares, add Flovent 110g 2 inhalations 2 times per day until symptoms have returned to baseline.  Continue albuterol HFA, 1 to 2 inhalations every 4-6 hours as needed.  Subjective and objective measures of pulmonary function will be followed and the treatment plan will be adjusted accordingly.

## 2018-11-25 NOTE — Assessment & Plan Note (Signed)
Well-controlled.  Continue appropriate reflux lifestyle modifications and famotidine daily.

## 2018-11-25 NOTE — Progress Notes (Signed)
Follow-up Note  RE: PAULINE PEGUES MRN: [id number] DOB: Jul 23, 1961 Date of Office Visit: 11/25/2018  Primary care provider: Kristen Loader (Inactive) Referring provider: No ref. provider found  History of present illness: Gabrielle Shaw is a 57 y.o. female with persistent asthma, allergic rhinitis, food allergy, and gastroesophageal reflux presenting today for follow-up.  She was last seen in this clinic on May 21, 2018.  She reports that her asthma is currently well controlled.  She is taking Symbicort 160-4.5 g, 2 inhalations via spacer device once or twice daily.  While on this regimen, she rarely requires albuterol rescue.  Her cough is currently well controlled.  She requests benzonatate or codeine cough syrup to have on hand in case the cough recurs.  She has been experiencing some nasal congestion recently, however admits that she only uses fluticasone nasal spray sporadically.  Her GERD is well controlled with famotidine.   Assessment and plan: Moderate persistent asthma  A refill prescription has been provided for Symbicort (budesonide/formoterol) 160/4.5 g,  2 inhalations via spacer device twice a day.  During respiratory tract infections or asthma flares, add Flovent 110g 2 inhalations 2 times per day until symptoms have returned to baseline.  Continue albuterol HFA, 1 to 2 inhalations every 4-6 hours as needed.  Subjective and objective measures of pulmonary function will be followed and the treatment plan will be adjusted accordingly.  Other allergic rhinitis Stable.  Continue appropriate allergen avoidance measures, aeroallergen immunotherapy injections, nasal saline as needed, and fluticasone nasal spray as needed.  For thick post nasal drainage, add guaifenesin [phone] mg (Mucinex)  twice daily as needed with adequate hydration as discussed.  Medications will be decreased or discontinued as symptom relief from immunotherapy becomes evident.  GERD  (gastroesophageal reflux disease) Well-controlled.  Continue appropriate reflux lifestyle modifications and famotidine daily.  Cough Currently controlled.  When it occurs it is most likely multifactorial.   A prescription has been provided for benzonatate 100 mg, 1-2 tablets every 8 hours if needed.  Treatment plan as outlined above.    Food allergy  Continue strict avoidance of peanuts, shellfish, bananas, and strawberries as discussed and have access to epinephrine auto-injector 2 pack.  Food allergy action plan is in place.  Hypertension Blood pressure reading was elevated today.  The patient stated that she did not take her blood pressure medication this morning.  The patient has been made aware of the elevated blood pressure reading and is encouraged to follow up with her primary care physician in the near future regarding this issue.    Continue antihypertensives as prescribed.   Meds ordered this encounter  Medications  . famotidine (PEPCID) 20 MG tablet    Sig: Take 1 tablet (20 mg total) by mouth daily.    Dispense:  30 tablet    Refill:  5  . budesonide-formoterol (SYMBICORT) 160-4.5 MCG/ACT inhaler    Sig: 2 puffs twice daily to prevent coughing or wheezing. Rinse gargle and spit.    Dispense:  1 Inhaler    Refill:  5  . benzonatate (TESSALON PERLES) 100 MG capsule    Sig: Take 1 capsule (100 mg total) by mouth every 8 (eight) hours as needed for cough.    Dispense:  20 capsule    Refill:  0    Diagnostics: Spirometry:  Normal with an FEV1 of 110% predicted.  Please see scanned spirometry results for details.    Physical examination: Blood pressure (!) 158/86, pulse 60, temperature 98.4  F (36.9 C), temperature source Oral, resp. rate 16, SpO2 98 %.  General: Alert, interactive, in no acute distress. HEENT: TMs pearly gray, turbinates moderately edematous without discharge, post-pharynx mildly erythematous. Neck: Supple without  lymphadenopathy. Lungs: Clear to auscultation without wheezing, rhonchi or rales. CV: Normal S1, S2 without murmurs. Skin: Warm and dry, without lesions or rashes.  The following portions of the patient's history were reviewed and updated as appropriate: allergies, current medications, past family history, past medical history, past social history, past surgical history and problem list.  Allergies as of 11/25/2018      Reactions   Iodinated Diagnostic Agents Anaphylaxis   Cardiac Arrest Seizure   Other Hives   Shellfish Allergy Hives   Sulfasalazine Hives   Tetrofosmin [technetium-104m Hives, Anaphylaxis   Banana Hives   Eggs Or Egg-derived Products Nausea And Vomiting   Strawberry Extract Hives   Sulfamethoxazole Rash   Biaxin [clarithromycin] Hives   Latex Hives   Peanuts [peanut Oil] Hives   Pravastatin Hives   Shellfish-derived Products Hives   Sulfa Antibiotics Hives, Rash      Medication List        Accurate as of 11/25/18 12:23 PM. Always use your most recent med list.          ACZONE 5 % topical gel Generic drug:  Dapsone Apply to face daily as needed.   albuterol (2.5 MG/3ML) 0.083% nebulizer solution Commonly known as:  PROVENTIL Take 3 mLs (2.5 mg total) by nebulization as needed.   albuterol 108 (90 Base) MCG/ACT inhaler Commonly known as:  PROVENTIL HFA;VENTOLIN HFA Inhale 2 puffs into the lungs every 4 (four) hours as needed for wheezing or shortness of breath.   aspirin EC 81 MG tablet Take 81 mg by mouth as needed.   benzonatate 100 MG capsule Commonly known as:  TESSALON Take 1 capsule (100 mg total) by mouth every 8 (eight) hours as needed for cough.   budesonide-formoterol 160-4.5 MCG/ACT inhaler Commonly known as:  SYMBICORT 2 puffs twice daily to prevent coughing or wheezing. Rinse gargle and spit.   chlorpheniramine-HYDROcodone 10-8 MG/5ML Suer Commonly known as:  TUSSIONEX 1 teaspoonful twice daily for coughing.   cromolyn 5.2  MG/ACT nasal spray Commonly known as:  NASALCROM Place into the nose.   desonide 0.05 % ointment Commonly known as:  DESOWEN Apply to the scalp once daily as needed for itching.   diphenhydrAMINE 50 MG tablet Commonly known as:  BENADRYL Take 50 mg by mouth every 6 (six) hours as needed for itching.   donepezil 5 MG tablet Commonly known as:  ARICEPT Take by mouth.   doxycycline 100 MG capsule Commonly known as:  VIBRAMYCIN Take one tablet daily with food and a glass of water.   EPINEPHrine 0.3 mg/0.3 mL Soaj injection Commonly known as:  EPI-PEN USE AS DIRECTED FOR SEVERE ALLERGIC REACTION   famotidine 20 MG tablet Commonly known as:  PEPCID Take 1 tablet (20 mg total) by mouth daily.   fluocinonide ointment 0.05 % Commonly known as:  LIDEX APPLY OINTMENT TO SCALP 2 TO 3 TIMES PER WEEK **DO NOT APPLY TO FACE**   fluticasone 110 MCG/ACT inhaler Commonly known as:  FLOVENT HFA 2 puffs every 12 hours to prevent coughing or wheezing.   fluticasone 50 MCG/ACT nasal spray Commonly known as:  FLONASE 1 spray per nostril 2 times daily as needed for stuffy nose.   hydrOXYzine 25 MG tablet Commonly known as:  ATARAX/VISTARIL ONE TABLET AT BEDTIME AS  NEEDED FOR ITCHING.   ibuprofen 800 MG tablet Commonly known as:  ADVIL,MOTRIN Take 1 tablet (800 mg total) by mouth every 8 (eight) hours as needed for mild pain.   levocetirizine 5 MG tablet Commonly known as:  XYZAL Take 1 tablet (5 mg total) by mouth every evening.   LYRICA 100 MG capsule Generic drug:  pregabalin Take by mouth.   meloxicam 7.5 MG tablet Commonly known as:  MOBIC Take by mouth.   METROGEL 1 % gel Generic drug:  metroNIDAZOLE Use as needed for rosacea   NON FORMULARY   Olopatadine HCl 0.2 % Soln INSTILL ONE DROP INTO EACH EYE ONCE DAILY AS NEEDED FOR ITCHY EYES   ONETOUCH VERIO w/Device Kit by Does not apply route.   potassium chloride 10 MEQ tablet Commonly known as:   K-DUR,KLOR-CON Take by mouth.   PROSCAR 5 MG tablet Generic drug:  finasteride Take one tablet by mouth daily   ZESTORETIC 10-12.5 MG tablet Generic drug:  lisinopril-hydrochlorothiazide Take by mouth.   lisinopril-hydrochlorothiazide 10-12.5 MG tablet Commonly known as:  PRINZIDE,ZESTORETIC Take 0.5 tablets by mouth.       Allergies  Allergen Reactions  . Iodinated Diagnostic Agents Anaphylaxis    Cardiac Arrest Seizure  . Other Hives  . Shellfish Allergy Hives  . Sulfasalazine Hives  . Tetrofosmin [Technetium-67m Hives and Anaphylaxis  . Banana Hives  . Eggs Or Egg-Derived Products Nausea And Vomiting  . Strawberry Extract Hives  . Sulfamethoxazole Rash  . Biaxin [Clarithromycin] Hives  . Latex Hives  . Peanuts [Peanut Oil] Hives  . Pravastatin Hives  . Shellfish-Derived Products Hives  . Sulfa Antibiotics Hives and Rash   Review of systems: Review of systems negative except as noted in HPI / PMHx or noted below: Constitutional: Negative.  HENT: Negative.   Eyes: Negative.  Respiratory: Negative.   Cardiovascular: Negative.  Gastrointestinal: Negative.  Genitourinary: Negative.  Musculoskeletal: Negative.  Neurological: Negative.  Endo/Heme/Allergies: Negative.  Cutaneous: Negative.   Past Medical History:  Diagnosis Date  . Asthma   . Fibromyalgia   . Joint pain   . Osteoarthritis     Family History  Problem Relation Age of Onset  . Allergic rhinitis Neg Hx   . Asthma Neg Hx   . Angioedema Neg Hx   . Eczema Neg Hx   . Immunodeficiency Neg Hx   . Urticaria Neg Hx     Social History   Socioeconomic History  . Marital status: Single    Spouse name: Not on file  . Number of children: Not on file  . Years of education: Not on file  . Highest education level: Not on file  Occupational History  . Not on file  Social Needs  . Financial resource strain: Not on file  . Food insecurity:    Worry: Not on file    Inability: Not on file  .  Transportation needs:    Medical: Not on file    Non-medical: Not on file  Tobacco Use  . Smoking status: Never Smoker  . Smokeless tobacco: Never Used  Substance and Sexual Activity  . Alcohol use: No  . Drug use: No  . Sexual activity: Yes    Birth control/protection: Surgical  Lifestyle  . Physical activity:    Days per week: Not on file    Minutes per session: Not on file  . Stress: Not on file  Relationships  . Social connections:    Talks on phone: Not on file  Gets together: Not on file    Attends religious service: Not on file    Active member of club or organization: Not on file    Attends meetings of clubs or organizations: Not on file    Relationship status: Not on file  . Intimate partner violence:    Fear of current or ex partner: Not on file    Emotionally abused: Not on file    Physically abused: Not on file    Forced sexual activity: Not on file  Other Topics Concern  . Not on file  Social History Narrative  . Not on file    I appreciate the opportunity to take part in Modine's care. Please do not hesitate to contact me with questions.  Sincerely,   R. Edgar Frisk, MD

## 2018-11-25 NOTE — Assessment & Plan Note (Signed)
Currently controlled.  When it occurs it is most likely multifactorial.   A prescription has been provided for benzonatate 100 mg, 1-2 tablets every 8 hours if needed.  Treatment plan as outlined above.

## 2018-11-25 NOTE — Assessment & Plan Note (Deleted)
Currently controlled.  When it occurs it is most likely multifactorial.   A prescription has been provided for benzonatate 100 mg, 1-2 tablets every 8 hours if needed.  Treatment plan as outlined above.

## 2018-11-25 NOTE — Assessment & Plan Note (Signed)
   Continue strict avoidance of peanuts, shellfish, bananas, and strawberries as discussed and have access to epinephrine auto-injector 2 pack.  Food allergy action plan is in place.

## 2018-12-09 ENCOUNTER — Ambulatory Visit (INDEPENDENT_AMBULATORY_CARE_PROVIDER_SITE_OTHER): Payer: Medicare Other

## 2018-12-09 DIAGNOSIS — J309 Allergic rhinitis, unspecified: Secondary | ICD-10-CM | POA: Diagnosis not present

## 2018-12-27 NOTE — Progress Notes (Signed)
Exp 12/29/19

## 2018-12-29 ENCOUNTER — Ambulatory Visit (INDEPENDENT_AMBULATORY_CARE_PROVIDER_SITE_OTHER): Payer: Medicare Other

## 2018-12-29 DIAGNOSIS — J309 Allergic rhinitis, unspecified: Secondary | ICD-10-CM

## 2018-12-30 DIAGNOSIS — J301 Allergic rhinitis due to pollen: Secondary | ICD-10-CM | POA: Diagnosis not present

## 2018-12-31 DIAGNOSIS — J3089 Other allergic rhinitis: Secondary | ICD-10-CM

## 2019-01-12 ENCOUNTER — Ambulatory Visit (INDEPENDENT_AMBULATORY_CARE_PROVIDER_SITE_OTHER): Payer: Medicare Other

## 2019-01-12 DIAGNOSIS — J309 Allergic rhinitis, unspecified: Secondary | ICD-10-CM | POA: Diagnosis not present

## 2019-01-24 ENCOUNTER — Ambulatory Visit (INDEPENDENT_AMBULATORY_CARE_PROVIDER_SITE_OTHER): Payer: Medicare Other

## 2019-01-24 DIAGNOSIS — J309 Allergic rhinitis, unspecified: Secondary | ICD-10-CM | POA: Diagnosis not present

## 2019-02-01 ENCOUNTER — Ambulatory Visit (INDEPENDENT_AMBULATORY_CARE_PROVIDER_SITE_OTHER): Payer: Medicare Other

## 2019-02-01 DIAGNOSIS — J309 Allergic rhinitis, unspecified: Secondary | ICD-10-CM | POA: Diagnosis not present

## 2019-02-07 ENCOUNTER — Ambulatory Visit (INDEPENDENT_AMBULATORY_CARE_PROVIDER_SITE_OTHER): Payer: Medicare Other

## 2019-02-07 DIAGNOSIS — J309 Allergic rhinitis, unspecified: Secondary | ICD-10-CM

## 2019-02-14 ENCOUNTER — Ambulatory Visit (INDEPENDENT_AMBULATORY_CARE_PROVIDER_SITE_OTHER): Payer: Medicare Other

## 2019-02-14 DIAGNOSIS — J309 Allergic rhinitis, unspecified: Secondary | ICD-10-CM

## 2019-02-21 ENCOUNTER — Ambulatory Visit (INDEPENDENT_AMBULATORY_CARE_PROVIDER_SITE_OTHER): Payer: Medicare Other

## 2019-02-21 DIAGNOSIS — J309 Allergic rhinitis, unspecified: Secondary | ICD-10-CM

## 2019-03-02 ENCOUNTER — Ambulatory Visit (INDEPENDENT_AMBULATORY_CARE_PROVIDER_SITE_OTHER): Payer: Medicare Other

## 2019-03-02 DIAGNOSIS — J309 Allergic rhinitis, unspecified: Secondary | ICD-10-CM

## 2019-03-15 ENCOUNTER — Ambulatory Visit (INDEPENDENT_AMBULATORY_CARE_PROVIDER_SITE_OTHER): Payer: Medicare Other | Admitting: *Deleted

## 2019-03-15 DIAGNOSIS — J309 Allergic rhinitis, unspecified: Secondary | ICD-10-CM | POA: Diagnosis not present

## 2019-03-28 ENCOUNTER — Ambulatory Visit (INDEPENDENT_AMBULATORY_CARE_PROVIDER_SITE_OTHER): Payer: Medicare Other | Admitting: *Deleted

## 2019-03-28 DIAGNOSIS — J309 Allergic rhinitis, unspecified: Secondary | ICD-10-CM | POA: Diagnosis not present

## 2019-04-11 ENCOUNTER — Ambulatory Visit (INDEPENDENT_AMBULATORY_CARE_PROVIDER_SITE_OTHER): Payer: Medicare Other | Admitting: *Deleted

## 2019-04-11 DIAGNOSIS — J309 Allergic rhinitis, unspecified: Secondary | ICD-10-CM | POA: Diagnosis not present

## 2019-04-12 DIAGNOSIS — J301 Allergic rhinitis due to pollen: Secondary | ICD-10-CM | POA: Diagnosis not present

## 2019-04-12 NOTE — Progress Notes (Signed)
Vials exp 04-11-2020

## 2019-05-03 ENCOUNTER — Ambulatory Visit (INDEPENDENT_AMBULATORY_CARE_PROVIDER_SITE_OTHER): Payer: Medicare Other

## 2019-05-03 ENCOUNTER — Other Ambulatory Visit: Payer: Self-pay

## 2019-05-03 DIAGNOSIS — J309 Allergic rhinitis, unspecified: Secondary | ICD-10-CM

## 2019-05-10 ENCOUNTER — Other Ambulatory Visit: Payer: Self-pay

## 2019-05-10 ENCOUNTER — Ambulatory Visit (INDEPENDENT_AMBULATORY_CARE_PROVIDER_SITE_OTHER): Payer: Medicare Other

## 2019-05-10 DIAGNOSIS — J309 Allergic rhinitis, unspecified: Secondary | ICD-10-CM | POA: Diagnosis not present

## 2019-05-17 ENCOUNTER — Other Ambulatory Visit: Payer: Self-pay

## 2019-05-17 ENCOUNTER — Ambulatory Visit (INDEPENDENT_AMBULATORY_CARE_PROVIDER_SITE_OTHER): Payer: Medicare Other

## 2019-05-17 DIAGNOSIS — J309 Allergic rhinitis, unspecified: Secondary | ICD-10-CM

## 2019-05-24 ENCOUNTER — Other Ambulatory Visit: Payer: Self-pay

## 2019-05-24 ENCOUNTER — Ambulatory Visit: Payer: Medicare Other | Admitting: Allergy and Immunology

## 2019-05-24 ENCOUNTER — Ambulatory Visit (INDEPENDENT_AMBULATORY_CARE_PROVIDER_SITE_OTHER): Payer: Medicare Other

## 2019-05-24 DIAGNOSIS — J309 Allergic rhinitis, unspecified: Secondary | ICD-10-CM | POA: Diagnosis not present

## 2019-05-26 ENCOUNTER — Ambulatory Visit: Payer: Medicare Other | Admitting: Allergy and Immunology

## 2019-05-31 ENCOUNTER — Other Ambulatory Visit: Payer: Self-pay

## 2019-05-31 ENCOUNTER — Encounter: Payer: Self-pay | Admitting: Allergy and Immunology

## 2019-05-31 ENCOUNTER — Ambulatory Visit (INDEPENDENT_AMBULATORY_CARE_PROVIDER_SITE_OTHER): Payer: Medicare Other | Admitting: Allergy and Immunology

## 2019-05-31 ENCOUNTER — Other Ambulatory Visit: Payer: Self-pay | Admitting: *Deleted

## 2019-05-31 VITALS — BP 130/78 | HR 76 | Temp 96.4°F | Resp 17 | Ht 60.24 in | Wt 189.0 lb

## 2019-05-31 DIAGNOSIS — J4540 Moderate persistent asthma, uncomplicated: Secondary | ICD-10-CM

## 2019-05-31 DIAGNOSIS — J3089 Other allergic rhinitis: Secondary | ICD-10-CM | POA: Diagnosis not present

## 2019-05-31 DIAGNOSIS — T7800XD Anaphylactic reaction due to unspecified food, subsequent encounter: Secondary | ICD-10-CM

## 2019-05-31 DIAGNOSIS — K219 Gastro-esophageal reflux disease without esophagitis: Secondary | ICD-10-CM

## 2019-05-31 MED ORDER — BUDESONIDE-FORMOTEROL FUMARATE 160-4.5 MCG/ACT IN AERO: INHALATION_SPRAY | RESPIRATORY_TRACT | 5 refills | Status: DC

## 2019-05-31 MED ORDER — EPINEPHRINE 0.3 MG/0.3ML IJ SOAJ: INTRAMUSCULAR | 2 refills | Status: DC

## 2019-05-31 MED ORDER — ALBUTEROL SULFATE HFA 108 (90 BASE) MCG/ACT IN AERS: 2.0000 | INHALATION_SPRAY | RESPIRATORY_TRACT | 1 refills | Status: DC | PRN

## 2019-05-31 MED ORDER — FLUTICASONE PROPIONATE 50 MCG/ACT NA SUSP: NASAL | 5 refills | Status: DC

## 2019-05-31 MED ORDER — FLUTICASONE PROPIONATE HFA 110 MCG/ACT IN AERO: 2.0000 | INHALATION_SPRAY | Freq: Two times a day (BID) | RESPIRATORY_TRACT | 2 refills | Status: DC

## 2019-05-31 MED ORDER — ALBUTEROL SULFATE (2.5 MG/3ML) 0.083% IN NEBU: 2.5000 mg | INHALATION_SOLUTION | RESPIRATORY_TRACT | 1 refills | Status: DC | PRN

## 2019-05-31 NOTE — Assessment & Plan Note (Signed)
Stable.  A refill prescription has been provided for Symbicort (budesonide/formoterol) 160/4.5 g, 2 inhalations via spacer device twice a day.  During respiratory tract infections or asthma flares, add Flovent 110g 2 inhalations 2 times per day until symptoms have returned to baseline.  Continue albuterol HFA, 1 to 2 inhalations every 4-6 hours as needed.  Subjective and objective measures of pulmonary function will be followed and the treatment plan will be adjusted accordingly.

## 2019-05-31 NOTE — Assessment & Plan Note (Signed)
   Continue careful avoidance of peanuts, shellfish, bananas, and strawberries as discussed and have access to epinephrine auto-injector 2 pack.  Food allergy action plan is in place.  A refill prescription has been provided for epinephrine 0.3 mg autoinjector (EpiPen) 2 pack along with instructions for its proper administration.

## 2019-05-31 NOTE — Patient Instructions (Addendum)
Moderate persistent asthma Stable.  A refill prescription has been provided for Symbicort (budesonide/formoterol) 160/4.5 g, 2 inhalations via spacer device twice a day.  During respiratory tract infections or asthma flares, add Flovent 110g 2 inhalations 2 times per day until symptoms have returned to baseline.  Continue albuterol HFA, 1 to 2 inhalations every 4-6 hours as needed.  Subjective and objective measures of pulmonary function will be followed and the treatment plan will be adjusted accordingly.  Seasonal and perennial allergic rhinitis Currently well controlled  Continue appropriate allergen avoidance measures, aeroallergen immunotherapy injections, nasal saline as needed, and fluticasone nasal spray as needed.  For thick post nasal drainage, add guaifenesin [phone] mg (Mucinex)  twice daily as needed with adequate hydration as discussed.  Medications will be decreased or discontinued as symptom relief from immunotherapy becomes evident.  GERD (gastroesophageal reflux disease)  Continue appropriate reflux lifestyle modifications and famotidine daily.  If needed, may take famotidine twice daily.  Food allergy  Continue careful avoidance of peanuts, shellfish, bananas, and strawberries as discussed and have access to epinephrine auto-injector 2 pack.  Food allergy action plan is in place.  A refill prescription has been provided for epinephrine 0.3 mg autoinjector (EpiPen) 2 pack along with instructions for its proper administration.   Return in about 4 months (around 09/30/2019), or if symptoms worsen or fail to improve.

## 2019-05-31 NOTE — Progress Notes (Signed)
Follow-up Note  RE: Gabrielle Shaw MRN: [id number] DOB: 1961-04-03 Date of Office Visit: 05/31/2019  Primary care provider: Kristen Loader (Inactive) Referring provider: No ref. provider found  History of present illness: Gabrielle Shaw is a 58 y.o. female with persistent asthma, allergic rhinitis, food allergy, and gastroesophageal reflux presenting today for follow-up.  She was last seen in this clinic in December 2019.  She reports that in the interval since her previous visit her asthma has been well controlled.  She typically requires albuterol rescue 1 time per month on average and does not experience limitations in normal daily activities or nocturnal awakenings due to lower respiratory symptoms.  She is currently taking Symbicort 160-4.5 g, 2 inhalations via spacer device twice daily.  Her nasal allergy symptoms have been well controlled with immunotherapy injections, fluticasone nasal spray, and the use of a humidifier.  She has had no problems or complications with the immunotherapy injections.  She reports that her acid reflux has been relatively well controlled with famotidine 20 mg once daily, however she does still experience heartburn on occasion.  She carefully avoids peanuts, shellfish, bananas, and strawberries.  She notes that her epinephrine autoinjector has expired.  Assessment and plan: Moderate persistent asthma Stable.  A refill prescription has been provided for Symbicort (budesonide/formoterol) 160/4.5 g, 2 inhalations via spacer device twice a day.  During respiratory tract infections or asthma flares, add Flovent 110g 2 inhalations 2 times per day until symptoms have returned to baseline.  Continue albuterol HFA, 1 to 2 inhalations every 4-6 hours as needed.  Subjective and objective measures of pulmonary function will be followed and the treatment plan will be adjusted accordingly.  Seasonal and perennial allergic rhinitis Currently well controlled   Continue appropriate allergen avoidance measures, aeroallergen immunotherapy injections, nasal saline as needed, and fluticasone nasal spray as needed.  For thick post nasal drainage, add guaifenesin [phone] mg (Mucinex)  twice daily as needed with adequate hydration as discussed.  Medications will be decreased or discontinued as symptom relief from immunotherapy becomes evident.  GERD (gastroesophageal reflux disease)  Continue appropriate reflux lifestyle modifications and famotidine daily.  If needed, may take famotidine twice daily.  Food allergy  Continue careful avoidance of peanuts, shellfish, bananas, and strawberries as discussed and have access to epinephrine auto-injector 2 pack.  Food allergy action plan is in place.  A refill prescription has been provided for epinephrine 0.3 mg autoinjector (EpiPen) 2 pack along with instructions for its proper administration.   Meds ordered this encounter  Medications  . budesonide-formoterol (SYMBICORT) 160-4.5 MCG/ACT inhaler    Sig: 2 puffs twice daily to prevent coughing or wheezing. Rinse gargle and spit.    Dispense:  1 Inhaler    Refill:  5  . EPINEPHrine (EPIPEN 2-PAK) 0.3 mg/0.3 mL IJ SOAJ injection    Sig: USE AS DIRECTED FOR SEVERE ALLERGIC REACTION    Dispense:  2 Device    Refill:  2  . fluticasone (FLONASE) 50 MCG/ACT nasal spray    Sig: 1 spray per nostril 2 times daily as needed for stuffy nose.    Dispense:  16 g    Refill:  5  . albuterol (PROVENTIL) (2.5 MG/3ML) 0.083% nebulizer solution    Sig: Take 3 mLs (2.5 mg total) by nebulization as needed.    Dispense:  75 mL    Refill:  1  . albuterol (PROAIR HFA) 108 (90 Base) MCG/ACT inhaler    Sig: Inhale 2 puffs into the  lungs every 4 (four) hours as needed for wheezing or shortness of breath.    Dispense:  1 Inhaler    Refill:  1  . fluticasone (FLOVENT HFA) 110 MCG/ACT inhaler    Sig: Inhale 2 puffs into the lungs 2 (two) times daily.    Dispense:  1 Inhaler     Refill:  2    Diagnostics: Spirometry:  Normal with an FEV1 of 110% predicted. This study was performed while the patient was asymptomatic.  Please see scanned spirometry results for details.    Physical examination: Blood pressure 130/78, pulse 76, temperature (!) 96.4 F (35.8 C), resp. rate 17, height 5' 0.24" (1.53 m), weight 189 lb (85.7 kg), SpO2 97 %.  General: Alert, interactive, in no acute distress. HEENT: TMs pearly gray, turbinates minimally edematous without discharge, post-pharynx unremarkable. Neck: Supple without lymphadenopathy. Lungs: Clear to auscultation without wheezing, rhonchi or rales. CV: Normal S1, S2 without murmurs. Skin: Warm and dry, without lesions or rashes.  The following portions of the patient's history were reviewed and updated as appropriate: allergies, current medications, past family history, past medical history, past social history, past surgical history and problem list.  Allergies as of 05/31/2019      Reactions   Iodinated Diagnostic Agents Anaphylaxis   Cardiac Arrest Seizure   Other Hives   Shellfish Allergy Hives   Sulfasalazine Hives   Tetrofosmin [technetium-85m Hives, Anaphylaxis   Banana Hives   Eggs Or Egg-derived Products Nausea And Vomiting   Strawberry Extract Hives   Sulfamethoxazole Rash   Biaxin [clarithromycin] Hives   Latex Hives   Peanuts [peanut Oil] Hives   Pravastatin Hives   Shellfish-derived Products Hives   Sulfa Antibiotics Hives, Rash      Medication List       Accurate as of May 31, 2019 10:25 AM. If you have any questions, ask your nurse or doctor.        STOP taking these medications   benzonatate 100 MG capsule Commonly known as:  TBest boyStopped by:  REdmonia Lynch MD   chlorpheniramine-HYDROcodone 10-8 MG/5ML Suer Commonly known as:  Tussionex Pennkinetic ER Stopped by:  REdmonia Lynch MD   cromolyn 5.2 MG/ACT nasal spray Commonly known as:  NASALCROM Stopped by:  REdmonia Lynch MD   doxycycline 100 MG capsule Commonly known as:  VIBRAMYCIN Stopped by:  REdmonia Lynch MD     TAKE these medications   Aczone 5 % topical gel Generic drug:  Dapsone Apply to face daily as needed.   albuterol (2.5 MG/3ML) 0.083% nebulizer solution Commonly known as:  PROVENTIL Take 3 mLs (2.5 mg total) by nebulization as needed.   albuterol 108 (90 Base) MCG/ACT inhaler Commonly known as:  ProAir HFA Inhale 2 puffs into the lungs every 4 (four) hours as needed for wheezing or shortness of breath.   aspirin EC 81 MG tablet Take 81 mg by mouth as needed.   budesonide-formoterol 160-4.5 MCG/ACT inhaler Commonly known as:  Symbicort 2 puffs twice daily to prevent coughing or wheezing. Rinse gargle and spit.   desonide 0.05 % ointment Commonly known as:  DESOWEN Apply to the scalp once daily as needed for itching.   diphenhydrAMINE 50 MG tablet Commonly known as:  BENADRYL Take 50 mg by mouth every 6 (six) hours as needed for itching.   donepezil 5 MG tablet Commonly known as:  ARICEPT Take by mouth.   EPINEPHrine 0.3 mg/0.3 mL Soaj injection Commonly known  as:  EpiPen 2-Pak USE AS DIRECTED FOR SEVERE ALLERGIC REACTION   famotidine 20 MG tablet Commonly known as:  Pepcid Take 1 tablet (20 mg total) by mouth daily.   fluocinonide ointment 0.05 % Commonly known as:  LIDEX APPLY OINTMENT TO SCALP 2 TO 3 TIMES PER WEEK **DO NOT APPLY TO FACE**   fluticasone 110 MCG/ACT inhaler Commonly known as:  Flovent HFA Inhale 2 puffs into the lungs 2 (two) times daily. What changed:    how much to take  how to take this  when to take this  additional instructions Changed by:  R Edgar Frisk, MD   fluticasone 50 MCG/ACT nasal spray Commonly known as:  FLONASE 1 spray per nostril 2 times daily as needed for stuffy nose.   hydrOXYzine 25 MG tablet Commonly known as:  ATARAX/VISTARIL ONE TABLET AT BEDTIME AS NEEDED FOR ITCHING.   ibuprofen 800  MG tablet Commonly known as:  ADVIL Take 1 tablet (800 mg total) by mouth every 8 (eight) hours as needed for mild pain.   levocetirizine 5 MG tablet Commonly known as:  XYZAL Take 1 tablet (5 mg total) by mouth every evening.   Lyrica 100 MG capsule Generic drug:  pregabalin Take by mouth.   meloxicam 7.5 MG tablet Commonly known as:  MOBIC Take by mouth.   methocarbamol 500 MG tablet Commonly known as:  ROBAXIN Take 500 mg by mouth 3 (three) times daily.   Metrogel 1 % gel Generic drug:  metroNIDAZOLE Use as needed for rosacea   NON FORMULARY   Olopatadine HCl 0.2 % Soln INSTILL ONE DROP INTO EACH EYE ONCE DAILY AS NEEDED FOR ITCHY EYES   OneTouch Verio w/Device Kit by Does not apply route.   potassium chloride 10 MEQ tablet Commonly known as:  K-DUR Take by mouth.   Proscar 5 MG tablet Generic drug:  finasteride Take one tablet by mouth daily   traMADol 50 MG tablet Commonly known as:  ULTRAM TAKE 1 TABLET BY MOUTH EVERY 6 HOURS AS NEEDED FOR PAIN FOR UP TO 5 DAYS   Zestoretic 10-12.5 MG tablet Generic drug:  lisinopril-hydrochlorothiazide Take by mouth.   lisinopril-hydrochlorothiazide 10-12.5 MG tablet Commonly known as:  ZESTORETIC Take 0.5 tablets by mouth.       Allergies  Allergen Reactions  . Iodinated Diagnostic Agents Anaphylaxis    Cardiac Arrest Seizure  . Other Hives  . Shellfish Allergy Hives  . Sulfasalazine Hives  . Tetrofosmin [Technetium-67m Hives and Anaphylaxis  . Banana Hives  . Eggs Or Egg-Derived Products Nausea And Vomiting  . Strawberry Extract Hives  . Sulfamethoxazole Rash  . Biaxin [Clarithromycin] Hives  . Latex Hives  . Peanuts [Peanut Oil] Hives  . Pravastatin Hives  . Shellfish-Derived Products Hives  . Sulfa Antibiotics Hives and Rash   Review of systems: Review of systems negative except as noted in HPI / PMHx or noted below: Constitutional: Negative.  HENT: Negative.   Eyes: Negative.  Respiratory:  Negative.   Cardiovascular: Negative.  Gastrointestinal: Negative.  Genitourinary: Negative.  Musculoskeletal: Negative.  Neurological: Negative.  Endo/Heme/Allergies: Negative.  Cutaneous: Negative.  Past Medical History:  Diagnosis Date  . Asthma   . Fibromyalgia   . Joint pain   . Osteoarthritis     Family History  Problem Relation Age of Onset  . Allergic rhinitis Neg Hx   . Asthma Neg Hx   . Angioedema Neg Hx   . Eczema Neg Hx   . Immunodeficiency Neg Hx   .  Urticaria Neg Hx     Social History   Socioeconomic History  . Marital status: Single    Spouse name: Not on file  . Number of children: Not on file  . Years of education: Not on file  . Highest education level: Not on file  Occupational History  . Not on file  Social Needs  . Financial resource strain: Not on file  . Food insecurity:    Worry: Not on file    Inability: Not on file  . Transportation needs:    Medical: Not on file    Non-medical: Not on file  Tobacco Use  . Smoking status: Never Smoker  . Smokeless tobacco: Never Used  Substance and Sexual Activity  . Alcohol use: No  . Drug use: No  . Sexual activity: Yes    Birth control/protection: Surgical  Lifestyle  . Physical activity:    Days per week: Not on file    Minutes per session: Not on file  . Stress: Not on file  Relationships  . Social connections:    Talks on phone: Not on file    Gets together: Not on file    Attends religious service: Not on file    Active member of club or organization: Not on file    Attends meetings of clubs or organizations: Not on file    Relationship status: Not on file  . Intimate partner violence:    Fear of current or ex partner: Not on file    Emotionally abused: Not on file    Physically abused: Not on file    Forced sexual activity: Not on file  Other Topics Concern  . Not on file  Social History Narrative  . Not on file    I appreciate the opportunity to take part in Gabrielle Shaw's care.  Please do not hesitate to contact me with questions.  Sincerely,   R. Edgar Frisk, MD

## 2019-05-31 NOTE — Assessment & Plan Note (Signed)
Currently well controlled  Continue appropriate allergen avoidance measures, aeroallergen immunotherapy injections, nasal saline as needed, and fluticasone nasal spray as needed.  For thick post nasal drainage, add guaifenesin [phone] mg (Mucinex)  twice daily as needed with adequate hydration as discussed.  Medications will be decreased or discontinued as symptom relief from immunotherapy becomes evident.

## 2019-05-31 NOTE — Assessment & Plan Note (Signed)
   Continue appropriate reflux lifestyle modifications and famotidine daily.  If needed, may take famotidine twice daily.

## 2019-06-21 ENCOUNTER — Ambulatory Visit (INDEPENDENT_AMBULATORY_CARE_PROVIDER_SITE_OTHER): Payer: Medicare Other

## 2019-06-21 ENCOUNTER — Other Ambulatory Visit: Payer: Self-pay

## 2019-06-21 DIAGNOSIS — J309 Allergic rhinitis, unspecified: Secondary | ICD-10-CM | POA: Diagnosis not present

## 2019-07-12 ENCOUNTER — Ambulatory Visit (INDEPENDENT_AMBULATORY_CARE_PROVIDER_SITE_OTHER): Payer: Medicare Other

## 2019-07-12 ENCOUNTER — Other Ambulatory Visit: Payer: Self-pay

## 2019-07-12 DIAGNOSIS — J309 Allergic rhinitis, unspecified: Secondary | ICD-10-CM | POA: Diagnosis not present

## 2019-08-02 ENCOUNTER — Other Ambulatory Visit: Payer: Self-pay

## 2019-08-02 ENCOUNTER — Ambulatory Visit (INDEPENDENT_AMBULATORY_CARE_PROVIDER_SITE_OTHER): Payer: Medicare Other

## 2019-08-02 DIAGNOSIS — J309 Allergic rhinitis, unspecified: Secondary | ICD-10-CM

## 2019-08-12 NOTE — Progress Notes (Signed)
Exp 08/15/20 

## 2019-08-16 ENCOUNTER — Ambulatory Visit (INDEPENDENT_AMBULATORY_CARE_PROVIDER_SITE_OTHER): Payer: Medicare Other

## 2019-08-16 ENCOUNTER — Other Ambulatory Visit: Payer: Self-pay

## 2019-08-16 DIAGNOSIS — J309 Allergic rhinitis, unspecified: Secondary | ICD-10-CM

## 2019-08-17 DIAGNOSIS — J301 Allergic rhinitis due to pollen: Secondary | ICD-10-CM | POA: Diagnosis not present

## 2019-08-18 DIAGNOSIS — J3089 Other allergic rhinitis: Secondary | ICD-10-CM | POA: Diagnosis not present

## 2019-08-30 ENCOUNTER — Other Ambulatory Visit: Payer: Self-pay

## 2019-08-30 ENCOUNTER — Ambulatory Visit (INDEPENDENT_AMBULATORY_CARE_PROVIDER_SITE_OTHER): Payer: Medicare Other

## 2019-08-30 DIAGNOSIS — J309 Allergic rhinitis, unspecified: Secondary | ICD-10-CM | POA: Diagnosis not present

## 2019-09-08 ENCOUNTER — Telehealth: Payer: Self-pay | Admitting: Allergy and Immunology

## 2019-09-08 NOTE — Telephone Encounter (Signed)
Patient called and said she had seen her PCP and they recommended that she call us to get the flu shot. She then said she was allergic to the flu shot and eggs. So, I asked her if she needed the flu shot without egg and she said no, she was allergic to the flu shot and that the PCP said their was another shot. I told her someone would call her back.

## 2019-09-08 NOTE — Telephone Encounter (Signed)
Called and spoke with the patient and she stated that she has an allergy to eggs and has failed the in office challenge in the past with Dr. Ernst Bowler. Patient has seen her PCP and they recommended that she receive the flu vaccine without egg given that she has had an allergic reaction to the flu vaccine with egg. The PCP suggested that she come to [place] for the Flu vaccine w/o egg since they do not have that particular flu vaccine in their office. Please advise.

## 2019-09-14 NOTE — Telephone Encounter (Signed)
Egg free Flu Vaccine ordered for patient by Marcie Bal for patient to obtain flu vaccine.  Patient will be contacted once vaccine is in office and provider has given instructions.

## 2019-09-15 ENCOUNTER — Ambulatory Visit (INDEPENDENT_AMBULATORY_CARE_PROVIDER_SITE_OTHER): Payer: Medicare Other

## 2019-09-15 ENCOUNTER — Other Ambulatory Visit: Payer: Self-pay

## 2019-09-15 DIAGNOSIS — J309 Allergic rhinitis, unspecified: Secondary | ICD-10-CM | POA: Diagnosis not present

## 2019-09-26 NOTE — Telephone Encounter (Signed)
If the patient is interested in receiving the vaccine she would need to receive it via challenge. Thanks.

## 2019-09-27 ENCOUNTER — Other Ambulatory Visit: Payer: Self-pay

## 2019-09-27 ENCOUNTER — Ambulatory Visit (INDEPENDENT_AMBULATORY_CARE_PROVIDER_SITE_OTHER): Payer: Medicare Other

## 2019-09-27 DIAGNOSIS — J309 Allergic rhinitis, unspecified: Secondary | ICD-10-CM | POA: Diagnosis not present

## 2019-09-27 NOTE — Telephone Encounter (Signed)
Can we please have an egg free flu vaccine set aside for [facility] and placed in a location where it can be traveled to the [place] office? Thank You.

## 2019-09-27 NOTE — Telephone Encounter (Signed)
Will be delivered to [facility] 12-30-18

## 2019-09-27 NOTE — Telephone Encounter (Signed)
FYI

## 2019-09-27 NOTE — Telephone Encounter (Signed)
Spoke with patient in regards to doing the challenge. She agreed, patient's appt was moved to 9:30 on 10/04/2019.

## 2019-09-27 NOTE — Telephone Encounter (Signed)
Noted! Thank you

## 2019-10-04 ENCOUNTER — Other Ambulatory Visit: Payer: Self-pay

## 2019-10-04 ENCOUNTER — Encounter: Payer: Self-pay | Admitting: Allergy and Immunology

## 2019-10-04 ENCOUNTER — Ambulatory Visit (INDEPENDENT_AMBULATORY_CARE_PROVIDER_SITE_OTHER): Payer: Medicare Other | Admitting: Allergy and Immunology

## 2019-10-04 VITALS — BP 130/90 | HR 65 | Temp 97.4°F | Resp 16 | Ht 61.0 in | Wt 177.4 lb

## 2019-10-04 DIAGNOSIS — J3089 Other allergic rhinitis: Secondary | ICD-10-CM

## 2019-10-04 DIAGNOSIS — J4540 Moderate persistent asthma, uncomplicated: Secondary | ICD-10-CM

## 2019-10-04 DIAGNOSIS — T7800XD Anaphylactic reaction due to unspecified food, subsequent encounter: Secondary | ICD-10-CM | POA: Diagnosis not present

## 2019-10-04 DIAGNOSIS — K219 Gastro-esophageal reflux disease without esophagitis: Secondary | ICD-10-CM

## 2019-10-04 DIAGNOSIS — T50Z95D Adverse effect of other vaccines and biological substances, subsequent encounter: Secondary | ICD-10-CM | POA: Diagnosis not present

## 2019-10-04 DIAGNOSIS — Z23 Encounter for immunization: Secondary | ICD-10-CM

## 2019-10-04 NOTE — Assessment & Plan Note (Signed)
   Continue appropriate reflux lifestyle modifications and famotidine daily.  If needed, may take famotidine twice daily. 

## 2019-10-04 NOTE — Patient Instructions (Addendum)
History of vaccine reaction  The patient was able to tolerate the split dose, egg free influenza vaccine challenge today without adverse signs or symptoms. Therefore, she has the same risk of systemic reaction associated with egg free influenza vaccine as the general population.  In the future, she may receive the egg free influenza vaccine in a single dose.  Moderate persistent asthma Stable.  For now, continue Symbicort (budesonide/formoterol) 160/4.5 g, 2 inhalations via spacer device twice a day.  During respiratory tract infections or asthma flares, add Flovent 110g 2 inhalations 2 times per day until symptoms have returned to baseline.  Continue albuterol HFA, 1 to 2 inhalations every 4-6 hours as needed.  Subjective and objective measures of pulmonary function will be followed and the treatment plan will be adjusted accordingly.  Seasonal and perennial allergic rhinitis  Continue appropriate allergen avoidance measures, aeroallergen immunotherapy injections, nasal saline as needed, and fluticasone nasal spray as needed.  For thick post nasal drainage, add guaifenesin [phone] mg (Mucinex)  twice daily as needed with adequate hydration as discussed.  Medications will be decreased or discontinued as symptom relief from immunotherapy becomes evident.  Food allergy  Continue careful avoidance of egg, peanuts, shellfish, bananas, and strawberries as discussed and have access to epinephrine auto-injector 2 pack.  Food allergy action plan is in place.  Continue to have access to epinephrine 0.3 mg autoinjector 2 pack.  GERD (gastroesophageal reflux disease)  Continue appropriate reflux lifestyle modifications and famotidine daily.  If needed, may take famotidine twice daily.   Return in about 4 months (around 02/04/2020), or if symptoms worsen or fail to improve.

## 2019-10-04 NOTE — Assessment & Plan Note (Signed)
Stable.  For now, continue Symbicort (budesonide/formoterol) 160/4.5 g, 2 inhalations via spacer device twice a day.  During respiratory tract infections or asthma flares, add Flovent 110g 2 inhalations 2 times per day until symptoms have returned to baseline.  Continue albuterol HFA, 1 to 2 inhalations every 4-6 hours as needed.  Subjective and objective measures of pulmonary function will be followed and the treatment plan will be adjusted accordingly.

## 2019-10-04 NOTE — Progress Notes (Addendum)
Follow-up Note  RE: Gabrielle Shaw MRN: [id number] DOB: 15-Aug-1961 Date of Office Visit: 10/04/2019  Primary care provider: [name], MD Referring provider: No ref. provider found  History of present illness: Gabrielle Shaw is a 58 y.o. female returning today for influenza challenge.  Approximately 2 years ago she received an influenza vaccine and within 10 to 15 minutes experienced nausea and vomiting.  She reports that she "just kept throwing up" to the point that she was given an injection of epinephrine with benefit.  She came in today to this office with Dr. Ernst Bowler for egg challenge and when the scrambled egg was rubbed near her lip prior to even consuming egg, she began vomiting so profusely that "it was coming out of (her) nose" and believes that she may have developed hives. She was dosed with epinephrine and observed until symptoms resolved.  She has avoided eggs since that time.  In addition, she continues to avoid peanuts, shellfish, bananas, and strawberries. Gabrielle Shaw reports that her asthma is currently well controlled.  She rarely requires albuterol rescue and does not experience limitations in normal daily activities or nocturnal awakenings due to lower respiratory symptoms. She has no nasal allergy symptom complaints today.  Assessment and plan: History of vaccine reaction  The patient was able to tolerate the split dose, egg free influenza vaccine challenge today without adverse signs or symptoms. Therefore, she has the same risk of systemic reaction associated with egg free influenza vaccine as the general population.  In the future, she may receive the egg free influenza vaccine in a single dose.  Moderate persistent asthma Stable.  For now, continue Symbicort (budesonide/formoterol) 160/4.5 g, 2 inhalations via spacer device twice a day.  During respiratory tract infections or asthma flares, add Flovent 110g 2 inhalations 2 times per day until symptoms have  returned to baseline.  Continue albuterol HFA, 1 to 2 inhalations every 4-6 hours as needed.  Subjective and objective measures of pulmonary function will be followed and the treatment plan will be adjusted accordingly.  Seasonal and perennial allergic rhinitis  Continue appropriate allergen avoidance measures, aeroallergen immunotherapy injections, nasal saline as needed, and fluticasone nasal spray as needed.  For thick post nasal drainage, add guaifenesin [phone] mg (Mucinex)  twice daily as needed with adequate hydration as discussed.  Medications will be decreased or discontinued as symptom relief from immunotherapy becomes evident.  Food allergy  Continue careful avoidance of egg, peanuts, shellfish, bananas, and strawberries as discussed and have access to epinephrine auto-injector 2 pack.  Food allergy action plan is in place.  Continue to have access to epinephrine 0.3 mg autoinjector 2 pack.  GERD (gastroesophageal reflux disease)  Continue appropriate reflux lifestyle modifications and famotidine daily.  If needed, may take famotidine twice daily.  Diagnostics: Spirometry:  Normal with an FEV1 of 114% predicted. This study was performed while the patient was asymptomatic.  Please see scanned spirometry results for details. Egg-free influenza vaccination challenge: The patient was able to tolerate the challenge today without adverse signs or symptoms. Vital signs were stable throughout the challenge and observation period.     Physical examination: Blood pressure 130/90, pulse 65, temperature (!) 97.4 F (36.3 C), temperature source Temporal, resp. rate 16, height _0  (1.549 m), weight 177 lb 6.4 oz (80.5 kg), SpO2 99 %.  General: Alert, interactive, in no acute distress. HEENT: TMs pearly gray, turbinates mildly edematous without discharge, post-pharynx unremarkable. Neck: Supple without lymphadenopathy. Lungs: Clear to auscultation without wheezing,  rhonchi or  rales. CV: Normal S1, S2 without murmurs. Skin: Warm and dry, without lesions or rashes.  The following portions of the patient's history were reviewed and updated as appropriate: allergies, current medications, past family history, past medical history, past social history, past surgical history and problem list.  Current Outpatient Medications  Medication Sig Dispense Refill  . albuterol (PROAIR HFA) 108 (90 Base) MCG/ACT inhaler Inhale 2 puffs into the lungs every 4 (four) hours as needed for wheezing or shortness of breath. 1 Inhaler 1  . albuterol (PROVENTIL) (2.5 MG/3ML) 0.083% nebulizer solution Take 3 mLs (2.5 mg total) by nebulization as needed. 75 mL 1  . aspirin EC 81 MG tablet Take 81 mg by mouth as needed.     . Blood Glucose Monitoring Suppl (ONETOUCH VERIO) w/Device KIT by Does not apply route.    . budesonide-formoterol (SYMBICORT) 160-4.5 MCG/ACT inhaler 2 puffs twice daily to prevent coughing or wheezing. Rinse gargle and spit. 1 Inhaler 5  . Dapsone (ACZONE) 5 % topical gel Apply to face daily as needed.    . desonide (DESOWEN) 0.05 % ointment Apply to the scalp once daily as needed for itching.    . diphenhydrAMINE (BENADRYL) 50 MG tablet Take 50 mg by mouth every 6 (six) hours as needed for itching.    . donepezil (ARICEPT) 5 MG tablet Take by mouth.    . EPINEPHrine (EPIPEN 2-PAK) 0.3 mg/0.3 mL IJ SOAJ injection USE AS DIRECTED FOR SEVERE ALLERGIC REACTION 2 Device 2  . famotidine (PEPCID) 20 MG tablet Take 1 tablet (20 mg total) by mouth daily. 30 tablet 5  . finasteride (PROSCAR) 5 MG tablet Take one tablet by mouth daily    . fluocinonide ointment (LIDEX) 0.05 % APPLY OINTMENT TO SCALP 2 TO 3 TIMES PER WEEK **DO NOT APPLY TO FACE**    . fluticasone (FLONASE) 50 MCG/ACT nasal spray 1 spray per nostril 2 times daily as needed for stuffy nose. 16 g 5  . fluticasone (FLOVENT HFA) 110 MCG/ACT inhaler Inhale 2 puffs into the lungs 2 (two) times daily. 1 Inhaler 2  .  hydrOXYzine (ATARAX/VISTARIL) 25 MG tablet ONE TABLET AT BEDTIME AS NEEDED FOR ITCHING. 30 tablet 3  . ibuprofen (ADVIL,MOTRIN) 800 MG tablet Take 1 tablet (800 mg total) by mouth every 8 (eight) hours as needed for mild pain. 30 tablet 0  . levocetirizine (XYZAL) 5 MG tablet Take 1 tablet (5 mg total) by mouth every evening. 30 tablet 5  . lisinopril-hydrochlorothiazide (PRINZIDE,ZESTORETIC) 10-12.5 MG tablet Take 0.5 tablets by mouth.     . meloxicam (MOBIC) 7.5 MG tablet Take by mouth.    . methocarbamol (ROBAXIN) 500 MG tablet Take 500 mg by mouth 3 (three) times daily.    . metroNIDAZOLE (METROGEL) 1 % gel Use as needed for rosacea    . NON FORMULARY     . Olopatadine HCl 0.2 % SOLN INSTILL ONE DROP INTO EACH EYE ONCE DAILY AS NEEDED FOR ITCHY EYES 2.5 mL 2  . potassium chloride (K-DUR,KLOR-CON) 10 MEQ tablet Take by mouth.    . pregabalin (LYRICA) 100 MG capsule Take by mouth.    . traMADol (ULTRAM) 50 MG tablet TAKE 1 TABLET BY MOUTH EVERY 6 HOURS AS NEEDED FOR PAIN FOR UP TO 5 DAYS    . lisinopril-hydrochlorothiazide (ZESTORETIC) 10-12.5 MG tablet Take by mouth.     No current facility-administered medications for this visit.     Allergies  Allergen Reactions  . Iodinated Diagnostic  Agents Anaphylaxis    Cardiac Arrest Seizure  . Other Hives  . Shellfish Allergy Hives  . Sulfasalazine Hives  . Tetrofosmin [Technetium-27m Hives and Anaphylaxis  . Banana Hives  . Eggs Or Egg-Derived Products Nausea And Vomiting  . Strawberry Extract Hives  . Sulfamethoxazole Rash  . Biaxin [Clarithromycin] Hives  . Latex Hives  . Peanuts [Peanut Oil] Hives  . Pravastatin Hives  . Shellfish-Derived Products Hives  . Sulfa Antibiotics Hives and Rash    Review of systems: Review of systems negative except as noted in HPI / PMHx or noted below: Constitutional: Negative.  HENT: Negative.   Eyes: Negative.  Respiratory: Negative.   Cardiovascular: Negative.  Gastrointestinal: Negative.   Genitourinary: Negative.  Musculoskeletal: Negative.  Neurological: Negative.  Endo/Heme/Allergies: Negative.  Cutaneous: Negative.   Past Medical History:  Diagnosis Date  . Asthma   . Fibromyalgia   . Joint pain   . Osteoarthritis     Family History  Problem Relation Age of Onset  . Allergic rhinitis Neg Hx   . Asthma Neg Hx   . Angioedema Neg Hx   . Eczema Neg Hx   . Immunodeficiency Neg Hx   . Urticaria Neg Hx     Social History   Socioeconomic History  . Marital status: Single    Spouse name: Not on file  . Number of children: Not on file  . Years of education: Not on file  . Highest education level: Not on file  Occupational History  . Not on file  Social Needs  . Financial resource strain: Not on file  . Food insecurity    Worry: Not on file    Inability: Not on file  . Transportation needs    Medical: Not on file    Non-medical: Not on file  Tobacco Use  . Smoking status: Never Smoker  . Smokeless tobacco: Never Used  Substance and Sexual Activity  . Alcohol use: No  . Drug use: No  . Sexual activity: Yes    Birth control/protection: Surgical  Lifestyle  . Physical activity    Days per week: Not on file    Minutes per session: Not on file  . Stress: Not on file  Relationships  . Social [place] phone: Not on file    Gets together: Not on file    Attends religious service: Not on file    Active member of club or organization: Not on file    Attends meetings of clubs or organizations: Not on file    Relationship status: Not on file  . Intimate partner violence    Fear of current or ex partner: Not on file    Emotionally abused: Not on file    Physically abused: Not on file    Forced sexual activity: Not on file  Other Topics Concern  . Not on file  Social History Narrative  . Not on file    I appreciate the opportunity to take part in Gabrielle Shaw's care. Please do not hesitate to contact me with questions.  Sincerely,    R. CEdgar Frisk MD

## 2019-10-04 NOTE — Telephone Encounter (Signed)
Patient received Egg Free Flu Vaccine in office today.

## 2019-10-04 NOTE — Assessment & Plan Note (Signed)
   Continue careful avoidance of egg, peanuts, shellfish, bananas, and strawberries as discussed and have access to epinephrine auto-injector 2 pack.  Food allergy action plan is in place.  Continue to have access to epinephrine 0.3 mg autoinjector 2 pack.

## 2019-10-04 NOTE — Assessment & Plan Note (Signed)
   The patient was able to tolerate the split dose, egg free influenza vaccine challenge today without adverse signs or symptoms. Therefore, she has the same risk of systemic reaction associated with egg free influenza vaccine as the general population.  In the future, she may receive the egg free influenza vaccine in a single dose.

## 2019-10-04 NOTE — Assessment & Plan Note (Signed)
   Continue appropriate allergen avoidance measures, aeroallergen immunotherapy injections, nasal saline as needed, and fluticasone nasal spray as needed.  For thick post nasal drainage, add guaifenesin [phone] mg (Mucinex)  twice daily as needed with adequate hydration as discussed.  Medications will be decreased or discontinued as symptom relief from immunotherapy becomes evident.

## 2019-10-11 ENCOUNTER — Ambulatory Visit (INDEPENDENT_AMBULATORY_CARE_PROVIDER_SITE_OTHER): Payer: Medicare Other

## 2019-10-11 ENCOUNTER — Other Ambulatory Visit: Payer: Self-pay

## 2019-10-11 DIAGNOSIS — J309 Allergic rhinitis, unspecified: Secondary | ICD-10-CM | POA: Diagnosis not present

## 2019-10-18 ENCOUNTER — Ambulatory Visit (INDEPENDENT_AMBULATORY_CARE_PROVIDER_SITE_OTHER): Payer: Medicare Other

## 2019-10-18 ENCOUNTER — Other Ambulatory Visit: Payer: Self-pay

## 2019-10-18 DIAGNOSIS — J309 Allergic rhinitis, unspecified: Secondary | ICD-10-CM | POA: Diagnosis not present

## 2019-10-25 ENCOUNTER — Other Ambulatory Visit: Payer: Self-pay

## 2019-10-25 ENCOUNTER — Ambulatory Visit (INDEPENDENT_AMBULATORY_CARE_PROVIDER_SITE_OTHER): Payer: Medicare Other

## 2019-10-25 DIAGNOSIS — J309 Allergic rhinitis, unspecified: Secondary | ICD-10-CM | POA: Diagnosis not present

## 2019-11-01 ENCOUNTER — Other Ambulatory Visit: Payer: Self-pay

## 2019-11-01 ENCOUNTER — Ambulatory Visit (INDEPENDENT_AMBULATORY_CARE_PROVIDER_SITE_OTHER): Payer: Medicare Other

## 2019-11-01 DIAGNOSIS — J309 Allergic rhinitis, unspecified: Secondary | ICD-10-CM | POA: Diagnosis not present

## 2019-11-10 ENCOUNTER — Other Ambulatory Visit: Payer: Self-pay

## 2019-11-10 ENCOUNTER — Ambulatory Visit (INDEPENDENT_AMBULATORY_CARE_PROVIDER_SITE_OTHER): Payer: Medicare Other

## 2019-11-10 DIAGNOSIS — J309 Allergic rhinitis, unspecified: Secondary | ICD-10-CM | POA: Diagnosis not present

## 2019-11-29 ENCOUNTER — Ambulatory Visit (INDEPENDENT_AMBULATORY_CARE_PROVIDER_SITE_OTHER): Payer: Medicare Other

## 2019-11-29 ENCOUNTER — Other Ambulatory Visit: Payer: Self-pay

## 2019-11-29 DIAGNOSIS — J309 Allergic rhinitis, unspecified: Secondary | ICD-10-CM

## 2019-12-20 ENCOUNTER — Ambulatory Visit (INDEPENDENT_AMBULATORY_CARE_PROVIDER_SITE_OTHER): Payer: Medicare Other

## 2019-12-20 ENCOUNTER — Other Ambulatory Visit: Payer: Self-pay

## 2019-12-20 DIAGNOSIS — J309 Allergic rhinitis, unspecified: Secondary | ICD-10-CM | POA: Diagnosis not present

## 2020-01-03 ENCOUNTER — Other Ambulatory Visit: Payer: Self-pay

## 2020-01-03 ENCOUNTER — Ambulatory Visit (INDEPENDENT_AMBULATORY_CARE_PROVIDER_SITE_OTHER): Payer: Medicare Other

## 2020-01-03 DIAGNOSIS — J309 Allergic rhinitis, unspecified: Secondary | ICD-10-CM | POA: Diagnosis not present

## 2020-01-24 ENCOUNTER — Ambulatory Visit (INDEPENDENT_AMBULATORY_CARE_PROVIDER_SITE_OTHER): Payer: Medicare Other

## 2020-01-24 ENCOUNTER — Other Ambulatory Visit: Payer: Self-pay

## 2020-01-24 DIAGNOSIS — J309 Allergic rhinitis, unspecified: Secondary | ICD-10-CM | POA: Diagnosis not present

## 2020-01-30 DIAGNOSIS — J301 Allergic rhinitis due to pollen: Secondary | ICD-10-CM

## 2020-01-30 NOTE — Progress Notes (Signed)
EXP 01/29/21

## 2020-01-31 DIAGNOSIS — J3089 Other allergic rhinitis: Secondary | ICD-10-CM | POA: Diagnosis not present

## 2020-02-07 ENCOUNTER — Ambulatory Visit (INDEPENDENT_AMBULATORY_CARE_PROVIDER_SITE_OTHER): Payer: Medicare Other | Admitting: Allergy and Immunology

## 2020-02-07 ENCOUNTER — Other Ambulatory Visit: Payer: Self-pay

## 2020-02-07 ENCOUNTER — Encounter: Payer: Self-pay | Admitting: Allergy and Immunology

## 2020-02-07 VITALS — BP 124/70 | HR 66 | Temp 97.3°F | Resp 16

## 2020-02-07 DIAGNOSIS — J309 Allergic rhinitis, unspecified: Secondary | ICD-10-CM

## 2020-02-07 DIAGNOSIS — K219 Gastro-esophageal reflux disease without esophagitis: Secondary | ICD-10-CM

## 2020-02-07 DIAGNOSIS — T7800XD Anaphylactic reaction due to unspecified food, subsequent encounter: Secondary | ICD-10-CM

## 2020-02-07 DIAGNOSIS — J3089 Other allergic rhinitis: Secondary | ICD-10-CM

## 2020-02-07 DIAGNOSIS — J4540 Moderate persistent asthma, uncomplicated: Secondary | ICD-10-CM

## 2020-02-07 DIAGNOSIS — B349 Viral infection, unspecified: Secondary | ICD-10-CM | POA: Insufficient documentation

## 2020-02-07 MED ORDER — IVERMECTIN 3 MG PO TABS: ORAL_TABLET | ORAL | 0 refills | Status: DC

## 2020-02-07 MED ORDER — IVERMECTIN 3 MG PO TABS: ORAL_TABLET | ORAL | 2 refills | Status: DC

## 2020-02-07 NOTE — Assessment & Plan Note (Signed)
   Continue careful avoidance of egg, peanuts, shellfish, bananas, and strawberries as discussed and have access to epinephrine auto-injector 2 pack.  Food allergy action plan is in place.  Continue to have access to epinephrine 0.3 mg autoinjector 2 pack.

## 2020-02-07 NOTE — Assessment & Plan Note (Signed)
Stable.  For now, continue Symbicort (budesonide/formoterol) 160/4.5 g, 2 inhalations via spacer device twice a day.  During respiratory tract infections or asthma flares, add Flovent 110g 2 inhalations 2 times per day until symptoms have returned to baseline.  Continue albuterol HFA, 1 to 2 inhalations every 4-6 hours as needed.  Subjective and objective measures of pulmonary function will be followed and the treatment plan will be adjusted accordingly.

## 2020-02-07 NOTE — Assessment & Plan Note (Signed)
   Continue appropriate reflux lifestyle modifications and famotidine daily.  If needed, may take famotidine twice daily.

## 2020-02-07 NOTE — Assessment & Plan Note (Signed)
   Continue appropriate allergen avoidance measures, aeroallergen immunotherapy injections, nasal saline as needed, and fluticasone nasal spray as needed.  For thick post nasal drainage, add guaifenesin [phone] mg (Mucinex)  twice daily as needed with adequate hydration as discussed.  Medications will be decreased or discontinued as symptom relief from immunotherapy becomes evident.

## 2020-02-07 NOTE — Assessment & Plan Note (Addendum)
The current Covid-19 vaccines, and vaccines awaiting emergency use authorization, were discussed and questions answered. CDC.gov website is a good reference for further questions. The patient has multiple Covid-19 risk factors and is awaiting/considering Covid-19 vaccination.  As such, the risk to benefit ratio of ivermectin for post-exposure prophylaxis, and early treatment, has been discussed. The patient has requested a prescription for ivermectin postexposure prophylaxis and/or early treatment. The protocols are based upon the research analysis and recommendations of the [place] ([employer]).   A prescription has been provided for ivermectin 3 mg tablets.  If exposed but no symptoms, take post-exposure prophylaxis:  Take 5 1/2  tablets per day, 48 hours apart.  If Covid-19 symptoms develop, contact me.  Vitamin D3 1000-3000 IU daily  Vitamin C 1000 mg twice daily  Zinc 40-50 mg daily.  Quercetin 250 mg daily  Melatonin 6 mg at bedtime (causes drowsiness).   If experiencing symptoms, take early outpatient treatment:  5 1/2 tablets daily for a minimum of 2 days, continue daily until recovered (maximum of 5 days).  Vitamin D3 4000 IU daily  Vitamin C 2000 mg 2-3 times per day  Zinc 100 mg daily.  Quercetin 250 mg twice daily  Melatonin 10 mg at bedtime (causes drowsiness).   Aspirin 325 mg/day (unless contraindicated)  Pulse Oximeter monitoring is recommended. A baseline or ambulatory pulse ox <94% should prompt hospital admission.

## 2020-02-07 NOTE — Patient Instructions (Addendum)
Moderate persistent asthma Stable.  For now, continue Symbicort (budesonide/formoterol) 160/4.5 g, 2 inhalations via spacer device twice a day.  During respiratory tract infections or asthma flares, add Flovent 110g 2 inhalations 2 times per day until symptoms have returned to baseline.  Continue albuterol HFA, 1 to 2 inhalations every 4-6 hours as needed.  Subjective and objective measures of pulmonary function will be followed and the treatment plan will be adjusted accordingly.  Seasonal and perennial allergic rhinitis  Continue appropriate allergen avoidance measures, aeroallergen immunotherapy injections, nasal saline as needed, and fluticasone nasal spray as needed.  For thick post nasal drainage, add guaifenesin [phone] mg (Mucinex)  twice daily as needed with adequate hydration as discussed.  Medications will be decreased or discontinued as symptom relief from immunotherapy becomes evident.  Food allergy  Continue careful avoidance of egg, peanuts, shellfish, bananas, and strawberries as discussed and have access to epinephrine auto-injector 2 pack.  Food allergy action plan is in place.  Continue to have access to epinephrine 0.3 mg autoinjector 2 pack.  GERD (gastroesophageal reflux disease)  Continue appropriate reflux lifestyle modifications and famotidine daily.  If needed, may take famotidine twice daily.  Viral post-exposure prophylaxis/early treatment The current Covid-19 vaccines, and vaccines awaiting emergency use authorization, were discussed and questions answered. CDC.gov website is a good reference for further questions. The patient has multiple Covid-19 risk factors and is awaiting/considering Covid-19 vaccination.  As such, the risk to benefit ratio of ivermectin for post-exposure prophylaxis, and early treatment, has been discussed. The patient has requested a prescription for ivermectin postexposure prophylaxis and/or early treatment. The protocols are  based upon the research analysis and recommendations of the [place] ([employer]).   A prescription has been provided for ivermectin 3 mg tablets.  If exposed but no symptoms, take post-exposure prophylaxis:  Take 5 1/2  tablets per day, 48 hours apart.  If Covid-19 symptoms develop, contact me.  Vitamin D3 1000-3000 IU daily  Vitamin C 1000 mg twice daily  Zinc 40-50 mg daily.  Quercetin 250 mg daily  Melatonin 6 mg at bedtime (causes drowsiness).   If experiencing symptoms, take early outpatient treatment:  5 1/2 tablets daily for a minimum of 2 days, continue daily until recovered (maximum of 5 days).  Vitamin D3 4000 IU daily  Vitamin C 2000 mg 2-3 times per day  Zinc 100 mg daily.  Quercetin 250 mg twice daily  Melatonin 10 mg at bedtime (causes drowsiness).   Aspirin 325 mg/day (unless contraindicated)  Pulse Oximeter monitoring is recommended. A baseline or ambulatory pulse ox <94% should prompt hospital admission.    Return in about 4 months (around 06/06/2020), or if symptoms worsen or fail to improve.

## 2020-02-07 NOTE — Progress Notes (Signed)
Follow-up Note  RE: Gabrielle Shaw MRN: [id number] DOB: December 27, 1960 Date of Office Visit: 02/07/2020  Primary care provider: [name], MD Referring provider: [name], MD  History of present illness: Gabrielle Shaw is a 59 y.o. female with persistent asthma, allergic rhinitis, food allergy, and gastroesophageal reflux presenting today for follow-up.  She was last seen in this clinic in October 2020.  She reports that her asthma is well controlled while taking Symbicort 160-4.5 g, 2 inhalations via spacer device twice daily.  She rarely requires albuterol rescue and does not experience limitations in normal daily activities.  She experiences nocturnal awakenings due to lower respiratory symptoms 2 times per month on average.  She reports that she ran out of fluticasone nasal spray several weeks ago and has not been using nasal saline recently either.  She has been using a cool mist humidifier.  She expresses concerns because of risk factors for COVID-19, including hypertension, diabetes mellitus, and respiratory disease.  She is uncertain when the vaccine might be available for her, and she also has questions and concerns because she has had reactions to vaccines in the past.  Assessment and plan: Moderate persistent asthma Stable.  For now, continue Symbicort (budesonide/formoterol) 160/4.5 g, 2 inhalations via spacer device twice a day.  During respiratory tract infections or asthma flares, add Flovent 110g 2 inhalations 2 times per day until symptoms have returned to baseline.  Continue albuterol HFA, 1 to 2 inhalations every 4-6 hours as needed.  Subjective and objective measures of pulmonary function will be followed and the treatment plan will be adjusted accordingly.  Seasonal and perennial allergic rhinitis  Continue appropriate allergen avoidance measures, aeroallergen immunotherapy injections, nasal saline as needed, and fluticasone nasal spray as needed.  For  thick post nasal drainage, add guaifenesin [phone] mg (Mucinex)  twice daily as needed with adequate hydration as discussed.  Medications will be decreased or discontinued as symptom relief from immunotherapy becomes evident.  Food allergy  Continue careful avoidance of egg, peanuts, shellfish, bananas, and strawberries as discussed and have access to epinephrine auto-injector 2 pack.  Food allergy action plan is in place.  Continue to have access to epinephrine 0.3 mg autoinjector 2 pack.  GERD (gastroesophageal reflux disease)  Continue appropriate reflux lifestyle modifications and famotidine daily.  If needed, may take famotidine twice daily.  Viral post-exposure prophylaxis/early treatment The current Covid-19 vaccines, and vaccines awaiting emergency use authorization, were discussed and questions answered. CDC.gov website is a good reference for further questions. The patient has multiple Covid-19 risk factors and is awaiting/considering Covid-19 vaccination.  As such, the risk to benefit ratio of ivermectin for post-exposure prophylaxis, and early treatment, has been discussed. The patient has requested a prescription for ivermectin postexposure prophylaxis and/or early treatment. The protocols are based upon the research analysis and recommendations of the [place] ([employer]).   A prescription has been provided for ivermectin 3 mg tablets.  If exposed but no symptoms, take post-exposure prophylaxis:  Take 5 1/2  tablets per day, 48 hours apart.  If Covid-19 symptoms develop, contact me.  Vitamin D3 1000-3000 IU daily  Vitamin C 1000 mg twice daily  Zinc 40-50 mg daily.  Quercetin 250 mg daily  Melatonin 6 mg at bedtime (causes drowsiness).   If experiencing symptoms, take early outpatient treatment:  5 1/2 tablets daily for a minimum of 2 days, continue daily until recovered (maximum of 5 days).  Vitamin D3 4000 IU  daily  Vitamin C 2000 mg 2-3 times per day  Zinc 100 mg daily.  Quercetin 250 mg twice daily  Melatonin 10 mg at bedtime (causes drowsiness).   Aspirin 325 mg/day (unless contraindicated)  Pulse Oximeter monitoring is recommended. A baseline or ambulatory pulse ox <94% should prompt hospital admission.    Meds ordered this encounter  Medications  . DISCONTD: ivermectin (STROMECTOL) 3 MG TABS tablet    Sig: Take 18 MG (6 tablets) 48 hours apart if exposed.    Dispense:  12 tablet    Refill:  2  . ivermectin (STROMECTOL) 3 MG TABS tablet    Sig: Take as directed.    Dispense:  30 tablet    Refill:  0    Diagnostics: Spirometry:  Normal with an FEV1 of 120% predicted. This study was performed while the patient was asymptomatic.  Please see scanned spirometry results for details.    Physical examination: Blood pressure 124/70, pulse 66, temperature (!) 97.3 F (36.3 C), temperature source Temporal, resp. rate 16, SpO2 96 %.  General: Alert, interactive, in no acute distress. HEENT: TMs pearly gray, turbinates mildly edematous without discharge, post-pharynx unremarkable. Neck: Supple without lymphadenopathy. Lungs: Clear to auscultation without wheezing, rhonchi or rales. CV: Normal S1, S2 without murmurs. Skin: Warm and dry, without lesions or rashes.  The following portions of the patient's history were reviewed and updated as appropriate: allergies, current medications, past family history, past medical history, past social history, past surgical history and problem list.   Current Outpatient Medications  Medication Sig Dispense Refill  . albuterol (PROAIR HFA) 108 (90 Base) MCG/ACT inhaler Inhale 2 puffs into the lungs every 4 (four) hours as needed for wheezing or shortness of breath. 1 Inhaler 1  . albuterol (PROVENTIL) (2.5 MG/3ML) 0.083% nebulizer solution Take 3 mLs (2.5 mg total) by nebulization as needed. 75 mL 1  . aspirin EC 81 MG tablet Take 81 mg by mouth  as needed.     . Blood Glucose Monitoring Suppl (ONETOUCH VERIO) w/Device KIT by Does not apply route.    . budesonide-formoterol (SYMBICORT) 160-4.5 MCG/ACT inhaler 2 puffs twice daily to prevent coughing or wheezing. Rinse gargle and spit. 1 Inhaler 5  . Dapsone (ACZONE) 5 % topical gel Apply to face daily as needed.    . desonide (DESOWEN) 0.05 % ointment Apply to the scalp once daily as needed for itching.    . diphenhydrAMINE (BENADRYL) 50 MG tablet Take 50 mg by mouth every 6 (six) hours as needed for itching.    . donepezil (ARICEPT) 5 MG tablet Take by mouth.    . EPINEPHrine (EPIPEN 2-PAK) 0.3 mg/0.3 mL IJ SOAJ injection USE AS DIRECTED FOR SEVERE ALLERGIC REACTION 2 Device 2  . famotidine (PEPCID) 20 MG tablet Take 1 tablet (20 mg total) by mouth daily. 30 tablet 5  . finasteride (PROSCAR) 5 MG tablet Take one tablet by mouth daily    . fluocinonide ointment (LIDEX) 0.05 % APPLY OINTMENT TO SCALP 2 TO 3 TIMES PER WEEK **DO NOT APPLY TO FACE**    . fluticasone (FLONASE) 50 MCG/ACT nasal spray 1 spray per nostril 2 times daily as needed for stuffy nose. 16 g 5  . fluticasone (FLOVENT HFA) 110 MCG/ACT inhaler Inhale 2 puffs into the lungs 2 (two) times daily. 1 Inhaler 2  . hydrOXYzine (ATARAX/VISTARIL) 25 MG tablet ONE TABLET AT BEDTIME AS NEEDED FOR ITCHING. 30 tablet 3  . ibuprofen (ADVIL,MOTRIN) 800 MG tablet Take 1 tablet (  800 mg total) by mouth every 8 (eight) hours as needed for mild pain. 30 tablet 0  . levocetirizine (XYZAL) 5 MG tablet Take 1 tablet (5 mg total) by mouth every evening. 30 tablet 5  . lisinopril-hydrochlorothiazide (PRINZIDE,ZESTORETIC) 10-12.5 MG tablet Take 0.5 tablets by mouth.     . meloxicam (MOBIC) 7.5 MG tablet Take by mouth.    . methocarbamol (ROBAXIN) 500 MG tablet Take 500 mg by mouth 3 (three) times daily.    . metroNIDAZOLE (METROGEL) 1 % gel Use as needed for rosacea    . NON FORMULARY     . Olopatadine HCl 0.2 % SOLN INSTILL ONE DROP INTO EACH EYE  ONCE DAILY AS NEEDED FOR ITCHY EYES 2.5 mL 2  . potassium chloride (K-DUR,KLOR-CON) 10 MEQ tablet Take by mouth.    . pregabalin (LYRICA) 100 MG capsule Take by mouth.    . traMADol (ULTRAM) 50 MG tablet TAKE 1 TABLET BY MOUTH EVERY 6 HOURS AS NEEDED FOR PAIN FOR UP TO 5 DAYS    . ivermectin (STROMECTOL) 3 MG TABS tablet Take as directed. 30 tablet 0  . lisinopril-hydrochlorothiazide (ZESTORETIC) 10-12.5 MG tablet Take by mouth.     No current facility-administered medications for this visit.    Allergies  Allergen Reactions  . Iodinated Diagnostic Agents Anaphylaxis    Cardiac Arrest Seizure  . Other Hives  . Shellfish Allergy Hives  . Sulfasalazine Hives  . Tetrofosmin [Technetium-23m Hives and Anaphylaxis  . Banana Hives  . Eggs Or Egg-Derived Products Nausea And Vomiting  . Strawberry Extract Hives  . Sulfamethoxazole Rash  . Biaxin [Clarithromycin] Hives  . Latex Hives  . Peanuts [Peanut Oil] Hives  . Pravastatin Hives  . Shellfish-Derived Products Hives  . Sulfa Antibiotics Hives and Rash    I appreciate the opportunity to take part in Jadore's care. Please do not hesitate to contact me with questions.  Sincerely,   R. CEdgar Frisk MD

## 2020-02-14 ENCOUNTER — Telehealth: Payer: Self-pay

## 2020-02-14 MED ORDER — IVERMECTIN 3 MG PO TABS: ORAL_TABLET | ORAL | 0 refills | Status: DC

## 2020-02-14 NOTE — Telephone Encounter (Signed)
Re sent  script. 

## 2020-02-14 NOTE — Telephone Encounter (Signed)
Received a fax from the pharmacy about ivermectin (Stromectol) 3 MG tab. It needs to have a specific direction on how to take. I'm putting in another script with specific instructions on how to take.

## 2020-02-28 ENCOUNTER — Ambulatory Visit (INDEPENDENT_AMBULATORY_CARE_PROVIDER_SITE_OTHER): Payer: Medicare Other

## 2020-02-28 ENCOUNTER — Other Ambulatory Visit: Payer: Self-pay

## 2020-02-28 DIAGNOSIS — J309 Allergic rhinitis, unspecified: Secondary | ICD-10-CM

## 2020-03-06 ENCOUNTER — Ambulatory Visit (INDEPENDENT_AMBULATORY_CARE_PROVIDER_SITE_OTHER): Payer: Medicare Other

## 2020-03-06 ENCOUNTER — Other Ambulatory Visit: Payer: Self-pay

## 2020-03-06 DIAGNOSIS — J309 Allergic rhinitis, unspecified: Secondary | ICD-10-CM

## 2020-03-13 ENCOUNTER — Ambulatory Visit (INDEPENDENT_AMBULATORY_CARE_PROVIDER_SITE_OTHER): Payer: Medicare Other

## 2020-03-13 ENCOUNTER — Other Ambulatory Visit: Payer: Self-pay

## 2020-03-13 DIAGNOSIS — J309 Allergic rhinitis, unspecified: Secondary | ICD-10-CM | POA: Diagnosis not present

## 2020-03-20 ENCOUNTER — Other Ambulatory Visit: Payer: Self-pay

## 2020-03-20 ENCOUNTER — Ambulatory Visit: Payer: Medicare Other

## 2020-03-27 ENCOUNTER — Ambulatory Visit (INDEPENDENT_AMBULATORY_CARE_PROVIDER_SITE_OTHER): Payer: Medicare Other

## 2020-03-27 ENCOUNTER — Other Ambulatory Visit: Payer: Self-pay

## 2020-03-27 ENCOUNTER — Telehealth: Payer: Self-pay

## 2020-03-27 DIAGNOSIS — J309 Allergic rhinitis, unspecified: Secondary | ICD-10-CM | POA: Diagnosis not present

## 2020-03-27 NOTE — Telephone Encounter (Signed)
Patient came into the clinic to receive her allergy injections and informed me that her children are wanting her to get the Covid vaccine. Patient is not really wanting to get it however her children feel she should. Patient is wondering which vaccine Dr. Verlin Fester recommends her to get. She stated that with her allergies and history she doesn't want to get just anyone but one that Dr. Verlin Fester recommends for her. Please advise.

## 2020-03-28 NOTE — Telephone Encounter (Signed)
I spoke with the patient on the telephone and answered her questions.

## 2020-03-29 NOTE — Telephone Encounter (Signed)
Noted! Thank you

## 2020-04-03 ENCOUNTER — Other Ambulatory Visit: Payer: Self-pay

## 2020-04-03 ENCOUNTER — Ambulatory Visit (INDEPENDENT_AMBULATORY_CARE_PROVIDER_SITE_OTHER): Payer: Medicare Other

## 2020-04-03 DIAGNOSIS — J309 Allergic rhinitis, unspecified: Secondary | ICD-10-CM

## 2020-04-12 ENCOUNTER — Ambulatory Visit (INDEPENDENT_AMBULATORY_CARE_PROVIDER_SITE_OTHER): Payer: Medicare Other

## 2020-04-12 ENCOUNTER — Other Ambulatory Visit: Payer: Self-pay

## 2020-04-12 DIAGNOSIS — J309 Allergic rhinitis, unspecified: Secondary | ICD-10-CM

## 2020-04-17 ENCOUNTER — Other Ambulatory Visit: Payer: Self-pay

## 2020-04-17 ENCOUNTER — Ambulatory Visit (INDEPENDENT_AMBULATORY_CARE_PROVIDER_SITE_OTHER): Payer: Medicare Other

## 2020-04-17 DIAGNOSIS — J309 Allergic rhinitis, unspecified: Secondary | ICD-10-CM | POA: Diagnosis not present

## 2020-05-01 ENCOUNTER — Other Ambulatory Visit: Payer: Self-pay

## 2020-05-01 ENCOUNTER — Ambulatory Visit (INDEPENDENT_AMBULATORY_CARE_PROVIDER_SITE_OTHER): Payer: Medicare Other

## 2020-05-01 DIAGNOSIS — J309 Allergic rhinitis, unspecified: Secondary | ICD-10-CM

## 2020-05-15 ENCOUNTER — Other Ambulatory Visit: Payer: Self-pay

## 2020-05-15 ENCOUNTER — Ambulatory Visit (INDEPENDENT_AMBULATORY_CARE_PROVIDER_SITE_OTHER): Payer: Medicare Other

## 2020-05-15 DIAGNOSIS — J309 Allergic rhinitis, unspecified: Secondary | ICD-10-CM | POA: Diagnosis not present

## 2020-05-29 ENCOUNTER — Ambulatory Visit (INDEPENDENT_AMBULATORY_CARE_PROVIDER_SITE_OTHER): Payer: Medicare Other

## 2020-05-29 ENCOUNTER — Other Ambulatory Visit: Payer: Self-pay

## 2020-05-29 DIAGNOSIS — J309 Allergic rhinitis, unspecified: Secondary | ICD-10-CM | POA: Diagnosis not present

## 2020-05-31 DIAGNOSIS — J301 Allergic rhinitis due to pollen: Secondary | ICD-10-CM

## 2020-05-31 NOTE — Progress Notes (Signed)
EXP 05/31/21

## 2020-06-11 DIAGNOSIS — J3089 Other allergic rhinitis: Secondary | ICD-10-CM | POA: Diagnosis not present

## 2020-06-12 ENCOUNTER — Ambulatory Visit (INDEPENDENT_AMBULATORY_CARE_PROVIDER_SITE_OTHER): Payer: Medicare Other

## 2020-06-12 ENCOUNTER — Other Ambulatory Visit: Payer: Self-pay

## 2020-06-12 DIAGNOSIS — J309 Allergic rhinitis, unspecified: Secondary | ICD-10-CM

## 2020-06-26 ENCOUNTER — Ambulatory Visit: Payer: Medicare Other | Admitting: Allergy and Immunology

## 2020-07-05 ENCOUNTER — Ambulatory Visit (INDEPENDENT_AMBULATORY_CARE_PROVIDER_SITE_OTHER): Payer: Medicare Other

## 2020-07-05 ENCOUNTER — Other Ambulatory Visit: Payer: Self-pay

## 2020-07-05 DIAGNOSIS — J309 Allergic rhinitis, unspecified: Secondary | ICD-10-CM

## 2020-07-17 ENCOUNTER — Other Ambulatory Visit: Payer: Self-pay

## 2020-07-17 ENCOUNTER — Ambulatory Visit (INDEPENDENT_AMBULATORY_CARE_PROVIDER_SITE_OTHER): Payer: Medicare Other

## 2020-07-17 DIAGNOSIS — J309 Allergic rhinitis, unspecified: Secondary | ICD-10-CM | POA: Diagnosis not present

## 2020-07-26 ENCOUNTER — Ambulatory Visit (INDEPENDENT_AMBULATORY_CARE_PROVIDER_SITE_OTHER): Payer: Medicare Other

## 2020-07-26 ENCOUNTER — Other Ambulatory Visit: Payer: Self-pay

## 2020-07-26 DIAGNOSIS — J309 Allergic rhinitis, unspecified: Secondary | ICD-10-CM

## 2020-08-02 ENCOUNTER — Other Ambulatory Visit: Payer: Self-pay

## 2020-08-02 ENCOUNTER — Ambulatory Visit (INDEPENDENT_AMBULATORY_CARE_PROVIDER_SITE_OTHER): Payer: Medicare Other

## 2020-08-02 DIAGNOSIS — J309 Allergic rhinitis, unspecified: Secondary | ICD-10-CM | POA: Diagnosis not present

## 2020-08-07 ENCOUNTER — Other Ambulatory Visit: Payer: Self-pay

## 2020-08-07 ENCOUNTER — Ambulatory Visit (INDEPENDENT_AMBULATORY_CARE_PROVIDER_SITE_OTHER): Payer: Medicare Other

## 2020-08-07 DIAGNOSIS — J309 Allergic rhinitis, unspecified: Secondary | ICD-10-CM | POA: Diagnosis not present

## 2020-08-13 NOTE — Progress Notes (Signed)
Follow Up Note  RE: Gabrielle Shaw MRN: [id number] DOB: [birth year] Date of Office Visit: 08/14/2020  Referring provider: [name], MD Primary care provider: [name], MD  Chief Complaint: follow up  History of Present Illness: I had the pleasure of seeing Gabrielle Shaw for a follow up visit at the [facility] on 08/14/2020. She is a 59 y.o. female, who is being followed for asthma, allergic rhinitis on AIT, food allergy, GERD. Her previous allergy office visit was on 02/07/2020 with Dr. Verlin Fester. Today is a regular follow up visit.Up to date with COVID-19 vaccine: yes  Moderate persistent asthma ACT score 17. Currently on Symbicort 153mg 2 puffs twice a day Patient has been staying indoors most of the time. Using albuterol when going outdoors in the hot/humid weather which causes shortness of breath.  Uses albuterol a few times per week with good benefit.   Seasonal and perennial allergic rhinitis Patient is on allergy injections every 3 weeks with good benefit.  Uses olopatadine in the mornings for watery/itchy eyes with good benefit. Sometimes uses it at night as well.  Last eye exam was in January 2021.  Using Flonase 1 spray   Food allergy Avoiding egg, peanuts, shellfish, bananas, and strawberries and no reactions since the last visit.  GERD (gastroesophageal reflux disease) Takes famotidine twice a day but interested in switching to a PPI.  Assessment and Plan: [name] a 59y.o. female with: Moderate persistent asthma Stable.  Act score 17.  Today's spirometry was unremarkable. . Daily controller medication(s): Symbicort 16107m 2 puffs twice a day with spacer and rinse mouth afterwards. . Prior to physical activity: May use albuterol rescue inhaler 2 puffs 5 to 15 minutes prior to strenuous physical activities. . Marland Kitchenescue medications: May use albuterol rescue inhaler 2 puffs or nebulizer every 4 to 6 hours as needed for shortness of  breath, chest tightness, coughing, and wheezing. Monitor frequency of use.  . During upper respiratory infections/asthma flares: start Flovent 11092m2 puffs twice a day with spacer and rinse mouth afterwards for 1-2 weeks until your breathing symptoms return to baseline.   Seasonal and perennial allergic rhinoconjunctivitis Past history - on AIT (cat-tree-grass + mite-weed) Interim history - stable but using eye drops more often than prescribed.  Had eye exam in January 2021 per patient report.  Continue environmental control measures.  Continue allergy injections.  May use Flonase (fluticasone) nasal spray 2 sprays per nostril once a day as needed for nasal congestion.   May use olopatadine eye drops 0.2% ONCE a day as needed for itchy/watery eyes.  You may use over the counter refresh eye drops additionally if needed.   Nasal saline spray (i.e., Simply Saline) or nasal saline lavage (i.e., NeilMed) is recommended as needed and prior to medicated nasal sprays.  May use over the counter antihistamines such as Zyrtec (cetirizine), Claritin (loratadine), Allegra (fexofenadine), or Xyzal (levocetirizine) daily as needed.  Food allergy No reactions since last visit. Food allergy  Continue careful avoidance of egg, peanuts, shellfish, bananas, and strawberries.  For mild symptoms you can take over the counter antihistamines such as Benadryl and monitor symptoms closely. If symptoms worsen or if you have severe symptoms including breathing issues, throat closure, significant swelling, whole body hives, severe diarrhea and vomiting, lightheadedness then inject epinephrine and seek immediate medical care afterwards.  Food allergy action plan is in place.  GERD (gastroesophageal reflux disease) Wants to try PPI.  Continue appropriate reflux lifestyle  modifications.  Stop Famotidine.  Start omeprazole 19m one tablet in the morning. Nothing to eat or drink for 30 minutes afterwards. See  if this new medication controls your reflux better.   Return in about 6 months (around 02/14/2021).  Meds ordered this encounter  Medications  . Olopatadine HCl 0.2 % SOLN    Sig: Apply 1 drop to eye daily as needed (itchy/watery eyes). INSTILL ONE DROP INTO EACH EYE ONCE DAILY AS NEEDED FOR ITCHY EYES    Dispense:  2.5 mL    Refill:  5    Please consider 90 day supplies to promote better adherence  . fluticasone (FLONASE) 50 MCG/ACT nasal spray    Sig: Place 2 sprays into both nostrils daily.    Dispense:  16 g    Refill:  5  . EPINEPHrine (EPIPEN 2-PAK) 0.3 mg/0.3 mL IJ SOAJ injection    Sig: USE AS DIRECTED FOR SEVERE ALLERGIC REACTION    Dispense:  1 each    Refill:  2    Please dispense Mylan generic brand.  .Marland Kitchenomeprazole (PRILOSEC) 20 MG capsule    Sig: Take 1 capsule (20 mg total) by mouth in the morning.    Dispense:  30 capsule    Refill:  5   Diagnostics: Spirometry:  Tracings reviewed. Her effort: Good reproducible efforts. FVC: 1.99L FEV1: 1.76L, 102% predicted FEV1/FVC ratio: 88% Interpretation: Spirometry consistent with normal pattern.  Please see scanned spirometry results for details.  Medication List:  Current Outpatient Medications  Medication Sig Dispense Refill  . albuterol (PROAIR HFA) 108 (90 Base) MCG/ACT inhaler Inhale 2 puffs into the lungs every 4 (four) hours as needed for wheezing or shortness of breath. 1 Inhaler 1  . albuterol (PROVENTIL) (2.5 MG/3ML) 0.083% nebulizer solution Take 3 mLs (2.5 mg total) by nebulization as needed. 75 mL 1  . aspirin EC 81 MG tablet Take 81 mg by mouth as needed.     . Blood Glucose Monitoring Suppl (ONETOUCH VERIO) w/Device KIT by Does not apply route.    . budesonide-formoterol (SYMBICORT) 160-4.5 MCG/ACT inhaler 2 puffs twice daily to prevent coughing or wheezing. Rinse gargle and spit. 1 Inhaler 5  . Dapsone (ACZONE) 5 % topical gel Apply to face daily as needed.    . desonide (DESOWEN) 0.05 % ointment Apply to  the scalp once daily as needed for itching.    . diphenhydrAMINE (BENADRYL) 50 MG tablet Take 50 mg by mouth every 6 (six) hours as needed for itching.    . donepezil (ARICEPT) 5 MG tablet Take by mouth.    . doxycycline (VIBRAMYCIN) 100 MG capsule Take one tablet daily with food and a glass of water.    . empagliflozin (JARDIANCE) 10 MG TABS tablet Take 1 tablet by mouth daily.    .Marland KitchenEPINEPHrine (EPIPEN 2-PAK) 0.3 mg/0.3 mL IJ SOAJ injection USE AS DIRECTED FOR SEVERE ALLERGIC REACTION 1 each 2  . famotidine (PEPCID) 20 MG tablet Take 1 tablet (20 mg total) by mouth daily. 30 tablet 5  . finasteride (PROSCAR) 5 MG tablet Take one tablet by mouth daily    . fluocinonide ointment (LIDEX) 0.05 % APPLY OINTMENT TO SCALP 2 TO 3 TIMES PER WEEK **DO NOT APPLY TO FACE**    . fluticasone (FLONASE) 50 MCG/ACT nasal spray Place 2 sprays into both nostrils daily. 16 g 5  . fluticasone (FLOVENT HFA) 110 MCG/ACT inhaler Inhale 2 puffs into the lungs 2 (two) times daily. 1 Inhaler 2  .  HYDROcodone-acetaminophen (NORCO/VICODIN) 5-325 MG tablet Take 1 tablet by mouth every 6 (six) hours as needed.    . hydrOXYzine (ATARAX/VISTARIL) 25 MG tablet ONE TABLET AT BEDTIME AS NEEDED FOR ITCHING. 30 tablet 3  . ibuprofen (ADVIL,MOTRIN) 800 MG tablet Take 1 tablet (800 mg total) by mouth every 8 (eight) hours as needed for mild pain. 30 tablet 0  . ivermectin (STROMECTOL) 3 MG TABS tablet If exposed but no symptoms: Take 5 1/2 tablets per day 48 hours apart If experiencing symptoms: Take 5/12 tablets daily a minimum of 2 days, continue recovered (maximium 5 days) 30 tablet 0  . levocetirizine (XYZAL) 5 MG tablet Take 1 tablet (5 mg total) by mouth every evening. 30 tablet 5  . lisinopril-hydrochlorothiazide (PRINZIDE,ZESTORETIC) 10-12.5 MG tablet Take 0.5 tablets by mouth.     . meloxicam (MOBIC) 7.5 MG tablet Take by mouth.    . methocarbamol (ROBAXIN) 500 MG tablet Take 500 mg by mouth 3 (three) times daily.    .  metroNIDAZOLE (METROGEL) 1 % gel Use as needed for rosacea    . minoxidil (LONITEN) 2.5 MG tablet SMARTSIG:.5 Tablet(s) By Mouth Daily    . NON FORMULARY     . Olopatadine HCl 0.2 % SOLN Apply 1 drop to eye daily as needed (itchy/watery eyes). INSTILL ONE DROP INTO EACH EYE ONCE DAILY AS NEEDED FOR ITCHY EYES 2.5 mL 5  . potassium chloride (K-DUR,KLOR-CON) 10 MEQ tablet Take by mouth.    . potassium chloride (KLOR-CON) 10 MEQ tablet Take 10 mEq by mouth 2 (two) times daily.    . pregabalin (LYRICA) 100 MG capsule Take by mouth.    . traMADol (ULTRAM) 50 MG tablet TAKE 1 TABLET BY MOUTH EVERY 6 HOURS AS NEEDED FOR PAIN FOR UP TO 5 DAYS    . triamcinolone acetonide (KENALOG-40) 40 MG/ML injection (RADIOLOGY ONLY) Inject into the articular space.    Marland Kitchen lisinopril-hydrochlorothiazide (ZESTORETIC) 10-12.5 MG tablet Take by mouth.    Marland Kitchen omeprazole (PRILOSEC) 20 MG capsule Take 1 capsule (20 mg total) by mouth in the morning. 30 capsule 5   No current facility-administered medications for this visit.   Allergies: Allergies  Allergen Reactions  . Iodinated Diagnostic Agents Anaphylaxis    Cardiac Arrest Seizure  . Other Hives  . Shellfish Allergy Hives  . Sulfasalazine Hives  . Tetrofosmin [Technetium-56m Hives and Anaphylaxis  . Banana Hives  . Eggs Or Egg-Derived Products Nausea And Vomiting  . Strawberry Extract Hives  . Sulfamethoxazole Rash  . Biaxin [Clarithromycin] Hives  . Latex Hives  . Peanuts [Peanut Oil] Hives  . Pravastatin Hives  . Shellfish-Derived Products Hives  . Sulfa Antibiotics Hives and Rash   I reviewed her past medical history, social history, family history, and environmental history and no significant changes have been reported from her previous visit.  Review of Systems  Constitutional: Negative for appetite change, chills, fever and unexpected weight change.  HENT: Negative for congestion and rhinorrhea.   Eyes: Negative for itching.  Respiratory: Negative  for cough, chest tightness, shortness of breath and wheezing.   Gastrointestinal: Negative for abdominal pain.  Skin: Negative for rash.  Allergic/Immunologic: Positive for environmental allergies and food allergies.  Neurological: Negative for headaches.   Objective: BP 114/72 (BP Location: Left Arm, Patient Position: Sitting, Cuff Size: Large)   Pulse 66   Resp 16   Ht 5' 0.63" (1.54 m)   Wt 178 lb 6.4 oz (80.9 kg)   SpO2 96%   BMI  34.12 kg/m  Body mass index is 34.12 kg/m. Physical Exam Vitals and nursing note reviewed.  Constitutional:      Appearance: Normal appearance. She is well-developed.  HENT:     Head: Normocephalic and atraumatic.     Right Ear: Tympanic membrane and external ear normal.     Left Ear: Tympanic membrane and external ear normal.     Nose: Nose normal.     Mouth/Throat:     Mouth: Mucous membranes are moist.     Pharynx: Oropharynx is clear.  Eyes:     Conjunctiva/sclera: Conjunctivae normal.  Cardiovascular:     Rate and Rhythm: Normal rate and regular rhythm.     Heart sounds: Normal heart sounds. No murmur heard.   Pulmonary:     Effort: Pulmonary effort is normal.     Breath sounds: Normal breath sounds. No wheezing, rhonchi or rales.  Musculoskeletal:     Cervical back: Neck supple.  Skin:    General: Skin is warm.     Findings: No rash.  Neurological:     Mental Status: She is alert and oriented to person, place, and time.  Psychiatric:        Behavior: Behavior normal.    Previous notes and tests were reviewed. The plan was reviewed with the patient/family, and all questions/concerned were addressed.  It was my pleasure to see Aristea today and participate in her care. Please feel free to contact me with any questions or concerns.  Sincerely,  Rexene Alberts, DO Allergy & Immunology  [facility] office: [phone] [facility] office: [place] office: [phone]

## 2020-08-14 ENCOUNTER — Other Ambulatory Visit: Payer: Self-pay

## 2020-08-14 ENCOUNTER — Ambulatory Visit (INDEPENDENT_AMBULATORY_CARE_PROVIDER_SITE_OTHER): Payer: Medicare Other | Admitting: Allergy

## 2020-08-14 ENCOUNTER — Encounter: Payer: Self-pay | Admitting: Allergy

## 2020-08-14 VITALS — BP 114/72 | HR 66 | Resp 16 | Ht 60.63 in | Wt 178.4 lb

## 2020-08-14 DIAGNOSIS — J3089 Other allergic rhinitis: Secondary | ICD-10-CM

## 2020-08-14 DIAGNOSIS — J309 Allergic rhinitis, unspecified: Secondary | ICD-10-CM

## 2020-08-14 DIAGNOSIS — J4540 Moderate persistent asthma, uncomplicated: Secondary | ICD-10-CM

## 2020-08-14 DIAGNOSIS — H1010 Acute atopic conjunctivitis, unspecified eye: Secondary | ICD-10-CM

## 2020-08-14 DIAGNOSIS — K219 Gastro-esophageal reflux disease without esophagitis: Secondary | ICD-10-CM | POA: Diagnosis not present

## 2020-08-14 DIAGNOSIS — J302 Other seasonal allergic rhinitis: Secondary | ICD-10-CM

## 2020-08-14 DIAGNOSIS — T7800XD Anaphylactic reaction due to unspecified food, subsequent encounter: Secondary | ICD-10-CM

## 2020-08-14 MED ORDER — OLOPATADINE HCL 0.2 % OP SOLN
1.0000 [drp] | Freq: Every day | OPHTHALMIC | 5 refills | Status: DC | PRN
Start: 2020-08-14 — End: 2021-03-12

## 2020-08-14 MED ORDER — FLUTICASONE PROPIONATE 50 MCG/ACT NA SUSP
2.0000 | Freq: Every day | NASAL | 5 refills | Status: DC
Start: 2020-08-14 — End: 2021-03-12

## 2020-08-14 MED ORDER — EPINEPHRINE 0.3 MG/0.3ML IJ SOAJ
INTRAMUSCULAR | 2 refills | Status: DC
Start: 2020-08-14 — End: 2021-08-08

## 2020-08-14 MED ORDER — OMEPRAZOLE 20 MG PO CPDR: 20.0000 mg | DELAYED_RELEASE_CAPSULE | Freq: Every morning | ORAL | 5 refills | Status: DC

## 2020-08-14 NOTE — Assessment & Plan Note (Addendum)
Stable.  Act score 17.  Today's spirometry was unremarkable. . Daily controller medication(s): Symbicort 143mcg 2 puffs twice a day with spacer and rinse mouth afterwards. . Prior to physical activity: May use albuterol rescue inhaler 2 puffs 5 to 15 minutes prior to strenuous physical activities. Marland Kitchen Rescue medications: May use albuterol rescue inhaler 2 puffs or nebulizer every 4 to 6 hours as needed for shortness of breath, chest tightness, coughing, and wheezing. Monitor frequency of use.  . During upper respiratory infections/asthma flares: start Flovent 14mcg 2 puffs twice a day with spacer and rinse mouth afterwards for 1-2 weeks until your breathing symptoms return to baseline.

## 2020-08-14 NOTE — Assessment & Plan Note (Signed)
Wants to try PPI.  Continue appropriate reflux lifestyle modifications.  Stop Famotidine.  Start omeprazole 20mg  one tablet in the morning. Nothing to eat or drink for 30 minutes afterwards. See if this new medication controls your reflux better.

## 2020-08-14 NOTE — Assessment & Plan Note (Signed)
No reactions since last visit. Food allergy  Continue careful avoidance of egg, peanuts, shellfish, bananas, and strawberries.  For mild symptoms you can take over the counter antihistamines such as Benadryl and monitor symptoms closely. If symptoms worsen or if you have severe symptoms including breathing issues, throat closure, significant swelling, whole body hives, severe diarrhea and vomiting, lightheadedness then inject epinephrine and seek immediate medical care afterwards.  Food allergy action plan is in place.

## 2020-08-14 NOTE — Assessment & Plan Note (Addendum)
Past history - on AIT (cat-tree-grass + mite-weed) Interim history - stable but using eye drops more often than prescribed.  Had eye exam in January 2021 per patient report.  Continue environmental control measures.  Continue allergy injections.  May use Flonase (fluticasone) nasal spray 2 sprays per nostril once a day as needed for nasal congestion.   May use olopatadine eye drops 0.2% ONCE a day as needed for itchy/watery eyes.  You may use over the counter refresh eye drops additionally if needed.   Nasal saline spray (i.e., Simply Saline) or nasal saline lavage (i.e., NeilMed) is recommended as needed and prior to medicated nasal sprays.  May use over the counter antihistamines such as Zyrtec (cetirizine), Claritin (loratadine), Allegra (fexofenadine), or Xyzal (levocetirizine) daily as needed.

## 2020-08-14 NOTE — Patient Instructions (Addendum)
Moderate persistent asthma . Breathing test not as good as last time but still within the normal range.  . Daily controller medication(s): Symbicort 141mcg 2 puffs twice a day with spacer and rinse mouth afterwards. . Prior to physical activity: May use albuterol rescue inhaler 2 puffs 5 to 15 minutes prior to strenuous physical activities. Marland Kitchen Rescue medications: May use albuterol rescue inhaler 2 puffs or nebulizer every 4 to 6 hours as needed for shortness of breath, chest tightness, coughing, and wheezing. Monitor frequency of use.  . During upper respiratory infections/asthma flares: start Flovent 193mcg 2 puffs twice a day with spacer and rinse mouth afterwards for 1-2 weeks until your breathing symptoms return to baseline.  . Asthma control goals:  o Full participation in all desired activities (may need albuterol before activity) o Albuterol use two times or less a week on average (not counting use with activity) o Cough interfering with sleep two times or less a month o Oral steroids no more than once a year o No hospitalizations  Seasonal and perennial allergic rhinitis  Continue environmental control measures.  Continue allergy injections.  May use Flonase (fluticasone) nasal spray 2 sprays per nostril once a day as needed for nasal congestion.   May use olopatadine eye drops 0.2% ONCE a day as needed for itchy/watery eyes.  You may use over the counter refresh eye drops additionally if needed.   Nasal saline spray (i.e., Simply Saline) or nasal saline lavage (i.e., NeilMed) is recommended as needed and prior to medicated nasal sprays.  May use over the counter antihistamines such as Zyrtec (cetirizine), Claritin (loratadine), Allegra (fexofenadine), or Xyzal (levocetirizine) daily as needed.  Food allergy  Continue careful avoidance of egg, peanuts, shellfish, bananas, and strawberries.  For mild symptoms you can take over the counter antihistamines such as Benadryl and  monitor symptoms closely. If symptoms worsen or if you have severe symptoms including breathing issues, throat closure, significant swelling, whole body hives, severe diarrhea and vomiting, lightheadedness then inject epinephrine and seek immediate medical care afterwards.  Food allergy action plan is in place.  GERD (gastroesophageal reflux disease)  Continue appropriate reflux lifestyle modifications.  Stop Famotidine.  Start omeprazole 20mg  one tablet in the morning. Nothing to eat or drink for 30 minutes afterwards. See if this new medication controls your reflux better.   Follow up in 6 months or sooner if needed.  Gastroesophageal Reflux Disease, Adult Gastroesophageal reflux (GER) happens when acid from the stomach flows up into the tube that connects the mouth and the stomach (esophagus). Normally, food travels down the esophagus and stays in the stomach to be digested. With GER, food and stomach acid sometimes move back up into the esophagus. You may have a disease called gastroesophageal reflux disease (GERD) if the reflux:  Happens often.  Causes frequent or very bad symptoms.  Causes problems such as damage to the esophagus. When this happens, the esophagus becomes sore and swollen (inflamed). Over time, GERD can make small holes (ulcers) in the lining of the esophagus. What are the causes? This condition is caused by a problem with the muscle between the esophagus and the stomach. When this muscle is weak or not normal, it does not close properly to keep food and acid from coming back up from the stomach. The muscle can be weak because of:  Tobacco use.  Pregnancy.  Having a certain type of hernia (hiatal hernia).  Alcohol use.  Certain foods and drinks, such as coffee, chocolate, onions,  and peppermint. What increases the risk? You are more likely to develop this condition if you:  Are overweight.  Have a disease that affects your connective tissue.  Use NSAID  medicines. What are the signs or symptoms? Symptoms of this condition include:  Heartburn.  Difficult or painful swallowing.  The feeling of having a lump in the throat.  A bitter taste in the mouth.  Bad breath.  Having a lot of saliva.  Having an upset or bloated stomach.  Belching.  Chest pain. Different conditions can cause chest pain. Make sure you see your doctor if you have chest pain.  Shortness of breath or noisy breathing (wheezing).  Ongoing (chronic) cough or a cough at night.  Wearing away of the surface of teeth (tooth enamel).  Weight loss. How is this treated? Treatment will depend on how bad your symptoms are. Your doctor may suggest:  Changes to your diet.  Medicine.  Surgery. Follow these instructions at home: Eating and drinking   Follow a diet as told by your doctor. You may need to avoid foods and drinks such as: ? Coffee and tea (with or without caffeine). ? Drinks that contain alcohol. ? Energy drinks and sports drinks. ? Bubbly (carbonated) drinks or sodas. ? Chocolate and cocoa. ? Peppermint and mint flavorings. ? Garlic and onions. ? Horseradish. ? Spicy and acidic foods. These include peppers, chili powder, curry powder, vinegar, hot sauces, and BBQ sauce. ? Citrus fruit juices and citrus fruits, such as oranges, lemons, and limes. ? Tomato-based foods. These include red sauce, chili, salsa, and pizza with red sauce. ? Fried and fatty foods. These include donuts, french fries, potato chips, and high-fat dressings. ? High-fat meats. These include hot dogs, rib eye steak, sausage, ham, and bacon. ? High-fat dairy items, such as whole milk, butter, and cream cheese.  Eat small meals often. Avoid eating large meals.  Avoid drinking large amounts of liquid with your meals.  Avoid eating meals during the 2-3 hours before bedtime.  Avoid lying down right after you eat.  Do not exercise right after you eat. Lifestyle   Do not  use any products that contain nicotine or tobacco. These include cigarettes, e-cigarettes, and chewing tobacco. If you need help quitting, ask your doctor.  Try to lower your stress. If you need help doing this, ask your doctor.  If you are overweight, lose an amount of weight that is healthy for you. Ask your doctor about a safe weight loss goal. General instructions  Pay attention to any changes in your symptoms.  Take over-the-counter and prescription medicines only as told by your doctor. Do not take aspirin, ibuprofen, or other NSAIDs unless your doctor says it is okay.  Wear loose clothes. Do not wear anything tight around your waist.  Raise (elevate) the head of your bed about 6 inches (15 cm).  Avoid bending over if this makes your symptoms worse.  Keep all follow-up visits as told by your doctor. This is important. Contact a doctor if:  You have new symptoms.  You lose weight and you do not know why.  You have trouble swallowing or it hurts to swallow.  You have wheezing or a cough that keeps happening.  Your symptoms do not get better with treatment.  You have a hoarse voice. Get help right away if:  You have pain in your arms, neck, jaw, teeth, or back.  You feel sweaty, dizzy, or light-headed.  You have chest pain or shortness  of breath.  You throw up (vomit) and your throw-up looks like blood or coffee grounds.  You pass out (faint).  Your poop (stool) is bloody or black.  You cannot swallow, drink, or eat. Summary  If a person has gastroesophageal reflux disease (GERD), food and stomach acid move back up into the esophagus and cause symptoms or problems such as damage to the esophagus.  Treatment will depend on how bad your symptoms are.  Follow a diet as told by your doctor.  Take all medicines only as told by your doctor. This information is not intended to replace advice given to you by your health care provider. Make sure you discuss any  questions you have with your health care provider. Document Revised: 06/16/2018 Document Reviewed: 06/16/2018 Elsevier Patient Education  [place].

## 2020-08-16 ENCOUNTER — Ambulatory Visit: Payer: Medicare Other | Admitting: Allergy and Immunology

## 2020-08-31 ENCOUNTER — Other Ambulatory Visit: Payer: Self-pay | Admitting: Allergy and Immunology

## 2020-09-04 ENCOUNTER — Ambulatory Visit (INDEPENDENT_AMBULATORY_CARE_PROVIDER_SITE_OTHER): Payer: Medicare Other | Admitting: Allergy

## 2020-09-04 ENCOUNTER — Other Ambulatory Visit: Payer: Self-pay

## 2020-09-04 ENCOUNTER — Encounter: Payer: Self-pay | Admitting: Allergy

## 2020-09-04 ENCOUNTER — Other Ambulatory Visit: Payer: Self-pay | Admitting: Dermatology

## 2020-09-04 VITALS — BP 132/80 | HR 74 | Temp 97.8°F | Resp 18 | Ht 60.0 in

## 2020-09-04 DIAGNOSIS — J069 Acute upper respiratory infection, unspecified: Secondary | ICD-10-CM

## 2020-09-04 DIAGNOSIS — T7800XD Anaphylactic reaction due to unspecified food, subsequent encounter: Secondary | ICD-10-CM | POA: Diagnosis not present

## 2020-09-04 DIAGNOSIS — J3089 Other allergic rhinitis: Secondary | ICD-10-CM

## 2020-09-04 DIAGNOSIS — J4540 Moderate persistent asthma, uncomplicated: Secondary | ICD-10-CM | POA: Diagnosis not present

## 2020-09-04 DIAGNOSIS — J309 Allergic rhinitis, unspecified: Secondary | ICD-10-CM | POA: Diagnosis not present

## 2020-09-04 DIAGNOSIS — K219 Gastro-esophageal reflux disease without esophagitis: Secondary | ICD-10-CM

## 2020-09-04 DIAGNOSIS — J302 Other seasonal allergic rhinitis: Secondary | ICD-10-CM | POA: Diagnosis not present

## 2020-09-04 DIAGNOSIS — H1010 Acute atopic conjunctivitis, unspecified eye: Secondary | ICD-10-CM

## 2020-09-04 MED ORDER — AZELASTINE HCL 0.15 % NA SOLN: NASAL | 5 refills | Status: DC

## 2020-09-04 NOTE — Assessment & Plan Note (Signed)
Stable. . Daily controller medication(s): Symbicort 148mcg 2 puffs twice a day with spacer and rinse mouth afterwards. . Prior to physical activity: May use albuterol rescue inhaler 2 puffs 5 to 15 minutes prior to strenuous physical activities. Marland Kitchen Rescue medications: May use albuterol rescue inhaler 2 puffs or nebulizer every 4 to 6 hours as needed for shortness of breath, chest tightness, coughing, and wheezing. Monitor frequency of use.  . During upper respiratory infections/asthma flares: start Flovent 172mcg 2 puffs twice a day with spacer and rinse mouth afterwards for 1-2 weeks until your breathing symptoms return to baseline.

## 2020-09-04 NOTE — Progress Notes (Signed)
Follow Up Note  RE: Gabrielle Shaw MRN: [id number] DOB: 01-Jun-1961 Date of Office Visit: 09/04/2020  Referring provider: [name], MD Primary care provider: [name], MD  Chief Complaint: Allergic Rhinitis , Sinus Problem (started 4 days ago), and Cough  History of Present Illness: I had the pleasure of seeing Gabrielle Shaw for a follow up visit at the [facility] [place] on 09/04/2020. She is a 59 y.o. female, who is being followed for asthma, allergic rhino conjunctivitis on AIT, food allergy and GERD. Her previous allergy office visit was on 08/14/2020 with Dr. [name]. Today is a new complaint visit of not feeling well.  Sinus Patient went to a funeral last Thursday and she had the fan on her on Saturday and woke up Sunday with sinus congestion with yellow drainage. No headaches.   Currently using Zyrtec 68m daily at night, Flonase 2 sprays per nostril twice a day.  Did not do saline rinses yet.   Patient is due for her allergy injections as well.   Denies sick contacts. No fevers/chills.  Up to date with COVID-19 vaccinations.   Moderate persistent asthma Slight coughing and doing Symbicort 1672m 2 puffs twice a day.  Patient did not have to use albuterol.   Food allergy No reactions since last visit.  GERD (gastroesophageal reflux disease) Taking omeprazole daily with good benefit.   Assessment and Plan: Gabrielle Shaw a [age].o. female with: Viral upper respiratory tract infection Patient was at a funeral last Thursday and on Sunday woke up with sinus congestion and yellow drainage denies any headaches, fevers/chills or sick contacts.  Patient is up-to-date COVID-19 vaccinations.  Using Zyrtec, Flonase with marginal benefit.  Patient most likely has a viral URI.   Use saline spray or nettipot before using your nasal sprays.   Start azelastine nasal spray 1-2 sprays per nostril twice a day as needed for runny nose/drainage.  Continue Flonase  (fluticasone) nasal spray 1 spray per nostril twice a day as for nasal congestion.   Take some Mucinex over the counter with plenty of water twice a day to help with the congestion.  May use over the counter antihistamines such as Zyrtec (cetirizine), Claritin (loratadine), Allegra (fexofenadine), or Xyzal (levocetirizine) daily as needed.  Get covid-19 testing and quarantine until results are back.  If your sinus symptoms worsen let [facility] and will send in antibiotics at that time.  No need for antibiotics or prednisone at this time.   Seasonal and perennial allergic rhinoconjunctivitis Past history - on AIT (cat-tree-grass + mite-weed) Interim history - sinuses flaring. Patient would like her allergy injections today.  Continue environmental control measures.  Continue allergy injections - red 0.43m76miven today.   May use Flonase (fluticasone) nasal spray 2 sprays per nostril once a day as needed for nasal congestion.   May use olopatadine eye drops 0.2% ONCE a day as needed for itchy/watery eyes.  You may use over the counter refresh eye drops additionally if needed.   Nasal saline spray (i.e., Simply Saline) or nasal saline lavage (i.e., NeilMed) is recommended as needed and prior to medicated nasal sprays.  May use over the counter antihistamines such as Zyrtec (cetirizine), Claritin (loratadine), Allegra (fexofenadine), or Xyzal (levocetirizine) daily as needed.  Moderate persistent asthma Stable. . Daily controller medication(s): Symbicort 160m79m puffs twice a day with spacer and rinse mouth afterwards. . Prior to physical activity: May use albuterol rescue inhaler 2 puffs 5 to 15 minutes prior  to strenuous physical activities. Marland Kitchen Rescue medications: May use albuterol rescue inhaler 2 puffs or nebulizer every 4 to 6 hours as needed for shortness of breath, chest tightness, coughing, and wheezing. Monitor frequency of use.  . During upper respiratory infections/asthma  flares: start Flovent 139mg 2 puffs twice a day with spacer and rinse mouth afterwards for 1-2 weeks until your breathing symptoms return to baseline.   Food allergy No reactions since last visit.  Continue careful avoidance of egg, peanuts, shellfish, bananas, and strawberries.  For mild symptoms you can take over the counter antihistamines such as Benadryl and monitor symptoms closely. If symptoms worsen or if you have severe symptoms including breathing issues, throat closure, significant swelling, whole body hives, severe diarrhea and vomiting, lightheadedness then inject epinephrine and seek immediate medical care afterwards.  Food allergy action plan is in place.  GERD (gastroesophageal reflux disease) Symptoms better with PPI.  Continue appropriate reflux lifestyle modifications.  Continue omeprazole 242mone tablet in the morning. Nothing to eat or drink for 30 minutes afterwards.  Return in about 6 months (around 03/04/2021).  Meds ordered this encounter  Medications  . Azelastine HCl 0.15 % SOLN    Sig: May use azelastine nasal spray 1-2 sprays per nostril twice a day as needed for runny nose/drainage.    Dispense:  30 mL    Refill:  5   Diagnostics: None.  Medication List:  Current Outpatient Medications  Medication Sig Dispense Refill  . albuterol (PROAIR HFA) 108 (90 Base) MCG/ACT inhaler Inhale 2 puffs into the lungs every 4 (four) hours as needed for wheezing or shortness of breath. 1 Inhaler 1  . albuterol (PROVENTIL) (2.5 MG/3ML) 0.083% nebulizer solution Take 3 mLs (2.5 mg total) by nebulization as needed. 75 mL 1  . aspirin EC 81 MG tablet Take 81 mg by mouth as needed.     . Blood Glucose Monitoring Suppl (ONETOUCH VERIO) w/Device KIT by Does not apply route.    . budesonide-formoterol (SYMBICORT) 160-4.5 MCG/ACT inhaler 2 puffs twice daily to prevent coughing or wheezing. Rinse gargle and spit. 1 Inhaler 5  . cetirizine (ZYRTEC) 10 MG tablet Take 10 mg by  mouth daily.    . Dapsone (ACZONE) 5 % topical gel Apply to face daily as needed.    . desonide (DESOWEN) 0.05 % ointment Apply to the scalp once daily as needed for itching.    . diphenhydrAMINE (BENADRYL) 50 MG tablet Take 50 mg by mouth every 6 (six) hours as needed for itching.    . donepezil (ARICEPT) 5 MG tablet Take by mouth.    . empagliflozin (JARDIANCE) 10 MG TABS tablet Take 1 tablet by mouth daily.    . Marland KitchenPINEPHrine (EPIPEN 2-PAK) 0.3 mg/0.3 mL IJ SOAJ injection USE AS DIRECTED FOR SEVERE ALLERGIC REACTION 1 each 2  . finasteride (PROSCAR) 5 MG tablet Take one tablet by mouth daily    . fluocinonide ointment (LIDEX) 0.05 % APPLY OINTMENT TO SCALP 2 TO 3 TIMES PER WEEK **DO NOT APPLY TO FACE**    . fluticasone (FLONASE) 50 MCG/ACT nasal spray Place 2 sprays into both nostrils daily. 16 g 5  . HYDROcodone-acetaminophen (NORCO/VICODIN) 5-325 MG tablet Take 1 tablet by mouth every 6 (six) hours as needed.    . hydrOXYzine (ATARAX/VISTARIL) 25 MG tablet ONE TABLET AT BEDTIME AS NEEDED FOR ITCHING. 30 tablet 3  . ibuprofen (ADVIL,MOTRIN) 800 MG tablet Take 1 tablet (800 mg total) by mouth every 8 (eight) hours as needed  for mild pain. 30 tablet 0  . ivermectin (STROMECTOL) 3 MG TABS tablet If exposed but no symptoms: Take 5 1/2 tablets per day 48 hours apart If experiencing symptoms: Take 5/12 tablets daily a minimum of 2 days, continue recovered (maximium 5 days) 30 tablet 0  . lisinopril-hydrochlorothiazide (PRINZIDE,ZESTORETIC) 10-12.5 MG tablet Take 0.5 tablets by mouth.     . meloxicam (MOBIC) 7.5 MG tablet Take by mouth.    . methocarbamol (ROBAXIN) 500 MG tablet Take 500 mg by mouth 3 (three) times daily.    . metroNIDAZOLE (METROGEL) 1 % gel Use as needed for rosacea    . minoxidil (LONITEN) 2.5 MG tablet SMARTSIG:.5 Tablet(s) By Mouth Daily    . NON FORMULARY     . Olopatadine HCl 0.2 % SOLN Apply 1 drop to eye daily as needed (itchy/watery eyes). INSTILL ONE DROP INTO EACH EYE ONCE  DAILY AS NEEDED FOR ITCHY EYES 2.5 mL 5  . omeprazole (PRILOSEC) 20 MG capsule Take 1 capsule (20 mg total) by mouth in the morning. 30 capsule 5  . potassium chloride (K-DUR,KLOR-CON) 10 MEQ tablet Take by mouth.    . potassium chloride (KLOR-CON) 10 MEQ tablet Take 10 mEq by mouth 2 (two) times daily.    . pregabalin (LYRICA) 100 MG capsule Take by mouth.    . traMADol (ULTRAM) 50 MG tablet TAKE 1 TABLET BY MOUTH EVERY 6 HOURS AS NEEDED FOR PAIN FOR UP TO 5 DAYS    . triamcinolone acetonide (KENALOG-40) 40 MG/ML injection (RADIOLOGY ONLY) Inject into the articular space.    . Azelastine HCl 0.15 % SOLN May use azelastine nasal spray 1-2 sprays per nostril twice a day as needed for runny nose/drainage. 30 mL 5  . lisinopril-hydrochlorothiazide (ZESTORETIC) 10-12.5 MG tablet Take by mouth.     No current facility-administered medications for this visit.   Allergies: Allergies  Allergen Reactions  . Iodinated Diagnostic Agents Anaphylaxis    Cardiac Arrest Seizure  . Other Hives  . Shellfish Allergy Hives  . Sulfasalazine Hives  . Tetrofosmin [Technetium-9m Hives and Anaphylaxis  . Banana Hives  . Eggs Or Egg-Derived Products Nausea And Vomiting  . Strawberry Extract Hives  . Sulfamethoxazole Rash  . Biaxin [Clarithromycin] Hives  . Latex Hives  . Peanuts [Peanut Oil] Hives  . Pravastatin Hives  . Shellfish-Derived Products Hives  . Sulfa Antibiotics Hives and Rash   I reviewed her past medical history, social history, family history, and environmental history and no significant changes have been reported from her previous visit.  Review of Systems  Constitutional: Negative for appetite change, chills, fever and unexpected weight change.  HENT: Positive for congestion, postnasal drip and rhinorrhea.   Eyes: Negative for itching.  Respiratory: Positive for cough. Negative for chest tightness, shortness of breath and wheezing.   Gastrointestinal: Negative for abdominal pain.    Skin: Negative for rash.  Allergic/Immunologic: Positive for environmental allergies and food allergies.  Neurological: Negative for headaches.   Objective: BP 132/80   Pulse 74   Temp 97.8 F (36.6 C) (Temporal)   Resp 18   Ht 5' (1.524 m)   SpO2 97%   BMI 34.84 kg/m  Body mass index is 34.84 kg/m. Physical Exam Vitals and nursing note reviewed.  Constitutional:      Appearance: Normal appearance. She is well-developed.  HENT:     Head: Normocephalic and atraumatic.     Right Ear: Tympanic membrane and external ear normal.     Left Ear:  Tympanic membrane and external ear normal.     Nose: Nose normal.     Mouth/Throat:     Mouth: Mucous membranes are moist.     Pharynx: Oropharynx is clear.  Eyes:     Conjunctiva/sclera: Conjunctivae normal.  Cardiovascular:     Rate and Rhythm: Normal rate and regular rhythm.     Heart sounds: Normal heart sounds. No murmur heard.   Pulmonary:     Effort: Pulmonary effort is normal.     Breath sounds: Normal breath sounds. No wheezing, rhonchi or rales.  Musculoskeletal:     Cervical back: Neck supple.  Skin:    General: Skin is warm.     Findings: No rash.  Neurological:     Mental Status: She is alert and oriented to person, place, and time.  Psychiatric:        Behavior: Behavior normal.    Previous notes and tests were reviewed. The plan was reviewed with the patient/family, and all questions/concerned were addressed.  It was my pleasure to see Gabrielle Shaw today and participate in her care. Please feel free to contact me with any questions or concerns.  Sincerely,  Rexene Alberts, DO Allergy & Immunology  [facility] office: [phone] [facility] office: [place] office: [phone]

## 2020-09-04 NOTE — Assessment & Plan Note (Signed)
Symptoms better with PPI.  Continue appropriate reflux lifestyle modifications.  Continue omeprazole 20mg  one tablet in the morning. Nothing to eat or drink for 30 minutes afterwards.

## 2020-09-04 NOTE — Assessment & Plan Note (Signed)
Past history - on AIT (cat-tree-grass + mite-weed) Interim history - sinuses flaring. Patient would like her allergy injections today.  Continue environmental control measures.  Continue allergy injections - red 0.57mL given today.   May use Flonase (fluticasone) nasal spray 2 sprays per nostril once a day as needed for nasal congestion.   May use olopatadine eye drops 0.2% ONCE a day as needed for itchy/watery eyes.  You may use over the counter refresh eye drops additionally if needed.   Nasal saline spray (i.e., Simply Saline) or nasal saline lavage (i.e., NeilMed) is recommended as needed and prior to medicated nasal sprays.  May use over the counter antihistamines such as Zyrtec (cetirizine), Claritin (loratadine), Allegra (fexofenadine), or Xyzal (levocetirizine) daily as needed.

## 2020-09-04 NOTE — Assessment & Plan Note (Signed)
No reactions since last visit.  Continue careful avoidance of egg, peanuts, shellfish, bananas, and strawberries.  For mild symptoms you can take over the counter antihistamines such as Benadryl and monitor symptoms closely. If symptoms worsen or if you have severe symptoms including breathing issues, throat closure, significant swelling, whole body hives, severe diarrhea and vomiting, lightheadedness then inject epinephrine and seek immediate medical care afterwards.  Food allergy action plan is in place.

## 2020-09-04 NOTE — Patient Instructions (Addendum)
Sinus flares  Use saline spray or nettipot before using your nasal sprays.   Start azelastine nasal spray 1-2 sprays per nostril twice a day as needed for runny nose/drainage.  Continue Flonase (fluticasone) nasal spray 1 spray per nostril twice a day as for nasal congestion.   Take some Mucinex over the counter with plenty of water twice a day to help with the congestion.  May use over the counter antihistamines such as Zyrtec (cetirizine), Claritin (loratadine), Allegra (fexofenadine), or Xyzal (levocetirizine) daily as needed.  Get covid-19 testing and quarantine until results are back.  If your sinus symptoms worsen let us know.  No need for antibiotics or prednisone at this time.   Moderate persistent asthma . Daily controller medication(s): Symbicort 151mcg 2 puffs twice a day with spacer and rinse mouth afterwards. . Prior to physical activity: May use albuterol rescue inhaler 2 puffs 5 to 15 minutes prior to strenuous physical activities. Marland Kitchen Rescue medications: May use albuterol rescue inhaler 2 puffs or nebulizer every 4 to 6 hours as needed for shortness of breath, chest tightness, coughing, and wheezing. Monitor frequency of use.  . During upper respiratory infections/asthma flares: start Flovent 142mcg 2 puffs twice a day with spacer and rinse mouth afterwards for 1-2 weeks until your breathing symptoms return to baseline.  . Asthma control goals:  o Full participation in all desired activities (may need albuterol before activity) o Albuterol use two times or less a week on average (not counting use with activity) o Cough interfering with sleep two times or less a month o Oral steroids no more than once a year o No hospitalizations  Seasonal and perennial allergic rhinitis  Continue environmental control measures.  Continue allergy injections.  May use Flonase (fluticasone) nasal spray 2 sprays per nostril once a day as needed for nasal congestion.   May use  olopatadine eye drops 0.2% ONCE a day as needed for itchy/watery eyes.  You may use over the counter refresh eye drops additionally if needed.   Nasal saline spray (i.e., Simply Saline) or nasal saline lavage (i.e., NeilMed) is recommended as needed and prior to medicated nasal sprays.  May use over the counter antihistamines such as Zyrtec (cetirizine), Claritin (loratadine), Allegra (fexofenadine), or Xyzal (levocetirizine) daily as needed.  Food allergy  Continue careful avoidance of egg, peanuts, shellfish, bananas, and strawberries.  For mild symptoms you can take over the counter antihistamines such as Benadryl and monitor symptoms closely. If symptoms worsen or if you have severe symptoms including breathing issues, throat closure, significant swelling, whole body hives, severe diarrhea and vomiting, lightheadedness then inject epinephrine and seek immediate medical care afterwards.  Food allergy action plan is in place.  GERD (gastroesophageal reflux disease)  Continue appropriate reflux lifestyle modifications.  Continue omeprazole 20mg  one tablet in the morning. Nothing to eat or drink for 30 minutes afterwards.   Follow up in 6 months or sooner if needed.

## 2020-09-04 NOTE — Assessment & Plan Note (Signed)
Patient was at a funeral last Thursday and on Sunday woke up with sinus congestion and yellow drainage denies any headaches, fevers/chills or sick contacts.  Patient is up-to-date COVID-19 vaccinations.  Using Zyrtec, Flonase with marginal benefit.  Patient most likely has a viral URI.   Use saline spray or nettipot before using your nasal sprays.   Start azelastine nasal spray 1-2 sprays per nostril twice a day as needed for runny nose/drainage.  Continue Flonase (fluticasone) nasal spray 1 spray per nostril twice a day as for nasal congestion.   Take some Mucinex over the counter with plenty of water twice a day to help with the congestion.  May use over the counter antihistamines such as Zyrtec (cetirizine), Claritin (loratadine), Allegra (fexofenadine), or Xyzal (levocetirizine) daily as needed.  Get covid-19 testing and quarantine until results are back.  If your sinus symptoms worsen let us know and will send in antibiotics at that time.  No need for antibiotics or prednisone at this time.

## 2020-09-05 ENCOUNTER — Telehealth: Payer: Self-pay | Admitting: Allergy

## 2020-09-05 NOTE — Telephone Encounter (Signed)
Just FYI.

## 2020-09-05 NOTE — Telephone Encounter (Signed)
Gabrielle Shaw went to have a Covid test yesterday, 09/04/20. She called this morning to say the results were negative.

## 2020-09-05 NOTE — Telephone Encounter (Signed)
Noted  

## 2020-09-25 ENCOUNTER — Other Ambulatory Visit: Payer: Self-pay

## 2020-09-25 ENCOUNTER — Ambulatory Visit (INDEPENDENT_AMBULATORY_CARE_PROVIDER_SITE_OTHER): Payer: Medicare Other

## 2020-09-25 DIAGNOSIS — J309 Allergic rhinitis, unspecified: Secondary | ICD-10-CM | POA: Diagnosis not present

## 2020-10-16 ENCOUNTER — Telehealth: Payer: Self-pay

## 2020-10-16 ENCOUNTER — Ambulatory Visit (INDEPENDENT_AMBULATORY_CARE_PROVIDER_SITE_OTHER): Payer: Medicare Other

## 2020-10-16 ENCOUNTER — Other Ambulatory Visit: Payer: Self-pay

## 2020-10-16 DIAGNOSIS — J309 Allergic rhinitis, unspecified: Secondary | ICD-10-CM | POA: Diagnosis not present

## 2020-10-16 NOTE — Telephone Encounter (Signed)
Patient came in today to get her allergy injections. Patient is wanting to get the egg free flu vaccine as a graded challenge with Dr. Edison Nasuti in [facility]. Patient was offered to have it done in the [facility] office with Dr. [name] however patient stated that since she has only been seen by Dr. [name] twice she would prefer to come to [place] and be seen by  Dr. Edison Nasuti for this challenge since he knows her better. Please call her to schedule.

## 2020-10-16 NOTE — Telephone Encounter (Signed)
Sure, that's fine, Ashly. Put her on the [place] schedule. If she can get in sooner on Anne or Chrissy's schedule in [place] let her know that I'll still over see the challenge.  Thanks.

## 2020-10-17 NOTE — Telephone Encounter (Signed)
Pt is scheduled for [facility] nov 30th at 830 am for the flu open graded challenge

## 2020-11-08 ENCOUNTER — Other Ambulatory Visit: Payer: Self-pay

## 2020-11-08 ENCOUNTER — Ambulatory Visit (INDEPENDENT_AMBULATORY_CARE_PROVIDER_SITE_OTHER): Payer: Medicare Other

## 2020-11-08 DIAGNOSIS — J309 Allergic rhinitis, unspecified: Secondary | ICD-10-CM | POA: Diagnosis not present

## 2020-11-19 DIAGNOSIS — J301 Allergic rhinitis due to pollen: Secondary | ICD-10-CM

## 2020-11-19 NOTE — Progress Notes (Signed)
Vials exp 11-19-21

## 2020-11-20 ENCOUNTER — Ambulatory Visit (INDEPENDENT_AMBULATORY_CARE_PROVIDER_SITE_OTHER): Payer: Medicare Other | Admitting: Allergy

## 2020-11-20 ENCOUNTER — Other Ambulatory Visit: Payer: Self-pay

## 2020-11-20 ENCOUNTER — Encounter: Payer: Self-pay | Admitting: Allergy

## 2020-11-20 VITALS — BP 124/70 | HR 83 | Resp 17

## 2020-11-20 DIAGNOSIS — H1010 Acute atopic conjunctivitis, unspecified eye: Secondary | ICD-10-CM

## 2020-11-20 DIAGNOSIS — J3089 Other allergic rhinitis: Secondary | ICD-10-CM

## 2020-11-20 DIAGNOSIS — K219 Gastro-esophageal reflux disease without esophagitis: Secondary | ICD-10-CM | POA: Diagnosis not present

## 2020-11-20 DIAGNOSIS — Z23 Encounter for immunization: Secondary | ICD-10-CM

## 2020-11-20 DIAGNOSIS — J302 Other seasonal allergic rhinitis: Secondary | ICD-10-CM

## 2020-11-20 DIAGNOSIS — T7800XD Anaphylactic reaction due to unspecified food, subsequent encounter: Secondary | ICD-10-CM

## 2020-11-20 DIAGNOSIS — J4540 Moderate persistent asthma, uncomplicated: Secondary | ICD-10-CM

## 2020-11-20 MED ORDER — LEVOCETIRIZINE DIHYDROCHLORIDE 5 MG PO TABS: 5.0000 mg | ORAL_TABLET | Freq: Every day | ORAL | 3 refills | Status: DC | PRN

## 2020-11-20 NOTE — Assessment & Plan Note (Signed)
Stable. . Daily controller medication(s): Symbicort 159mcg 2 puffs twice a day with spacer and rinse mouth afterwards. . Prior to physical activity: May use albuterol rescue inhaler 2 puffs 5 to 15 minutes prior to strenuous physical activities. Marland Kitchen Rescue medications: May use albuterol rescue inhaler 2 puffs or nebulizer every 4 to 6 hours as needed for shortness of breath, chest tightness, coughing, and wheezing. Monitor frequency of use.  . During upper respiratory infections/asthma flares: start Flovent 146mcg 2 puffs twice a day with spacer and rinse mouth afterwards for 1-2 weeks until your breathing symptoms return to baseline. . Get spirometry at next visit.

## 2020-11-20 NOTE — Assessment & Plan Note (Signed)
   Continue careful avoidance of egg, peanuts, shellfish, bananas, and strawberries.  For mild symptoms you can take over the counter antihistamines such as Benadryl and monitor symptoms closely. If symptoms worsen or if you have severe symptoms including breathing issues, throat closure, significant swelling, whole body hives, severe diarrhea and vomiting, lightheadedness then inject epinephrine and seek immediate medical care afterwards.  Food allergy action plan is in place.

## 2020-11-20 NOTE — Assessment & Plan Note (Signed)
Controlled with PPI.  Continue appropriate reflux lifestyle modifications.  Continue omeprazole 20mg one tablet in the morning. Nothing to eat or drink for 30 minutes afterwards. 

## 2020-11-20 NOTE — Assessment & Plan Note (Signed)
Past history - on AIT (cat-tree-grass + mite-weed) Interim history - stable.  Continue environmental control measures.  Continue allergy injections.  May use Flonase (fluticasone) nasal spray 2 sprays per nostril once a day as needed for nasal congestion.   May use olopatadine eye drops 0.2% once a day as needed for itchy/watery eyes.  You may use over the counter refresh eye drops additionally if needed.   Nasal saline spray (i.e., Simply Saline) or nasal saline lavage (i.e., NeilMed) is recommended as needed and prior to medicated nasal sprays.  May use over the counter antihistamines such as Zyrtec (cetirizine), Claritin (loratadine), Allegra (fexofenadine), or Xyzal (levocetirizine) daily as needed.

## 2020-11-20 NOTE — Assessment & Plan Note (Signed)
Reaction to regular influenza vaccine a few years ago in the form of vomiting/nausea requiring epinephrine treatment. Last year tolerated egg free flu vaccine in 2 graded dosing with no issues.   Discussed with patient that there is no need to do graded dosing challenge anymore as she tolerated it last year.  Patient received 1 dose of 0.78mL of egg free flu vaccine (flucelvax) today with 30 minute wait. Patient tolerated with no issues.  Patient prefers to get annual egg free flu vaccine in our clinic - may receive in 1 dose with 30 minute wait period.

## 2020-11-20 NOTE — Patient Instructions (Addendum)
Egg free flu vaccine given today.   For next 24 hours monitor for hives, swelling, shortness of breath and dizziness. If you see these symptoms, use Benadryl for mild symptoms and epinephrine for more severe symptoms and call 911.  If you feel more comfortable - then it's okay for you to get annual egg free flu vaccine in our office every year with 1 dose and 30 minute wait. No need to do a graded dose challenge.  Moderate persistent asthma . Daily controller medication(s): Symbicort 144mcg 2 puffs twice a day with spacer and rinse mouth afterwards. . Prior to physical activity: May use albuterol rescue inhaler 2 puffs 5 to 15 minutes prior to strenuous physical activities. Marland Kitchen Rescue medications: May use albuterol rescue inhaler 2 puffs or nebulizer every 4 to 6 hours as needed for shortness of breath, chest tightness, coughing, and wheezing. Monitor frequency of use.  . During upper respiratory infections/asthma flares: start Flovent 161mcg 2 puffs twice a day with spacer and rinse mouth afterwards for 1-2 weeks until your breathing symptoms return to baseline.  . Asthma control goals:  o Full participation in all desired activities (may need albuterol before activity) o Albuterol use two times or less a week on average (not counting use with activity) o Cough interfering with sleep two times or less a month o Oral steroids no more than once a year o No hospitalizations  Seasonal and perennial allergic rhinitis  Continue environmental control measures.  Continue allergy injections.  May use Flonase (fluticasone) nasal spray 2 sprays per nostril once a day as needed for nasal congestion.   May use olopatadine eye drops 0.2% once a day as needed for itchy/watery eyes.  You may use over the counter refresh eye drops additionally if needed.   Nasal saline spray (i.e., Simply Saline) or nasal saline lavage (i.e., NeilMed) is recommended as needed and prior to medicated nasal sprays.  May  use over the counter antihistamines such as Zyrtec (cetirizine), Claritin (loratadine), Allegra (fexofenadine), or Xyzal (levocetirizine) daily as needed.  Food allergy  Continue careful avoidance of egg, peanuts, shellfish, bananas, and strawberries.  For mild symptoms you can take over the counter antihistamines such as Benadryl and monitor symptoms closely. If symptoms worsen or if you have severe symptoms including breathing issues, throat closure, significant swelling, whole body hives, severe diarrhea and vomiting, lightheadedness then inject epinephrine and seek immediate medical care afterwards.  Food allergy action plan is in place.  GERD (gastroesophageal reflux disease)  Continue appropriate reflux lifestyle modifications.  Continue omeprazole 20mg  one tablet in the morning. Nothing to eat or drink for 30 minutes afterwards.   Follow up in March as scheduled.

## 2020-11-20 NOTE — Progress Notes (Signed)
Follow Up Note  RE: Gabrielle Shaw MRN: [id number] DOB: [birth year] Date of Office Visit: 11/20/2020  Referring provider: [name], MD Primary care provider: [name], MD  Chief Complaint: Food/Drug Challenge (Flu Vaccine, Egg Free)  Assessment and Plan: Gabrielle Shaw is a 59 y.o. female with: Encounter for immunization Reaction to regular influenza vaccine a few years ago in the form of vomiting/nausea requiring epinephrine treatment. Last year tolerated egg free flu vaccine in 2 graded dosing with no issues.   Discussed with patient that there is no need to do graded dosing challenge anymore as she tolerated it last year.  Patient received 1 dose of 0.43m of egg free flu vaccine (flucelvax) today with 30 minute wait. Patient tolerated with no issues.  Patient prefers to get annual egg free flu vaccine in our clinic - may receive in 1 dose with 30 minute wait period.   Moderate persistent asthma without complication Stable. . Daily controller medication(s): Symbicort 1631m 2 puffs twice a day with spacer and rinse mouth afterwards. . Prior to physical activity: May use albuterol rescue inhaler 2 puffs 5 to 15 minutes prior to strenuous physical activities. . Marland Kitchenescue medications: May use albuterol rescue inhaler 2 puffs or nebulizer every 4 to 6 hours as needed for shortness of breath, chest tightness, coughing, and wheezing. Monitor frequency of use.  . During upper respiratory infections/asthma flares: start Flovent 11011m2 puffs twice a day with spacer and rinse mouth afterwards for 1-2 weeks until your breathing symptoms return to baseline. . Get spirometry at next visit.   Seasonal and perennial allergic rhinoconjunctivitis Past history - on AIT (cat-tree-grass + mite-weed) Interim history - stable.  Continue environmental control measures.  Continue allergy injections.  May use Flonase (fluticasone) nasal spray 2 sprays per nostril once a day as needed for nasal  congestion.   May use olopatadine eye drops 0.2% once a day as needed for itchy/watery eyes.  You may use over the counter refresh eye drops additionally if needed.   Nasal saline spray (i.e., Simply Saline) or nasal saline lavage (i.e., NeilMed) is recommended as needed and prior to medicated nasal sprays.  May use over the counter antihistamines such as Zyrtec (cetirizine), Claritin (loratadine), Allegra (fexofenadine), or Xyzal (levocetirizine) daily as needed.  Food allergy  Continue careful avoidance of egg, peanuts, shellfish, bananas, and strawberries.  For mild symptoms you can take over the counter antihistamines such as Benadryl and monitor symptoms closely. If symptoms worsen or if you have severe symptoms including breathing issues, throat closure, significant swelling, whole body hives, severe diarrhea and vomiting, lightheadedness then inject epinephrine and seek immediate medical care afterwards.  Food allergy action plan is in place.  Gastroesophageal reflux disease Controlled with PPI.  Continue appropriate reflux lifestyle modifications.  Continue omeprazole 27m24me tablet in the morning. Nothing to eat or drink for 30 minutes afterwards.  Return in about 4 months (around 03/20/2021).  Plan: Challenge drug: egg free flu vaccine Challenge as per protocol: Passed Total time: 30 minutes  History of Present Illness: I had the pleasure of [facility] She is a 59 y46. female, who is being followed for allergic rhinoconjunctivitis on AIT asthma, food allergy and GERD. Her previous allergy office visit was on 09/04/2020 with Dr. Kiauna Zywicki.Maudie Mercuryday she is here for egg free flu vaccine challenge.   History of Reaction: About 3 years ago patient had a  reaction to the regular influenza vaccine at her PCP's office visit. Patient developed nausea and vomiting within 10-15 minutes. She was treated with  epinephrine which helped. Last year patient had graded dosing of the egg free flu vaccine with no reactions. Currently avoiding eggs, peanuts, shellfish, bananas and strawberries.   She wants to get flu shot today but hesitant about potential reactions.   Requesting Xyzal refills to use for itching.  Seasonal and perennial allergic rhinoconjunctivitis Currently on AIT with good benefit.  Moderate persistent asthma Stable and doing well.  Denies any SOB, coughing, wheezing, chest tightness, nocturnal awakenings, ER/urgent care visits or prednisone use since the last visit.  GERD (gastroesophageal reflux disease) Controlled with PPI.  Interval History: Patient has not been ill, she has not had any accidental exposures to the culprit medication.   Recent/Current History: Pulmonary disease: yes - asthma  Cardiac disease: no Respiratory infection: no Rash: no Itch: some itching Swelling: no Cough: no Shortness of breath: no Runny/stuffy nose: no Itchy eyes: no Beta-blocker use: no  Patient/guardian was informed of the test procedure with verbalized understanding of the risk of anaphylaxis. Consent was signed.   Last antihistamine use: last night at 6PM Last beta-blocker use: n/a  Medication List:  Current Outpatient Medications  Medication Sig Dispense Refill  . albuterol (PROAIR HFA) 108 (90 Base) MCG/ACT inhaler Inhale 2 puffs into the lungs every 4 (four) hours as needed for wheezing or shortness of breath. 1 Inhaler 1  . albuterol (PROVENTIL) (2.5 MG/3ML) 0.083% nebulizer solution Take 3 mLs (2.5 mg total) by nebulization as needed. 75 mL 1  . Azelastine HCl 0.15 % SOLN May use azelastine nasal spray 1-2 sprays per nostril twice a day as needed for runny nose/drainage. 30 mL 5  . Blood Glucose Monitoring Suppl (ONETOUCH VERIO) w/Device KIT by Does not apply route.    . budesonide-formoterol (SYMBICORT) 160-4.5 MCG/ACT inhaler 2 puffs twice daily to prevent coughing or  wheezing. Rinse gargle and spit. 1 Inhaler 5  . cetirizine (ZYRTEC) 10 MG tablet Take 10 mg by mouth daily.    . Dapsone (ACZONE) 5 % topical gel Apply to face daily as needed.    . desonide (DESOWEN) 0.05 % ointment Apply to the scalp once daily as needed for itching.    . diphenhydrAMINE (BENADRYL) 50 MG tablet Take 50 mg by mouth every 6 (six) hours as needed for itching.    . donepezil (ARICEPT) 5 MG tablet Take by mouth.    . empagliflozin (JARDIANCE) 10 MG TABS tablet Take 1 tablet by mouth daily.    Marland Kitchen EPINEPHrine (EPIPEN 2-PAK) 0.3 mg/0.3 mL IJ SOAJ injection USE AS DIRECTED FOR SEVERE ALLERGIC REACTION 1 each 2  . finasteride (PROSCAR) 5 MG tablet Take one tablet by mouth daily    . fluocinonide ointment (LIDEX) 0.05 % APPLY OINTMENT TO SCALP 2 TO 3 TIMES PER WEEK **DO NOT APPLY TO FACE**    . fluticasone (FLONASE) 50 MCG/ACT nasal spray Place 2 sprays into both nostrils daily. 16 g 5  . folic acid (FOLVITE) 1 MG tablet Take 1 mg by mouth daily.    Marland Kitchen HYDROcodone-acetaminophen (NORCO/VICODIN) 5-325 MG tablet Take 1 tablet by mouth every 6 (six) hours as needed.    . hydrOXYzine (ATARAX/VISTARIL) 25 MG tablet ONE TABLET AT BEDTIME AS NEEDED FOR ITCHING. 30 tablet 3  . ibuprofen (ADVIL,MOTRIN) 800 MG tablet Take 1 tablet (800 mg total) by mouth every 8 (eight) hours as needed for mild pain. [place]  tablet 0  . ivermectin (STROMECTOL) 3 MG TABS tablet If exposed but no symptoms: Take 5 1/2 tablets per day 48 hours apart If experiencing symptoms: Take 5/12 tablets daily a minimum of 2 days, continue recovered (maximium 5 days) 30 tablet 0  . lisinopril-hydrochlorothiazide (PRINZIDE,ZESTORETIC) 10-12.5 MG tablet Take 0.5 tablets by mouth.     . meloxicam (MOBIC) 7.5 MG tablet Take by mouth.    . methocarbamol (ROBAXIN) 500 MG tablet Take 500 mg by mouth 3 (three) times daily.    . methotrexate 2.5 MG tablet     . metroNIDAZOLE (METROGEL) 1 % gel Use as needed for rosacea    . minoxidil (LONITEN)  2.5 MG tablet SMARTSIG:.5 Tablet(s) By Mouth Daily    . NON FORMULARY     . Olopatadine HCl 0.2 % SOLN Apply 1 drop to eye daily as needed (itchy/watery eyes). INSTILL ONE DROP INTO EACH EYE ONCE DAILY AS NEEDED FOR ITCHY EYES 2.5 mL 5  . omeprazole (PRILOSEC) 20 MG capsule Take 1 capsule (20 mg total) by mouth in the morning. 30 capsule 5  . potassium chloride (KLOR-CON) 10 MEQ tablet Take 10 mEq by mouth 2 (two) times daily.    . pregabalin (LYRICA) 100 MG capsule Take by mouth.    . traMADol (ULTRAM) 50 MG tablet TAKE 1 TABLET BY MOUTH EVERY 6 HOURS AS NEEDED FOR PAIN FOR UP TO 5 DAYS    . triamcinolone acetonide (KENALOG-40) 40 MG/ML injection (RADIOLOGY ONLY) Inject into the articular space.    Marland Kitchen aspirin EC 81 MG tablet Take 81 mg by mouth as needed.     Marland Kitchen levocetirizine (XYZAL) 5 MG tablet Take 1 tablet (5 mg total) by mouth daily as needed for allergies. 30 tablet 3  . lisinopril-hydrochlorothiazide (ZESTORETIC) 10-12.5 MG tablet Take by mouth.     No current facility-administered medications for this visit.   Allergies: Allergies  Allergen Reactions  . Iodinated Diagnostic Agents Anaphylaxis    Cardiac Arrest Seizure  . Other Hives  . Shellfish Allergy Hives  . Sulfasalazine Hives  . Tetrofosmin [Technetium-73m Hives and Anaphylaxis  . Banana Hives  . Cat Hair Extract Other (See Comments)    Hives   . Eggs Or Egg-Derived Products Nausea And Vomiting  . Hemophilus B Polysaccharide Vaccine Other (See Comments)    OTHER  OTHER    . Strawberry Extract Hives  . Sulfamethoxazole Rash  . Biaxin [Clarithromycin] Hives  . Latex Hives  . Peanuts [Peanut Oil] Hives  . Pravastatin Hives  . Shellfish-Derived Products Hives  . Sulfa Antibiotics Hives and Rash   I reviewed her past medical history, social history, family history, and environmental history and no significant changes have been reported from her previous visit.  Review of Systems  Constitutional: Negative for  appetite change, chills, fever and unexpected weight change.  HENT: Negative for congestion, postnasal drip and rhinorrhea.   Eyes: Negative for itching.  Respiratory: Negative for cough, chest tightness, shortness of breath and wheezing.   Gastrointestinal: Negative for abdominal pain.  Skin: Negative for rash.  Allergic/Immunologic: Positive for environmental allergies and food allergies.  Neurological: Negative for headaches.   Objective: BP 124/70   Pulse 83   Resp 17   SpO2 98%  There is no height or weight on file to calculate BMI. Physical Exam Vitals and nursing note reviewed.  Constitutional:      Appearance: Normal appearance. She is well-developed.  HENT:     Head: Normocephalic and atraumatic.  Right Ear: Tympanic membrane and external ear normal.     Left Ear: Tympanic membrane and external ear normal.     Nose: Nose normal.     Mouth/Throat:     Mouth: Mucous membranes are moist.     Pharynx: Oropharynx is clear.  Eyes:     Conjunctiva/sclera: Conjunctivae normal.  Cardiovascular:     Rate and Rhythm: Normal rate and regular rhythm.     Heart sounds: Normal heart sounds. No murmur heard.   Pulmonary:     Effort: Pulmonary effort is normal.     Breath sounds: Normal breath sounds. No wheezing, rhonchi or rales.  Musculoskeletal:     Cervical back: Neck supple.  Skin:    General: Skin is warm.     Findings: No rash.  Neurological:     Mental Status: She is alert and oriented to person, place, and time.  Psychiatric:        Behavior: Behavior normal.    Diagnostics: None.  Previous notes and tests were reviewed. The plan was reviewed with the patient/family, and all questions/concerned were addressed.  It was my pleasure to see Gabrielle Shaw today and participate in her care. Please feel free to contact me with any questions or concerns.  Sincerely,  Rexene Alberts, DO Allergy & Immunology  [facility] office:  [place] office: [phone]

## 2020-11-21 DIAGNOSIS — J3089 Other allergic rhinitis: Secondary | ICD-10-CM

## 2020-12-06 ENCOUNTER — Ambulatory Visit (INDEPENDENT_AMBULATORY_CARE_PROVIDER_SITE_OTHER): Payer: Medicare Other

## 2020-12-06 ENCOUNTER — Other Ambulatory Visit: Payer: Self-pay

## 2020-12-06 DIAGNOSIS — J309 Allergic rhinitis, unspecified: Secondary | ICD-10-CM

## 2021-01-01 ENCOUNTER — Ambulatory Visit (INDEPENDENT_AMBULATORY_CARE_PROVIDER_SITE_OTHER): Payer: Medicare Other

## 2021-01-01 ENCOUNTER — Other Ambulatory Visit: Payer: Self-pay

## 2021-01-01 DIAGNOSIS — J309 Allergic rhinitis, unspecified: Secondary | ICD-10-CM | POA: Diagnosis not present

## 2021-02-07 ENCOUNTER — Other Ambulatory Visit: Payer: Self-pay

## 2021-02-07 ENCOUNTER — Ambulatory Visit (INDEPENDENT_AMBULATORY_CARE_PROVIDER_SITE_OTHER): Payer: Medicare Other

## 2021-02-07 DIAGNOSIS — J309 Allergic rhinitis, unspecified: Secondary | ICD-10-CM

## 2021-02-14 ENCOUNTER — Other Ambulatory Visit: Payer: Self-pay

## 2021-02-14 ENCOUNTER — Ambulatory Visit (INDEPENDENT_AMBULATORY_CARE_PROVIDER_SITE_OTHER): Payer: Medicare Other

## 2021-02-14 ENCOUNTER — Ambulatory Visit: Payer: Medicare Other | Admitting: Allergy

## 2021-02-14 DIAGNOSIS — J309 Allergic rhinitis, unspecified: Secondary | ICD-10-CM

## 2021-02-21 ENCOUNTER — Other Ambulatory Visit: Payer: Self-pay

## 2021-02-21 ENCOUNTER — Ambulatory Visit (INDEPENDENT_AMBULATORY_CARE_PROVIDER_SITE_OTHER): Payer: Medicare Other

## 2021-02-21 DIAGNOSIS — J309 Allergic rhinitis, unspecified: Secondary | ICD-10-CM | POA: Diagnosis not present

## 2021-02-28 ENCOUNTER — Ambulatory Visit (INDEPENDENT_AMBULATORY_CARE_PROVIDER_SITE_OTHER): Payer: Medicare Other

## 2021-02-28 ENCOUNTER — Other Ambulatory Visit: Payer: Self-pay

## 2021-02-28 DIAGNOSIS — J309 Allergic rhinitis, unspecified: Secondary | ICD-10-CM | POA: Diagnosis not present

## 2021-03-05 ENCOUNTER — Ambulatory Visit: Payer: Medicare Other | Admitting: Allergy

## 2021-03-05 ENCOUNTER — Ambulatory Visit (INDEPENDENT_AMBULATORY_CARE_PROVIDER_SITE_OTHER): Payer: Medicare Other

## 2021-03-05 ENCOUNTER — Other Ambulatory Visit: Payer: Self-pay

## 2021-03-05 DIAGNOSIS — J309 Allergic rhinitis, unspecified: Secondary | ICD-10-CM | POA: Diagnosis not present

## 2021-03-11 NOTE — Progress Notes (Addendum)
Follow Up Note  RE: Gabrielle Shaw MRN: [id number] DOB: June 16, 1961 Date of Office Visit: 03/12/2021  Referring provider: [name], MD Primary care provider: [name], MD  Chief Complaint: allergy (Doing pretty good. Needs a refill on Omeprazole & Methotrexate.) and Asthma (Doing Pretty Good. Needs refill on Symbicort.)  History of Present Illness: I had the pleasure of seeing Gabrielle Shaw for a follow up visit at the [facility] on 07/16/2021. She is a 60 y.o. female, who is being followed for asthma, allergic rhinoconjunctivitis on AIT, food allergy and GERD. Her previous allergy office visit was on 11/20/2020 with Dr. [name]. Today is a regular follow up visit.  Moderate persistent asthma  Currently on Symbicort 173mg 2 puffs twice a day.  Only uses albuterol on rare occasions - less than once a month. Denies any SOB, coughing, wheezing, chest tightness, nocturnal awakenings, ER/urgent care visits or prednisone use since the last visit.   Seasonal and perennial allergic rhinoconjunctivitis Some sneezing lately and wearing masks when outdoors which helps.  Currently on allergy injections every 4 weeks with no issues. She is doing much better on injections. She tried to stop injections but had worsening symptoms so it was restarted.  Using azelastine 1 spray per nostril QAM, Flonase 1 spray per nostril QPM - no nosebleeds.  Takes allegra, zyrtec or benadryl prn.  Uses olopatadine ey drops as needed with good benefit.    Food allergy Currently avoiding egg, peanuts, shellfish, bananas, and strawberries. No reactions.   Gastroesophageal reflux disease Controlled with PPI.  Diagnosed with RA and takes Enbrel and methotrexate and feeling much better.   Did well after the flu shot - she wants to continue to get her flu shots every year in our office.   Assessment and Plan: HDerrikais a 60y.o. female with: Moderate persistent asthma without  complication Well-controlled. Today's spirometry was normal. Daily controller medication(s): Symbicort 1644m 2 puffs twice a day with spacer and rinse mouth afterwards. Prior to physical activity: May use albuterol rescue inhaler 2 puffs 5 to 15 minutes prior to strenuous physical activities. Rescue medications: May use albuterol rescue inhaler 2 puffs or nebulizer every 4 to 6 hours as needed for shortness of breath, chest tightness, coughing, and wheezing. Monitor frequency of use.  During upper respiratory infections/asthma flares: start Flovent 11050m2 puffs twice a day with spacer and rinse mouth afterwards for 1-2 weeks until your breathing symptoms return to baseline. Get spirometry at next visit.   Seasonal and perennial allergic rhinoconjunctivitis Past history - on AIT (cat-tree-grass + mite-weed). Tried to stop before but restarted due to worsening symptoms.  Interim history - some sneezing.  Continue environmental control measures. Continue allergy injections. May use Flonase (fluticasone) nasal spray 2 sprays per nostril once a day as needed for nasal congestion.  May use azelastine nasal spray 1-2 sprays per nostril twice a day as needed for runny nose/drainage. May use olopatadine eye drops 0.2% once a day as needed for itchy/watery eyes. You may use over the counter refresh eye drops additionally if needed.  Nasal saline spray (i.e., Simply Saline) or nasal saline lavage (i.e., NeilMed) is recommended as needed and prior to medicated nasal sprays. May use over the counter antihistamines such as Zyrtec (cetirizine), Claritin (loratadine), Allegra (fexofenadine), or Xyzal (levocetirizine) daily as needed.  Food allergy No reactions.  Continue careful avoidance of egg, peanuts, shellfish, bananas, and strawberries. For mild symptoms you can take over the counter  antihistamines such as Benadryl and monitor symptoms closely. If symptoms worsen or if you have severe symptoms  including breathing issues, throat closure, significant swelling, whole body hives, severe diarrhea and vomiting, lightheadedness then inject epinephrine and seek immediate medical care afterwards. Food allergy action plan is in place.  Gastroesophageal reflux disease Controlled with PPI. Continue appropriate reflux lifestyle modifications. Continue omeprazole 36m one tablet in the morning. Nothing to eat or drink for 30 minutes afterwards.  Return in about 4 months (around 07/12/2021). ADDENDUM - refreshed assessment and plan so it will appear on note.  Meds ordered this encounter  Medications   omeprazole (PRILOSEC) 20 MG capsule    Sig: Take 1 capsule (20 mg total) by mouth in the morning.    Dispense:  30 capsule    Refill:  5   budesonide-formoterol (SYMBICORT) 160-4.5 MCG/ACT inhaler    Sig: Inhale 2 puffs into the lungs in the morning and at bedtime. with spacer and rinse mouth afterwards.    Dispense:  1 each    Refill:  5   fluticasone (FLONASE) 50 MCG/ACT nasal spray    Sig: Place 1-2 sprays into both nostrils daily.    Dispense:  16 g    Refill:  5   Azelastine HCl 0.15 % SOLN    Sig: May use azelastine nasal spray 1-2 sprays per nostril twice a day as needed for runny nose/drainage.    Dispense:  30 mL    Refill:  5   Olopatadine HCl 0.2 % SOLN    Sig: Apply 1 drop to eye daily as needed (itchy/watery eyes).    Dispense:  7.5 mL    Refill:  2    Please consider 90 day supplies to promote better adherence   albuterol (PROAIR HFA) 108 (90 Base) MCG/ACT inhaler    Sig: Inhale 2 puffs into the lungs every 4 (four) hours as needed for wheezing or shortness of breath.    Dispense:  1 each    Refill:  1   Lab Orders  No laboratory test(s) ordered today    Diagnostics: Spirometry:  Tracings reviewed. Her effort: Good reproducible efforts. FVC: 2.18L FEV1: 1.90L, 111% predicted FEV1/FVC ratio: 87% Interpretation: Spirometry consistent with normal pattern.  Please  see scanned spirometry results for details.  Medication List:  Current Outpatient Medications  Medication Sig Dispense Refill   albuterol (PROVENTIL) (2.5 MG/3ML) 0.083% nebulizer solution Take 3 mLs (2.5 mg total) by nebulization as needed. 75 mL 1   aspirin EC 81 MG tablet Take 81 mg by mouth as needed.      Azelastine HCl 0.15 % SOLN May use azelastine nasal spray 1-2 sprays per nostril twice a day as needed for runny nose/drainage. 30 mL 5   Blood Glucose Monitoring Suppl (ONETOUCH VERIO) w/Device KIT by Does not apply route.     cetirizine (ZYRTEC) 10 MG tablet Take 10 mg by mouth daily.     Dapsone 5 % topical gel Apply to face daily as needed.     desonide (DESOWEN) 0.05 % ointment Apply to the scalp once daily as needed for itching.     diphenhydrAMINE (BENADRYL) 50 MG tablet Take 50 mg by mouth every 6 (six) hours as needed for itching.     donepezil (ARICEPT) 5 MG tablet Take by mouth.     ELDERBERRY PO Take 1 tablet by mouth daily.     empagliflozin (JARDIANCE) 10 MG TABS tablet Take 1 tablet by mouth daily.  EPINEPHrine (EPIPEN 2-PAK) 0.3 mg/0.3 mL IJ SOAJ injection USE AS DIRECTED FOR SEVERE ALLERGIC REACTION 1 each 2   finasteride (PROSCAR) 5 MG tablet Take one tablet by mouth daily     fluocinonide ointment (LIDEX) 0.05 % APPLY OINTMENT TO SCALP 2 TO 3 TIMES PER WEEK **DO NOT APPLY TO FACE**     folic acid (FOLVITE) 1 MG tablet Take 1 mg by mouth daily.     HYDROcodone-acetaminophen (NORCO/VICODIN) 5-325 MG tablet Take 1 tablet by mouth every 6 (six) hours as needed.     hydrOXYzine (ATARAX/VISTARIL) 25 MG tablet ONE TABLET AT BEDTIME AS NEEDED FOR ITCHING. 30 tablet 3   ibuprofen (ADVIL,MOTRIN) 800 MG tablet Take 1 tablet (800 mg total) by mouth every 8 (eight) hours as needed for mild pain. 30 tablet 0   ivermectin (STROMECTOL) 3 MG TABS tablet If exposed but no symptoms: Take 5 1/2 tablets per day 48 hours apart If experiencing symptoms: Take 5/12 tablets daily a minimum  of 2 days, continue recovered (maximium 5 days) 30 tablet 0   levocetirizine (XYZAL) 5 MG tablet Take 1 tablet (5 mg total) by mouth daily as needed for allergies. 30 tablet 3   lisinopril-hydrochlorothiazide (PRINZIDE,ZESTORETIC) 10-12.5 MG tablet Take 0.5 tablets by mouth.      meloxicam (MOBIC) 7.5 MG tablet Take by mouth.     methocarbamol (ROBAXIN) 500 MG tablet Take 500 mg by mouth 3 (three) times daily.     methotrexate 2.5 MG tablet      minoxidil (LONITEN) 2.5 MG tablet SMARTSIG:.5 Tablet(s) By Mouth Daily     NON FORMULARY      potassium chloride (KLOR-CON) 10 MEQ tablet Take 10 mEq by mouth 2 (two) times daily.     pregabalin (LYRICA) 100 MG capsule Take by mouth.     traMADol (ULTRAM) 50 MG tablet TAKE 1 TABLET BY MOUTH EVERY 6 HOURS AS NEEDED FOR PAIN FOR UP TO 5 DAYS     triamcinolone acetonide (KENALOG-40) 40 MG/ML injection (RADIOLOGY ONLY) Inject into the articular space.     ZINC SULFATE ER PO Take by mouth.     albuterol (PROAIR HFA) 108 (90 Base) MCG/ACT inhaler Inhale 2 puffs into the lungs every 4 (four) hours as needed for wheezing or shortness of breath. 1 each 1   budesonide-formoterol (SYMBICORT) 160-4.5 MCG/ACT inhaler Inhale 2 puffs into the lungs in the morning and at bedtime. with spacer and rinse mouth afterwards. 1 each 5   Dapsone (ACZONE) 7.5 % GEL Apply to face daily 60 g 2   fluticasone (FLONASE) 50 MCG/ACT nasal spray Place 1-2 sprays into both nostrils daily. 16 g 5   lisinopril-hydrochlorothiazide (ZESTORETIC) 10-12.5 MG tablet Take by mouth.     metroNIDAZOLE (METROGEL) 1 % gel Apply to face daily for Rosacea 45 g 3   Olopatadine HCl 0.2 % SOLN Apply 1 drop to eye daily as needed (itchy/watery eyes). 7.5 mL 2   omeprazole (PRILOSEC) 20 MG capsule Take 1 capsule (20 mg total) by mouth in the morning. 30 capsule 5   No current facility-administered medications for this visit.   Allergies: Allergies  Allergen Reactions   Iodinated Diagnostic Agents  Anaphylaxis    Cardiac Arrest Seizure   Other Hives   Shellfish Allergy Hives   Sulfasalazine Hives   Tetrofosmin [Technetium-87m Hives and Anaphylaxis   Banana Hives   Cat Hair Extract Other (See Comments)    Hives    Eggs Or Egg-Derived Products Nausea And Vomiting  Hemophilus B Polysaccharide Vaccine Other (See Comments)    OTHER  OTHER     Strawberry Extract Hives   Sulfamethoxazole Rash   Biaxin [Clarithromycin] Hives   Latex Hives   Peanuts [Peanut Oil] Hives   Pravastatin Hives   Shellfish-Derived Products Hives   Sulfa Antibiotics Hives and Rash   I reviewed her past medical history, social history, family history, and environmental history and no significant changes have been reported from her previous visit.  Review of Systems  Constitutional:  Negative for appetite change, chills, fever and unexpected weight change.  HENT:  Negative for congestion, postnasal drip and rhinorrhea.   Eyes:  Negative for itching.  Respiratory:  Negative for cough, chest tightness, shortness of breath and wheezing.   Gastrointestinal:  Negative for abdominal pain.  Skin:  Negative for rash.  Allergic/Immunologic: Positive for environmental allergies and food allergies.  Neurological:  Negative for headaches.  Objective: BP 138/78 (BP Location: Right Arm, Patient Position: Sitting, Cuff Size: Normal)   Pulse 70   SpO2 97%  There is no height or weight on file to calculate BMI. Physical Exam Vitals and nursing note reviewed.  Constitutional:      Appearance: Normal appearance. She is well-developed.  HENT:     Head: Normocephalic and atraumatic.     Right Ear: Tympanic membrane and external ear normal.     Left Ear: Tympanic membrane and external ear normal.     Nose: Nose normal.     Mouth/Throat:     Mouth: Mucous membranes are moist.     Pharynx: Oropharynx is clear.  Eyes:     Conjunctiva/sclera: Conjunctivae normal.  Cardiovascular:     Rate and Rhythm: Normal rate  and regular rhythm.     Heart sounds: Normal heart sounds. No murmur heard. Pulmonary:     Effort: Pulmonary effort is normal.     Breath sounds: Normal breath sounds. No wheezing, rhonchi or rales.  Musculoskeletal:     Cervical back: Neck supple.  Skin:    General: Skin is warm.     Findings: No rash.  Neurological:     Mental Status: She is alert and oriented to person, place, and time.  Psychiatric:        Behavior: Behavior normal.   Previous notes and tests were reviewed. The plan was reviewed with the patient/family, and all questions/concerned were addressed.  It was my pleasure to see Trenace today and participate in her care. Please feel free to contact me with any questions or concerns.  Sincerely,  Rexene Alberts, DO Allergy & Immunology  [facility] office: [place] office: [phone]

## 2021-03-12 ENCOUNTER — Other Ambulatory Visit: Payer: Self-pay

## 2021-03-12 ENCOUNTER — Encounter: Payer: Self-pay | Admitting: Allergy

## 2021-03-12 ENCOUNTER — Ambulatory Visit (INDEPENDENT_AMBULATORY_CARE_PROVIDER_SITE_OTHER): Payer: Medicare Other | Admitting: Allergy

## 2021-03-12 VITALS — BP 138/78 | HR 70

## 2021-03-12 DIAGNOSIS — J4540 Moderate persistent asthma, uncomplicated: Secondary | ICD-10-CM

## 2021-03-12 DIAGNOSIS — J302 Other seasonal allergic rhinitis: Secondary | ICD-10-CM | POA: Diagnosis not present

## 2021-03-12 DIAGNOSIS — H1013 Acute atopic conjunctivitis, bilateral: Secondary | ICD-10-CM

## 2021-03-12 DIAGNOSIS — H1010 Acute atopic conjunctivitis, unspecified eye: Secondary | ICD-10-CM

## 2021-03-12 DIAGNOSIS — T7800XD Anaphylactic reaction due to unspecified food, subsequent encounter: Secondary | ICD-10-CM | POA: Diagnosis not present

## 2021-03-12 DIAGNOSIS — K219 Gastro-esophageal reflux disease without esophagitis: Secondary | ICD-10-CM

## 2021-03-12 DIAGNOSIS — J3089 Other allergic rhinitis: Secondary | ICD-10-CM

## 2021-03-12 MED ORDER — FLUTICASONE PROPIONATE 50 MCG/ACT NA SUSP: 1.0000 | Freq: Every day | NASAL | 5 refills | Status: DC

## 2021-03-12 MED ORDER — ALBUTEROL SULFATE HFA 108 (90 BASE) MCG/ACT IN AERS: 2.0000 | INHALATION_SPRAY | RESPIRATORY_TRACT | 1 refills | Status: DC | PRN

## 2021-03-12 MED ORDER — BUDESONIDE-FORMOTEROL FUMARATE 160-4.5 MCG/ACT IN AERO: 2.0000 | INHALATION_SPRAY | Freq: Two times a day (BID) | RESPIRATORY_TRACT | 5 refills | Status: DC

## 2021-03-12 MED ORDER — OLOPATADINE HCL 0.2 % OP SOLN: 1.0000 [drp] | Freq: Every day | OPHTHALMIC | 2 refills | Status: DC | PRN

## 2021-03-12 MED ORDER — AZELASTINE HCL 0.15 % NA SOLN: NASAL | 5 refills | Status: DC

## 2021-03-12 MED ORDER — OMEPRAZOLE 20 MG PO CPDR
20.0000 mg | DELAYED_RELEASE_CAPSULE | Freq: Every morning | ORAL | 5 refills | Status: DC
Start: 2021-03-12 — End: 2021-10-18

## 2021-03-12 NOTE — Assessment & Plan Note (Signed)
Past history - on AIT (cat-tree-grass + mite-weed). Tried to stop before but restarted due to worsening symptoms.  Interim history - some sneezing.   Continue environmental control measures.  Continue allergy injections.  May use Flonase (fluticasone) nasal spray 2 sprays per nostril once a day as needed for nasal congestion.   May use azelastine nasal spray 1-2 sprays per nostril twice a day as needed for runny nose/drainage.  May use olopatadine eye drops 0.2% once a day as needed for itchy/watery eyes.  You may use over the counter refresh eye drops additionally if needed.   Nasal saline spray (i.e., Simply Saline) or nasal saline lavage (i.e., NeilMed) is recommended as needed and prior to medicated nasal sprays.  May use over the counter antihistamines such as Zyrtec (cetirizine), Claritin (loratadine), Allegra (fexofenadine), or Xyzal (levocetirizine) daily as needed.

## 2021-03-12 NOTE — Assessment & Plan Note (Signed)
Well-controlled. . Today's spirometry was normal. . Daily controller medication(s): Symbicort 149mcg 2 puffs twice a day with spacer and rinse mouth afterwards. . Prior to physical activity: May use albuterol rescue inhaler 2 puffs 5 to 15 minutes prior to strenuous physical activities. Marland Kitchen Rescue medications: May use albuterol rescue inhaler 2 puffs or nebulizer every 4 to 6 hours as needed for shortness of breath, chest tightness, coughing, and wheezing. Monitor frequency of use.  . During upper respiratory infections/asthma flares: start Flovent 148mcg 2 puffs twice a day with spacer and rinse mouth afterwards for 1-2 weeks until your breathing symptoms return to baseline. . Get spirometry at next visit.

## 2021-03-12 NOTE — Patient Instructions (Addendum)
Moderate persistent asthma . Daily controller medication(s): Symbicort 157mcg 2 puffs twice a day with spacer and rinse mouth afterwards. . Prior to physical activity: May use albuterol rescue inhaler 2 puffs 5 to 15 minutes prior to strenuous physical activities. Marland Kitchen Rescue medications: May use albuterol rescue inhaler 2 puffs or nebulizer every 4 to 6 hours as needed for shortness of breath, chest tightness, coughing, and wheezing. Monitor frequency of use.  . During upper respiratory infections/asthma flares: start Flovent 159mcg 2 puffs twice a day with spacer and rinse mouth afterwards for 1-2 weeks until your breathing symptoms return to baseline.  . Asthma control goals:  o Full participation in all desired activities (may need albuterol before activity) o Albuterol use two times or less a week on average (not counting use with activity) o Cough interfering with sleep two times or less a month o Oral steroids no more than once a year o No hospitalizations  Seasonal and perennial allergic rhinitis  Continue environmental control measures.  Continue allergy injections.  May use Flonase (fluticasone) nasal spray 2 sprays per nostril once a day as needed for nasal congestion.   May use azelastine nasal spray 1-2 sprays per nostril twice a day as needed for runny nose/drainage.  May use olopatadine eye drops 0.2% once a day as needed for itchy/watery eyes.  You may use over the counter refresh eye drops additionally if needed.   Nasal saline spray (i.e., Simply Saline) or nasal saline lavage (i.e., NeilMed) is recommended as needed and prior to medicated nasal sprays.  May use over the counter antihistamines such as Zyrtec (cetirizine), Claritin (loratadine), Allegra (fexofenadine), or Xyzal (levocetirizine) daily as needed.  Food allergy  Continue careful avoidance of egg, peanuts, shellfish, bananas, and strawberries.  For mild symptoms you can take over the counter antihistamines  such as Benadryl and monitor symptoms closely. If symptoms worsen or if you have severe symptoms including breathing issues, throat closure, significant swelling, whole body hives, severe diarrhea and vomiting, lightheadedness then inject epinephrine and seek immediate medical care afterwards.  Food allergy action plan is in place.  GERD (gastroesophageal reflux disease)  Continue appropriate reflux lifestyle modifications.  Continue omeprazole 20mg  one tablet in the morning. Nothing to eat or drink for 30 minutes afterwards.   Flu shots:  Get flu shot in our office in the fall every year.  Follow up in 4 months or sooner if needed.

## 2021-03-12 NOTE — Assessment & Plan Note (Signed)
No reactions.   Continue careful avoidance of egg, peanuts, shellfish, bananas, and strawberries.  For mild symptoms you can take over the counter antihistamines such as Benadryl and monitor symptoms closely. If symptoms worsen or if you have severe symptoms including breathing issues, throat closure, significant swelling, whole body hives, severe diarrhea and vomiting, lightheadedness then inject epinephrine and seek immediate medical care afterwards.  Food allergy action plan is in place.

## 2021-03-12 NOTE — Assessment & Plan Note (Signed)
Controlled with PPI.  Continue appropriate reflux lifestyle modifications.  Continue omeprazole 20mg  one tablet in the morning. Nothing to eat or drink for 30 minutes afterwards.

## 2021-04-02 ENCOUNTER — Ambulatory Visit (INDEPENDENT_AMBULATORY_CARE_PROVIDER_SITE_OTHER): Payer: Medicare Other

## 2021-04-02 ENCOUNTER — Other Ambulatory Visit: Payer: Self-pay

## 2021-04-02 DIAGNOSIS — J309 Allergic rhinitis, unspecified: Secondary | ICD-10-CM

## 2021-04-08 ENCOUNTER — Ambulatory Visit: Payer: Medicare Other | Admitting: Dermatology

## 2021-05-02 ENCOUNTER — Ambulatory Visit (INDEPENDENT_AMBULATORY_CARE_PROVIDER_SITE_OTHER): Payer: Medicare Other

## 2021-05-02 ENCOUNTER — Other Ambulatory Visit: Payer: Self-pay

## 2021-05-02 DIAGNOSIS — J309 Allergic rhinitis, unspecified: Secondary | ICD-10-CM

## 2021-05-28 ENCOUNTER — Ambulatory Visit (INDEPENDENT_AMBULATORY_CARE_PROVIDER_SITE_OTHER): Payer: Medicare Other

## 2021-05-28 ENCOUNTER — Other Ambulatory Visit: Payer: Self-pay

## 2021-05-28 DIAGNOSIS — J309 Allergic rhinitis, unspecified: Secondary | ICD-10-CM | POA: Diagnosis not present

## 2021-05-29 ENCOUNTER — Ambulatory Visit (INDEPENDENT_AMBULATORY_CARE_PROVIDER_SITE_OTHER): Payer: Medicare Other | Admitting: Dermatology

## 2021-05-29 DIAGNOSIS — Z1283 Encounter for screening for malignant neoplasm of skin: Secondary | ICD-10-CM

## 2021-05-29 DIAGNOSIS — D229 Melanocytic nevi, unspecified: Secondary | ICD-10-CM

## 2021-05-29 DIAGNOSIS — L578 Other skin changes due to chronic exposure to nonionizing radiation: Secondary | ICD-10-CM | POA: Diagnosis not present

## 2021-05-29 DIAGNOSIS — D1800 Hemangioma unspecified site: Secondary | ICD-10-CM

## 2021-05-29 DIAGNOSIS — L814 Other melanin hyperpigmentation: Secondary | ICD-10-CM | POA: Diagnosis not present

## 2021-05-29 DIAGNOSIS — L719 Rosacea, unspecified: Secondary | ICD-10-CM

## 2021-05-29 DIAGNOSIS — L821 Other seborrheic keratosis: Secondary | ICD-10-CM

## 2021-05-29 MED ORDER — DAPSONE 7.5 % EX GEL: CUTANEOUS | 2 refills | Status: AC

## 2021-05-29 MED ORDER — METRONIDAZOLE 1 % EX GEL: CUTANEOUS | 3 refills | Status: AC

## 2021-05-29 NOTE — Patient Instructions (Signed)

## 2021-05-29 NOTE — Progress Notes (Signed)
   New Patient Visit  Subjective  Gabrielle Shaw is a 60 y.o. female who presents for the following: Annual Exam. Hx of acne/rosacea treating with Doxycycline tablets, Aczone gel, Metronidazole gel and Retin A pump with a good response  The patient presents for Total-Body Skin Exam (TBSE) for skin cancer screening and mole check.  The following portions of the chart were reviewed this encounter and updated as appropriate:   Tobacco  Allergies  Meds  Problems  Med Hx  Surg Hx  Fam Hx      Review of Systems:  No other skin or systemic complaints except as noted in HPI or Assessment and Plan.  Objective  Well appearing patient in no apparent distress; mood and affect are within normal limits.  A full examination was performed including scalp, head, eyes, ears, nose, lips, neck, chest, axillae, abdomen, back, buttocks, bilateral upper extremities, bilateral lower extremities, hands, feet, fingers, toes, fingernails, and toenails. All findings within normal limits unless otherwise noted below.  face Mid face erythema with telangiectasias    Assessment & Plan  Rosacea face  Acne/Rosacea  Rosacea is a chronic progressive skin condition usually affecting the face of adults, causing redness and/or acne bumps. It is treatable but not curable. It sometimes affects the eyes (ocular rosacea) as well. It may respond to topical and/or systemic medication and can flare with stress, sun exposure, alcohol, exercise and some foods.  Daily application of broad spectrum spf 30+ sunscreen to face is recommended to reduce flares.   Cont Metronidazole gel apply to face daily  Cont Aczone gel apply to face daily  Cont Retin A pump apply to face at bedtime   Related Medications Dapsone (ACZONE) 7.5 % GEL Apply to face daily  metroNIDAZOLE (METROGEL) 1 % gel Apply to face daily for Rosacea  Skin cancer screening   Lentigines - Scattered tan macules - Due to sun exposure - Benign-appering,  observe - Recommend daily broad spectrum sunscreen SPF 30+ to sun-exposed areas, reapply every 2 hours as needed. - Call for any changes  Seborrheic Keratoses - Stuck-on, waxy, tan-brown papules and/or plaques  - Benign-appearing - Discussed benign etiology and prognosis. - Observe - Call for any changes  Melanocytic Nevi - Tan-brown and/or pink-flesh-colored symmetric macules and papules - Benign appearing on exam today - Observation - Call clinic for new or changing moles - Recommend daily use of broad spectrum spf 30+ sunscreen to sun-exposed areas.   Hemangiomas - Red papules - Discussed benign nature - Observe - Call for any changes  Actinic Damage - Chronic condition, secondary to cumulative UV/sun exposure - diffuse scaly erythematous macules with underlying dyspigmentation - Recommend daily broad spectrum sunscreen SPF 30+ to sun-exposed areas, reapply every 2 hours as needed.  - Staying in the shade or wearing long sleeves, sun glasses (UVA+UVB protection) and wide brim hats (4-inch brim around the entire circumference of the hat) are also recommended for sun protection.  - Call for new or changing lesions.  Skin cancer screening performed today.  Return in about 1 year (around 05/29/2022) for hx of Rosacea .  IMarye Round, CMA, am acting as scribe for Sarina Ser, MD .  Documentation: I have reviewed the above documentation for accuracy and completeness, and I agree with the above.  Sarina Ser, MD

## 2021-05-30 ENCOUNTER — Encounter: Payer: Self-pay | Admitting: Dermatology

## 2021-06-18 ENCOUNTER — Other Ambulatory Visit: Payer: Self-pay

## 2021-06-18 ENCOUNTER — Ambulatory Visit (INDEPENDENT_AMBULATORY_CARE_PROVIDER_SITE_OTHER): Payer: Medicare Other

## 2021-06-18 DIAGNOSIS — J309 Allergic rhinitis, unspecified: Secondary | ICD-10-CM | POA: Diagnosis not present

## 2021-07-16 ENCOUNTER — Ambulatory Visit: Payer: Medicare Other | Admitting: Allergy

## 2021-07-16 ENCOUNTER — Ambulatory Visit (INDEPENDENT_AMBULATORY_CARE_PROVIDER_SITE_OTHER): Payer: Medicare Other

## 2021-07-16 ENCOUNTER — Other Ambulatory Visit: Payer: Self-pay

## 2021-07-16 DIAGNOSIS — J309 Allergic rhinitis, unspecified: Secondary | ICD-10-CM

## 2021-07-16 NOTE — Progress Notes (Deleted)
Follow Up Note  RE: [name] MRN: [id number] DOB: [birth year] Date of Office Visit: 07/16/2021  Referring provider: [name], MD Primary care provider: [name], MD  Chief Complaint: No chief complaint on file.  History of Present Illness: I had the pleasure of seeing Gabrielle Shaw for a follow up visit at the [facility] on 07/16/2021. She is a 60 y.o. female, who is being followed for asthma, allergic rhinoconjunctivitis on AIT, food allergy and GERD. Her previous allergy office visit was on 03/12/2021 with Dr. [name]. Today is a regular follow up visit.  Moderate persistent asthma without complication Well-controlled. Today's spirometry was normal. Daily controller medication(s): Symbicort 164mg 2 puffs twice a day with spacer and rinse mouth afterwards. Prior to physical activity: May use albuterol rescue inhaler 2 puffs 5 to 15 minutes prior to strenuous physical activities. Rescue medications: May use albuterol rescue inhaler 2 puffs or nebulizer every 4 to 6 hours as needed for shortness of breath, chest tightness, coughing, and wheezing. Monitor frequency of use. During upper respiratory infections/asthma flares: start Flovent 1167m 2 puffs twice a day with spacer and rinse mouth afterwards for 1-2 weeks until your breathing symptoms return to baseline. Get spirometry at next visit.    Seasonal and perennial allergic rhinoconjunctivitis Past history - on AIT (cat-tree-grass + mite-weed). Tried to stop before but restarted due to worsening symptoms.  Interim history - some sneezing.  Continue environmental control measures. Continue allergy injections. May use Flonase (fluticasone) nasal spray 2 sprays per nostril once a day as needed for nasal congestion.  May use azelastine nasal spray 1-2 sprays per nostril twice a day as needed for runny nose/drainage. May use olopatadine eye drops 0.2% once a day as needed for itchy/watery eyes. You may  use over the counter refresh eye drops additionally if needed. Nasal saline spray (i.e., Simply Saline) or nasal saline lavage (i.e., NeilMed) is recommended as needed and prior to medicated nasal sprays. May use over the counter antihistamines such as Zyrtec (cetirizine), Claritin (loratadine), Allegra (fexofenadine), or Xyzal (levocetirizine) daily as needed.   Food allergy No reactions.  Continue careful avoidance of egg, peanuts, shellfish, bananas, and strawberries. For mild symptoms you can take over the counter antihistamines such as Benadryl and monitor symptoms closely. If symptoms worsen or if you have severe symptoms including breathing issues, throat closure, significant swelling, whole body hives, severe diarrhea and vomiting, lightheadedness then inject epinephrine and seek immediate medical care afterwards. Food allergy action plan is in place.   Gastroesophageal reflux disease Controlled with PPI. Continue appropriate reflux lifestyle modifications. Continue omeprazole 2071mne tablet in the morning. Nothing to eat or drink for 30 minutes afterwards.  Assessment and Plan: Gabrielle Shaw a 59 76o. female with: No problem-specific Assessment & Plan notes found for this encounter.  No follow-ups on file.  No orders of the defined types were placed in this encounter.  Lab Orders  No laboratory test(s) ordered today    Diagnostics: Spirometry:  Tracings reviewed. Her effort: {Blank single:19197::"Good reproducible efforts.","It was hard to get consistent efforts and there is a question as to whether this reflects a maximal maneuver.","Poor effort, data can not be interpreted."} FVC: ***L FEV1: ***L, ***% predicted FEV1/FVC ratio: ***% Interpretation: {Blank single:19197::"Spirometry consistent with mild obstructive disease","Spirometry consistent with moderate obstructive disease","Spirometry consistent with severe obstructive disease","Spirometry consistent with possible  restrictive disease","Spirometry consistent with mixed obstructive and restrictive disease","Spirometry uninterpretable due to technique","Spirometry consistent with normal pattern","No overt  abnormalities noted given today's efforts"}.  Please see scanned spirometry results for details.  Skin Testing: {Blank single:19197::"Select foods","Environmental allergy panel","Environmental allergy panel and select foods","Food allergy panel","None","Deferred due to recent antihistamines use"}. Positive test to: ***. Negative test to: ***.  Results discussed with patient/family.   Medication List:  Current Outpatient Medications  Medication Sig Dispense Refill   albuterol (PROAIR HFA) 108 (90 Base) MCG/ACT inhaler Inhale 2 puffs into the lungs every 4 (four) hours as needed for wheezing or shortness of breath. 1 each 1   albuterol (PROVENTIL) (2.5 MG/3ML) 0.083% nebulizer solution Take 3 mLs (2.5 mg total) by nebulization as needed. 75 mL 1   aspirin EC 81 MG tablet Take 81 mg by mouth as needed.      Azelastine HCl 0.15 % SOLN May use azelastine nasal spray 1-2 sprays per nostril twice a day as needed for runny nose/drainage. 30 mL 5   Blood Glucose Monitoring Suppl (ONETOUCH VERIO) w/Device KIT by Does not apply route.     budesonide-formoterol (SYMBICORT) 160-4.5 MCG/ACT inhaler Inhale 2 puffs into the lungs in the morning and at bedtime. with spacer and rinse mouth afterwards. 1 each 5   cetirizine (ZYRTEC) 10 MG tablet Take 10 mg by mouth daily.     Dapsone (ACZONE) 7.5 % GEL Apply to face daily 60 g 2   Dapsone 5 % topical gel Apply to face daily as needed.     desonide (DESOWEN) 0.05 % ointment Apply to the scalp once daily as needed for itching.     diphenhydrAMINE (BENADRYL) 50 MG tablet Take 50 mg by mouth every 6 (six) hours as needed for itching.     donepezil (ARICEPT) 5 MG tablet Take by mouth.     ELDERBERRY PO Take 1 tablet by mouth daily.     empagliflozin (JARDIANCE) 10 MG TABS  tablet Take 1 tablet by mouth daily.     EPINEPHrine (EPIPEN 2-PAK) 0.3 mg/0.3 mL IJ SOAJ injection USE AS DIRECTED FOR SEVERE ALLERGIC REACTION 1 each 2   finasteride (PROSCAR) 5 MG tablet Take one tablet by mouth daily     fluocinonide ointment (LIDEX) 0.05 % APPLY OINTMENT TO SCALP 2 TO 3 TIMES PER WEEK **DO NOT APPLY TO FACE**     fluticasone (FLONASE) 50 MCG/ACT nasal spray Place 1-2 sprays into both nostrils daily. 16 g 5   folic acid (FOLVITE) 1 MG tablet Take 1 mg by mouth daily.     HYDROcodone-acetaminophen (NORCO/VICODIN) 5-325 MG tablet Take 1 tablet by mouth every 6 (six) hours as needed.     hydrOXYzine (ATARAX/VISTARIL) 25 MG tablet ONE TABLET AT BEDTIME AS NEEDED FOR ITCHING. 30 tablet 3   ibuprofen (ADVIL,MOTRIN) 800 MG tablet Take 1 tablet (800 mg total) by mouth every 8 (eight) hours as needed for mild pain. 30 tablet 0   ivermectin (STROMECTOL) 3 MG TABS tablet If exposed but no symptoms: Take 5 1/2 tablets per day 48 hours apart If experiencing symptoms: Take 5/12 tablets daily a minimum of 2 days, continue recovered (maximium 5 days) 30 tablet 0   levocetirizine (XYZAL) 5 MG tablet Take 1 tablet (5 mg total) by mouth daily as needed for allergies. 30 tablet 3   lisinopril-hydrochlorothiazide (PRINZIDE,ZESTORETIC) 10-12.5 MG tablet Take 0.5 tablets by mouth.      lisinopril-hydrochlorothiazide (ZESTORETIC) 10-12.5 MG tablet Take by mouth.     meloxicam (MOBIC) 7.5 MG tablet Take by mouth.     methocarbamol (ROBAXIN) 500 MG tablet Take 500 mg  by mouth 3 (three) times daily.     methotrexate 2.5 MG tablet      metroNIDAZOLE (METROGEL) 1 % gel Apply to face daily for Rosacea 45 g 3   minoxidil (LONITEN) 2.5 MG tablet SMARTSIG:.5 Tablet(s) By Mouth Daily     NON FORMULARY      Olopatadine HCl 0.2 % SOLN Apply 1 drop to eye daily as needed (itchy/watery eyes). 7.5 mL 2   omeprazole (PRILOSEC) 20 MG capsule Take 1 capsule (20 mg total) by mouth in the morning. 30 capsule 5    potassium chloride (KLOR-CON) 10 MEQ tablet Take 10 mEq by mouth 2 (two) times daily.     pregabalin (LYRICA) 100 MG capsule Take by mouth.     traMADol (ULTRAM) 50 MG tablet TAKE 1 TABLET BY MOUTH EVERY 6 HOURS AS NEEDED FOR PAIN FOR UP TO 5 DAYS     triamcinolone acetonide (KENALOG-40) 40 MG/ML injection (RADIOLOGY ONLY) Inject into the articular space.     ZINC SULFATE ER PO Take by mouth.     No current facility-administered medications for this visit.   Allergies: Allergies  Allergen Reactions   Iodinated Diagnostic Agents Anaphylaxis    Cardiac Arrest Seizure   Other Hives   Shellfish Allergy Hives   Sulfasalazine Hives   Tetrofosmin [Technetium-67m Hives and Anaphylaxis   Banana Hives   Cat Hair Extract Other (See Comments)    Hives    Eggs Or Egg-Derived Products Nausea And Vomiting   Hemophilus B Polysaccharide Vaccine Other (See Comments)    OTHER  OTHER     Strawberry Extract Hives   Sulfamethoxazole Rash   Biaxin [Clarithromycin] Hives   Latex Hives   Peanuts [Peanut Oil] Hives   Pravastatin Hives   Shellfish-Derived Products Hives   Sulfa Antibiotics Hives and Rash   I reviewed her past medical history, social history, family history, and environmental history and no significant changes have been reported from her previous visit.  Review of Systems  Constitutional:  Negative for appetite change, chills, fever and unexpected weight change.  HENT:  Negative for congestion, postnasal drip and rhinorrhea.   Eyes:  Negative for itching.  Respiratory:  Negative for cough, chest tightness, shortness of breath and wheezing.   Gastrointestinal:  Negative for abdominal pain.  Skin:  Negative for rash.  Allergic/Immunologic: Positive for environmental allergies and food allergies.  Neurological:  Negative for headaches.   Objective: There were no vitals taken for this visit. There is no height or weight on file to calculate BMI. Physical Exam Vitals and nursing  note reviewed.  Constitutional:      Appearance: Normal appearance. She is well-developed.  HENT:     Head: Normocephalic and atraumatic.     Right Ear: Tympanic membrane and external ear normal.     Left Ear: Tympanic membrane and external ear normal.     Nose: Nose normal.     Mouth/Throat:     Mouth: Mucous membranes are moist.     Pharynx: Oropharynx is clear.  Eyes:     Conjunctiva/sclera: Conjunctivae normal.  Cardiovascular:     Rate and Rhythm: Normal rate and regular rhythm.     Heart sounds: Normal heart sounds. No murmur heard. Pulmonary:     Effort: Pulmonary effort is normal.     Breath sounds: Normal breath sounds. No wheezing, rhonchi or rales.  Musculoskeletal:     Cervical back: Neck supple.  Skin:    General: Skin is warm.  Findings: No rash.  Neurological:     Mental Status: She is alert and oriented to person, place, and time.  Psychiatric:        Behavior: Behavior normal.   Previous notes and tests were reviewed. The plan was reviewed with the patient/family, and all questions/concerned were addressed.  It was my pleasure to see Gabrielle Shaw today and participate in her care. Please feel free to contact me with any questions or concerns.  Sincerely,  Rexene Alberts, DO Allergy & Immunology  [facility] office: [place] office: [phone]

## 2021-07-30 NOTE — Progress Notes (Signed)
VIALS MADE. EXP 07-30-22 

## 2021-07-31 DIAGNOSIS — J3081 Allergic rhinitis due to animal (cat) (dog) hair and dander: Secondary | ICD-10-CM | POA: Diagnosis not present

## 2021-08-01 DIAGNOSIS — J3089 Other allergic rhinitis: Secondary | ICD-10-CM | POA: Diagnosis not present

## 2021-08-07 NOTE — Progress Notes (Signed)
Follow Up Note  RE: Gabrielle Shaw MRN: [id number] DOB: 11/22/1961 Date of Office Visit: 08/08/2021  Referring provider: [name], MD Primary care provider: [name], MD  Chief Complaint: Asthma  History of Present Illness: I had the pleasure of seeing Gabrielle Shaw for a follow up visit at the [facility] on 08/08/2021. She is a 60 y.o. female, who is being followed for asthma, allergic rhinoconjunctivitis on AIT, food allergy and GERD. Her previous allergy office visit was on 03/12/2021 with Dr. [name]. Today is a regular follow up visit.  Moderate persistent asthma ACT score 19.  Currently on Symbicort 132mg 2 puffs Bid Denies any SOB, coughing, wheezing, chest tightness, nocturnal awakenings, ER/urgent care visits or prednisone use since the last visit.   Seasonal and perennial allergic rhinoconjunctivitis Patient on allergy injections for many years with no issues. Currently on every 4 weeks and wants to continue as she tried to stop injections in the past with worsening symptoms.  Takes allegra daily and using Flonase 1 spray per nostril BID. Minimal nosebleeds. Uses pataday daily and was not sure if she could use it only a needed. Only using azelastine prn.   Food allergy No reactions since the last visit. Usually had swelling and hives from the eggs, peanuts, shellfish, bananas, and strawberries. Had a reaction after eating a snicker bar ice cream and required an Epipen injection in the past. 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 Patient failed egg challenge in the past with vomiting.  Avoiding tree nuts as well. She had a very small bite of flounder with no issues about 1 year ago. Patient is interested in eating fish though.  Tolerates baked egg items with no issues.  Gastroesophageal reflux disease Controlled with PPI.  Assessment and Plan: [name] a 60y.o. female with: Moderate persistent asthma without  complication Well-controlled. Act score 19.  Today's spirometry was normal. Daily controller medication(s): Symbicort 1655m 2 puffs twice a day with spacer and rinse mouth afterwards. Prior to physical activity: May use albuterol rescue inhaler 2 puffs 5 to 15 minutes prior to strenuous physical activities. Rescue medications: May use albuterol rescue inhaler 2 puffs or nebulizer every 4 to 6 hours as needed for shortness of breath, chest tightness, coughing, and wheezing. Monitor frequency of use.  During upper respiratory infections/asthma flares: start Flovent 11068m2 puffs twice a day with spacer and rinse mouth afterwards for 1-2 weeks until your breathing symptoms return to baseline.  Get spirometry at next visit.  Seasonal and perennial allergic rhinoconjunctivitis Past history - on AIT (cat-tree-grass + mite-weed). Tried to stop before but restarted due to worsening symptoms.  Interim history - well controlled with below regimen. Continue environmental control measures. Continue allergy injections - given today.  Use Flonase (fluticasone) nasal spray 1 spray per nostril twice a day as needed for nasal congestion.  Use azelastine nasal spray 1-2 sprays per nostril twice a day as needed for runny nose/drainage. ONLY USE the eye drops if needed. Olopatadine eye drops 0.2% once a day as needed for itchy/watery eyes. You may use over the counter refresh eye drops additionally if needed.  Nasal saline spray (i.e., Simply Saline) or nasal saline lavage (i.e., NeilMed) is recommended as needed and prior to medicated nasal sprays. May use over the counter antihistamines such as Zyrtec (cetirizine), Claritin (loratadine), Allegra (fexofenadine), or Xyzal (levocetirizine) daily as needed.  Anaphylactic reaction due to other food products, subsequent encounter Past history -  2018 blood work was negative to egg. Peanuts caused anaphylaxis requiring Epipen. Avoiding tree nuts. Failed egg  challenge? Reaction to shrimp in the past. Had small bite of flounder with no issues. Interim history - interested in eating finned fish again only.  Unable to skin test today due to recent antihistamine intake. Continue careful avoidance of egg, peanuts, shellfish, bananas, and strawberries. Get bloodwork and if negative will schedule for a flounder fish challenge. Patient no interested in other food reintroduction.  Will need prick testing prior to food challenge. We are ordering labs, so please allow 1-2 weeks for the results to come back. With the newly implemented Cures Act, the labs might be visible to you at the same time that they become visible to me. However, I will not address the results until all of the results are back, so please be patient.  In the meantime, continue recommendations in your patient instructions, including avoidance measures (if applicable), until you hear from me. For mild symptoms you can take over the counter antihistamines such as Benadryl and monitor symptoms closely. If symptoms worsen or if you have severe symptoms including breathing issues, throat closure, significant swelling, whole body hives, severe diarrhea and vomiting, lightheadedness then inject epinephrine and seek immediate medical care afterwards. Food allergy action plan is in place.  Gastroesophageal reflux disease Stable.  Continue appropriate reflux lifestyle modifications. Continue omeprazole 59m one tablet in the morning. Nothing to eat or drink for 30 minutes afterwards.  Return in about 4 months (around 12/08/2021).  Meds ordered this encounter  Medications   EPINEPHrine 0.3 mg/0.3 mL IJ SOAJ injection    Sig: Inject 0.3 mg into the muscle as needed for anaphylaxis.    Dispense:  1 each    Refill:  2    May dispense generic/Mylan/Teva brand.    Lab Orders         Allergen Egg White         Allergen Profile, Shellfish         Allergen Profile, Food-Fish         IgE Nut Prof.  w/Component Rflx         Allergen, Strawberry, f44         Allergen, Banana f92         Tryptase      Diagnostics: Spirometry:  Tracings reviewed. Her effort: Good reproducible efforts. FVC: 2.21L FEV1: 1.96L, 115% predicted FEV1/FVC ratio: 89% Interpretation: Spirometry consistent with normal pattern.  Please see scanned spirometry results for details.  Medication List:  Current Outpatient Medications  Medication Sig Dispense Refill   albuterol (PROAIR HFA) 108 (90 Base) MCG/ACT inhaler Inhale 2 puffs into the lungs every 4 (four) hours as needed for wheezing or shortness of breath. 1 each 1   albuterol (PROVENTIL) (2.5 MG/3ML) 0.083% nebulizer solution Take 3 mLs (2.5 mg total) by nebulization as needed. 75 mL 1   aspirin EC 81 MG tablet Take 81 mg by mouth as needed.      Azelastine HCl 0.15 % SOLN May use azelastine nasal spray 1-2 sprays per nostril twice a day as needed for runny nose/drainage. 30 mL 5   Blood Glucose Monitoring Suppl (ONETOUCH VERIO) w/Device KIT by Does not apply route.     budesonide-formoterol (SYMBICORT) 160-4.5 MCG/ACT inhaler Inhale 2 puffs into the lungs in the morning and at bedtime. with spacer and rinse mouth afterwards. 1 each 5   cetirizine (ZYRTEC) 10 MG tablet Take 10 mg by mouth daily.  Dapsone (ACZONE) 7.5 % GEL Apply to face daily 60 g 2   Dapsone 5 % topical gel Apply to face daily as needed.     desonide (DESOWEN) 0.05 % ointment Apply to the scalp once daily as needed for itching.     diphenhydrAMINE (BENADRYL) 50 MG tablet Take 50 mg by mouth every 6 (six) hours as needed for itching.     donepezil (ARICEPT) 5 MG tablet Take by mouth.     doxycycline (VIBRAMYCIN) 100 MG capsule Take 100 mg by mouth daily.     ELDERBERRY PO Take 1 tablet by mouth daily.     empagliflozin (JARDIANCE) 10 MG TABS tablet Take 1 tablet by mouth daily.     EPINEPHrine 0.3 mg/0.3 mL IJ SOAJ injection Inject 0.3 mg into the muscle as needed for anaphylaxis. 1  each 2   fluocinonide ointment (LIDEX) 0.05 % APPLY OINTMENT TO SCALP 2 TO 3 TIMES PER WEEK **DO NOT APPLY TO FACE**     fluticasone (FLONASE) 50 MCG/ACT nasal spray Place 1-2 sprays into both nostrils daily. 16 g 5   folic acid (FOLVITE) 1 MG tablet Take 1 mg by mouth daily.     hydrOXYzine (ATARAX/VISTARIL) 25 MG tablet ONE TABLET AT BEDTIME AS NEEDED FOR ITCHING. 30 tablet 3   ibuprofen (ADVIL,MOTRIN) 800 MG tablet Take 1 tablet (800 mg total) by mouth every 8 (eight) hours as needed for mild pain. 30 tablet 0   ivermectin (STROMECTOL) 3 MG TABS tablet If exposed but no symptoms: Take 5 1/2 tablets per day 48 hours apart If experiencing symptoms: Take 5/12 tablets daily a minimum of 2 days, continue recovered (maximium 5 days) 30 tablet 0   levocetirizine (XYZAL) 5 MG tablet Take 1 tablet (5 mg total) by mouth daily as needed for allergies. 30 tablet 3   lisinopril-hydrochlorothiazide (PRINZIDE,ZESTORETIC) 10-12.5 MG tablet Take 0.5 tablets by mouth.      meloxicam (MOBIC) 7.5 MG tablet Take by mouth.     methocarbamol (ROBAXIN) 500 MG tablet Take 500 mg by mouth 3 (three) times daily.     metroNIDAZOLE (METROGEL) 1 % gel Apply to face daily for Rosacea 45 g 3   minoxidil (LONITEN) 2.5 MG tablet SMARTSIG:.5 Tablet(s) By Mouth Daily     NON FORMULARY      Olopatadine HCl 0.2 % SOLN Apply 1 drop to eye daily as needed (itchy/watery eyes). 7.5 mL 2   omeprazole (PRILOSEC) 20 MG capsule Take 1 capsule (20 mg total) by mouth in the morning. 30 capsule 5   potassium chloride (KLOR-CON) 10 MEQ tablet Take 10 mEq by mouth 2 (two) times daily.     pregabalin (LYRICA) 100 MG capsule Take by mouth.     triamcinolone acetonide (KENALOG-40) 40 MG/ML injection (RADIOLOGY ONLY) Inject into the articular space.     ZINC SULFATE ER PO Take by mouth.     finasteride (PROSCAR) 5 MG tablet Take one tablet by mouth daily (Patient not taking: Reported on 08/08/2021)     HYDROcodone-acetaminophen (NORCO/VICODIN)  5-325 MG tablet Take 1 tablet by mouth every 6 (six) hours as needed. (Patient not taking: Reported on 08/08/2021)     lisinopril-hydrochlorothiazide (ZESTORETIC) 10-12.5 MG tablet Take by mouth.     methotrexate 2.5 MG tablet  (Patient not taking: Reported on 08/08/2021)     traMADol (ULTRAM) 50 MG tablet TAKE 1 TABLET BY MOUTH EVERY 6 HOURS AS NEEDED FOR PAIN FOR UP TO 5 DAYS (Patient not taking: Reported on  08/08/2021)     No current facility-administered medications for this visit.   Allergies: Allergies  Allergen Reactions   Iodinated Diagnostic Agents Anaphylaxis    Cardiac Arrest Seizure   Other Hives   Shellfish Allergy Hives   Sulfasalazine Hives   Tetrofosmin [Technetium-49m Hives and Anaphylaxis   Banana Hives   Cat Hair Extract Other (See Comments)    Hives    Eggs Or Egg-Derived Products Nausea And Vomiting   Hemophilus B Polysaccharide Vaccine Other (See Comments)    OTHER  OTHER     Strawberry Extract Hives   Sulfamethoxazole Rash   Biaxin [Clarithromycin] Hives   Latex Hives   Peanuts [Peanut Oil] Hives   Pravastatin Hives   Shellfish-Derived Products Hives   Sulfa Antibiotics Hives and Rash   I reviewed her past medical history, social history, family history, and environmental history and no significant changes have been reported from her previous visit.  Review of Systems  Constitutional:  Negative for appetite change, chills, fever and unexpected weight change.  HENT:  Negative for congestion, postnasal drip and rhinorrhea.   Eyes:  Negative for itching.  Respiratory:  Negative for cough, chest tightness, shortness of breath and wheezing.   Gastrointestinal:  Negative for abdominal pain.  Skin:  Negative for rash.  Allergic/Immunologic: Positive for environmental allergies and food allergies.  Neurological:  Negative for headaches.   Objective: BP 100/62   Pulse 86   Temp 97.8 F (36.6 C) (Temporal)   Resp 16   Ht 5' 0.5" (1.537 m)   Wt 176 lb 8  oz (80.1 kg)   SpO2 97%   BMI 33.90 kg/m  Body mass index is 33.9 kg/m. Physical Exam Vitals and nursing note reviewed.  Constitutional:      Appearance: Normal appearance. She is well-developed.  HENT:     Head: Normocephalic and atraumatic.     Right Ear: Tympanic membrane and external ear normal.     Left Ear: Tympanic membrane and external ear normal.     Nose: Nose normal.     Mouth/Throat:     Mouth: Mucous membranes are moist.     Pharynx: Oropharynx is clear.  Eyes:     Conjunctiva/sclera: Conjunctivae normal.  Cardiovascular:     Rate and Rhythm: Normal rate and regular rhythm.     Heart sounds: Normal heart sounds. No murmur heard. Pulmonary:     Effort: Pulmonary effort is normal.     Breath sounds: Normal breath sounds. No wheezing, rhonchi or rales.  Musculoskeletal:     Cervical back: Neck supple.  Skin:    General: Skin is warm.     Findings: No rash.  Neurological:     Mental Status: She is alert and oriented to person, place, and time.  Psychiatric:        Behavior: Behavior normal.   Previous notes and tests were reviewed. The plan was reviewed with the patient/family, and all questions/concerned were addressed.  It was my pleasure to see HShamiyatoday and participate in her care. Please feel free to contact me with any questions or concerns.  Sincerely,  Gabrielle Alberts DO Allergy & Immunology  [facility]office: 3Lawteyoffice: [phone]

## 2021-08-08 ENCOUNTER — Other Ambulatory Visit: Payer: Self-pay

## 2021-08-08 ENCOUNTER — Ambulatory Visit (INDEPENDENT_AMBULATORY_CARE_PROVIDER_SITE_OTHER): Payer: Medicare Other | Admitting: Allergy

## 2021-08-08 ENCOUNTER — Encounter: Payer: Self-pay | Admitting: Allergy

## 2021-08-08 VITALS — BP 100/62 | HR 86 | Temp 97.8°F | Resp 16 | Ht 60.5 in | Wt 176.5 lb

## 2021-08-08 DIAGNOSIS — J3089 Other allergic rhinitis: Secondary | ICD-10-CM

## 2021-08-08 DIAGNOSIS — J309 Allergic rhinitis, unspecified: Secondary | ICD-10-CM

## 2021-08-08 DIAGNOSIS — J4540 Moderate persistent asthma, uncomplicated: Secondary | ICD-10-CM

## 2021-08-08 DIAGNOSIS — J302 Other seasonal allergic rhinitis: Secondary | ICD-10-CM | POA: Diagnosis not present

## 2021-08-08 DIAGNOSIS — H1013 Acute atopic conjunctivitis, bilateral: Secondary | ICD-10-CM

## 2021-08-08 DIAGNOSIS — K219 Gastro-esophageal reflux disease without esophagitis: Secondary | ICD-10-CM | POA: Diagnosis not present

## 2021-08-08 DIAGNOSIS — T7809XD Anaphylactic reaction due to other food products, subsequent encounter: Secondary | ICD-10-CM

## 2021-08-08 DIAGNOSIS — T7800XD Anaphylactic reaction due to unspecified food, subsequent encounter: Secondary | ICD-10-CM

## 2021-08-08 MED ORDER — EPINEPHRINE 0.3 MG/0.3ML IJ SOAJ: 0.3000 mg | INTRAMUSCULAR | 2 refills | Status: DC | PRN

## 2021-08-08 MED ORDER — ALBUTEROL SULFATE HFA 108 (90 BASE) MCG/ACT IN AERS: 2.0000 | INHALATION_SPRAY | RESPIRATORY_TRACT | 1 refills | Status: DC | PRN

## 2021-08-08 MED ORDER — FLUTICASONE PROPIONATE HFA 110 MCG/ACT IN AERO: 2.0000 | INHALATION_SPRAY | Freq: Two times a day (BID) | RESPIRATORY_TRACT | 2 refills | Status: DC

## 2021-08-08 NOTE — Assessment & Plan Note (Signed)
Well-controlled. . Act score 19.  . Today's spirometry was normal. . Daily controller medication(s): Symbicort 147mg 2 puffs twice a day with spacer and rinse mouth afterwards. . Prior to physical activity: May use albuterol rescue inhaler 2 puffs 5 to 15 minutes prior to strenuous physical activities. .Marland KitchenRescue medications: May use albuterol rescue inhaler 2 puffs or nebulizer every 4 to 6 hours as needed for shortness of breath, chest tightness, coughing, and wheezing. Monitor frequency of use.  . During upper respiratory infections/asthma flares: start Flovent 1126m 2 puffs twice a day with spacer and rinse mouth afterwards for 1-2 weeks until your breathing symptoms return to baseline.  . Get spirometry at next visit.

## 2021-08-08 NOTE — Addendum Note (Signed)
Addended by: Herbie Drape on: 08/08/2021 02:56 PM   Modules accepted: Orders

## 2021-08-08 NOTE — Addendum Note (Signed)
Addended by: Herbie Drape on: 08/08/2021 04:44 PM   Modules accepted: Orders

## 2021-08-08 NOTE — Patient Instructions (Addendum)
Moderate persistent asthma Normal breathing test today. Daily controller medication(s): Symbicort 11mg 2 puffs twice a day with spacer and rinse mouth afterwards. Prior to physical activity: May use albuterol rescue inhaler 2 puffs 5 to 15 minutes prior to strenuous physical activities. Rescue medications: May use albuterol rescue inhaler 2 puffs or nebulizer every 4 to 6 hours as needed for shortness of breath, chest tightness, coughing, and wheezing. Monitor frequency of use.  During upper respiratory infections/asthma flares: start Flovent 1177m 2 puffs twice a day with spacer and rinse mouth afterwards for 1-2 weeks until your breathing symptoms return to baseline.  Asthma control goals:  Full participation in all desired activities (may need albuterol before activity) Albuterol use two times or less a week on average (not counting use with activity) Cough interfering with sleep two times or less a month Oral steroids no more than once a year No hospitalizations  Seasonal and perennial allergic rhinitis Continue environmental control measures. Continue allergy injections - given today.  Use Flonase (fluticasone) nasal spray 1 spray per nostril twice a day as needed for nasal congestion.  Use azelastine nasal spray 1-2 sprays per nostril twice a day as needed for runny nose/drainage. ONLY USE the eye drops if needed. Olopatadine eye drops 0.2% once a day as needed for itchy/watery eyes. You may use over the counter refresh eye drops additionally if needed.  Nasal saline spray (i.e., Simply Saline) or nasal saline lavage (i.e., NeilMed) is recommended as needed and prior to medicated nasal sprays. May use over the counter antihistamines such as Zyrtec (cetirizine), Claritin (loratadine), Allegra (fexofenadine), or Xyzal (levocetirizine) daily as needed.  Food allergy Continue careful avoidance of egg, peanuts, shellfish, bananas, and strawberries. Get bloodwork and if negative will  schedule for a flounder fish challenge.  We are ordering labs, so please allow 1-2 weeks for the results to come back. With the newly implemented Cures Act, the labs might be visible to you at the same time that they become visible to me. However, I will not address the results until all of the results are back, so please be patient.  In the meantime, continue recommendations in your patient instructions, including avoidance measures (if applicable), until you hear from me. For mild symptoms you can take over the counter antihistamines such as Benadryl and monitor symptoms closely. If symptoms worsen or if you have severe symptoms including breathing issues, throat closure, significant swelling, whole body hives, severe diarrhea and vomiting, lightheadedness then inject epinephrine and seek immediate medical care afterwards. Food allergy action plan is in place.   GERD (gastroesophageal reflux disease) Continue appropriate reflux lifestyle modifications. Continue omeprazole '20mg'$  one tablet in the morning. Nothing to eat or drink for 30 minutes afterwards.   Flu shots: Get flu shot in our office in the fall every year.  Follow up in 4 months or sooner if needed.

## 2021-08-08 NOTE — Assessment & Plan Note (Signed)
Past history - on AIT (cat-tree-grass + mite-weed). Tried to stop before but restarted due to worsening symptoms.  Interim history - well controlled with below regimen.  Continue environmental control measures.  Continue allergy injections - given today.   Use Flonase (fluticasone) nasal spray 1 spray per nostril twice a day as needed for nasal congestion.   Use azelastine nasal spray 1-2 sprays per nostril twice a day as needed for runny nose/drainage.  ONLY USE the eye drops if needed.  Olopatadine eye drops 0.2% once a day as needed for itchy/watery eyes.  You may use over the counter refresh eye drops additionally if needed.   Nasal saline spray (i.e., Simply Saline) or nasal saline lavage (i.e., NeilMed) is recommended as needed and prior to medicated nasal sprays.  May use over the counter antihistamines such as Zyrtec (cetirizine), Claritin (loratadine), Allegra (fexofenadine), or Xyzal (levocetirizine) daily as needed.

## 2021-08-08 NOTE — Assessment & Plan Note (Signed)
Stable.   Continue appropriate reflux lifestyle modifications.  Continue omeprazole '20mg'$  one tablet in the morning. Nothing to eat or drink for 30 minutes afterwards.

## 2021-08-08 NOTE — Assessment & Plan Note (Signed)
Past history - 2018 blood work was negative to egg. Peanuts caused anaphylaxis requiring Epipen. Avoiding tree nuts. Failed egg challenge? Reaction to shrimp in the past. Had small bite of flounder with no issues. Interim history - interested in eating finned fish again only.   Unable to skin test today due to recent antihistamine intake.  Continue careful avoidance of egg, peanuts, shellfish, bananas, and strawberries. . Get bloodwork and if negative will schedule for a flounder fish challenge. Patient no interested in other food reintroduction.  o Will need prick testing prior to food challenge. o We are ordering labs, so please allow 1-2 weeks for the results to come back. o With the newly implemented Cures Act, the labs might be visible to you at the same time that they become visible to me. However, I will not address the results until all of the results are back, so please be patient.  o In the meantime, continue recommendations in your patient instructions, including avoidance measures (if applicable), until you hear from me.  For mild symptoms you can take over the counter antihistamines such as Benadryl and monitor symptoms closely. If symptoms worsen or if you have severe symptoms including breathing issues, throat closure, significant swelling, whole body hives, severe diarrhea and vomiting, lightheadedness then inject epinephrine and seek immediate medical care afterwards.  Food allergy action plan is in place.

## 2021-08-14 LAB — ALLERGEN PROFILE, SHELLFISH
Clam IgE: 0.13 kU/L — AB
F023-IgE Crab: 0.26 kU/L — AB
F080-IgE Lobster: 0.21 kU/L — AB
F290-IgE Oyster: <0.1 kU/L
Scallop IgE: <0.1 kU/L
Shrimp IgE: 0.68 kU/L — AB

## 2021-08-14 LAB — IGE NUT PROF. W/COMPONENT RFLX
F017-IgE Hazelnut (Filbert): <0.1 kU/L
F018-IgE Brazil Nut: <0.1 kU/L
F020-IgE Almond: <0.1 kU/L
F202-IgE Cashew Nut: <0.1 kU/L
F203-IgE Pistachio Nut: <0.1 kU/L
F256-IgE Walnut: <0.1 kU/L
Macadamia Nut, IgE: <0.1 kU/L
Peanut, IgE: <0.1 kU/L
Pecan Nut IgE: <0.1 kU/L

## 2021-08-14 LAB — ALLERGEN PROFILE, FOOD-FISH
Allergen Mackerel IgE: <0.1 kU/L
Allergen Salmon IgE: <0.1 kU/L
Allergen Trout IgE: <0.1 kU/L
Allergen Walley Pike IgE: <0.1 kU/L
Codfish IgE: <0.1 kU/L
Halibut IgE: <0.1 kU/L
Tuna: <0.1 kU/L

## 2021-08-14 LAB — TRYPTASE: Tryptase: 3.3 ug/L (ref 2.2–13.2)

## 2021-08-14 LAB — IGE EGG WHITE W/COMPONENT RFLX: F001-IgE Egg White: <0.1 kU/L

## 2021-08-14 LAB — ALLERGEN, STRAWBERRY, F44: Allergen Strawberry IgE: <0.1 kU/L

## 2021-08-14 LAB — ALLERGEN BANANA: Allergen Banana IgE: <0.1 kU/L

## 2021-09-05 ENCOUNTER — Ambulatory Visit (INDEPENDENT_AMBULATORY_CARE_PROVIDER_SITE_OTHER): Payer: Medicare Other

## 2021-09-05 ENCOUNTER — Other Ambulatory Visit: Payer: Self-pay

## 2021-09-05 DIAGNOSIS — J309 Allergic rhinitis, unspecified: Secondary | ICD-10-CM

## 2021-09-24 ENCOUNTER — Other Ambulatory Visit: Payer: Self-pay

## 2021-09-24 ENCOUNTER — Ambulatory Visit (INDEPENDENT_AMBULATORY_CARE_PROVIDER_SITE_OTHER): Payer: Medicare Other

## 2021-09-24 DIAGNOSIS — J309 Allergic rhinitis, unspecified: Secondary | ICD-10-CM

## 2021-10-01 ENCOUNTER — Ambulatory Visit (INDEPENDENT_AMBULATORY_CARE_PROVIDER_SITE_OTHER): Payer: Medicare Other

## 2021-10-01 ENCOUNTER — Other Ambulatory Visit: Payer: Self-pay

## 2021-10-01 DIAGNOSIS — J309 Allergic rhinitis, unspecified: Secondary | ICD-10-CM | POA: Diagnosis not present

## 2021-10-08 ENCOUNTER — Ambulatory Visit (INDEPENDENT_AMBULATORY_CARE_PROVIDER_SITE_OTHER): Payer: Medicare Other

## 2021-10-08 ENCOUNTER — Other Ambulatory Visit: Payer: Self-pay

## 2021-10-08 DIAGNOSIS — J309 Allergic rhinitis, unspecified: Secondary | ICD-10-CM

## 2021-10-15 ENCOUNTER — Ambulatory Visit (INDEPENDENT_AMBULATORY_CARE_PROVIDER_SITE_OTHER): Payer: Medicare Other

## 2021-10-15 ENCOUNTER — Other Ambulatory Visit: Payer: Self-pay

## 2021-10-15 DIAGNOSIS — J309 Allergic rhinitis, unspecified: Secondary | ICD-10-CM

## 2021-10-18 ENCOUNTER — Other Ambulatory Visit: Payer: Self-pay | Admitting: Allergy

## 2021-10-22 ENCOUNTER — Ambulatory Visit (INDEPENDENT_AMBULATORY_CARE_PROVIDER_SITE_OTHER): Payer: Medicare Other

## 2021-10-22 ENCOUNTER — Telehealth: Payer: Self-pay

## 2021-10-22 ENCOUNTER — Other Ambulatory Visit: Payer: Self-pay

## 2021-10-22 DIAGNOSIS — J309 Allergic rhinitis, unspecified: Secondary | ICD-10-CM | POA: Diagnosis not present

## 2021-10-22 NOTE — Telephone Encounter (Signed)
Patient has been getting the Egg Free Flu Vaccine in the [facility] office for the past few years and would like to get received it again this year. I called and left a message for patient to call the office to get that scheduled. Egg Free Flu Vaccine will be in the [facility] office on 10/24/2021.

## 2021-10-22 NOTE — Telephone Encounter (Signed)
Patient came in for her allergy injections and schedule her Egg Free Flu Vaccine for 10/24/2021.

## 2021-10-24 ENCOUNTER — Other Ambulatory Visit: Payer: Self-pay

## 2021-10-24 ENCOUNTER — Ambulatory Visit: Payer: Medicare Other

## 2021-10-24 DIAGNOSIS — Z23 Encounter for immunization: Secondary | ICD-10-CM

## 2021-12-03 ENCOUNTER — Other Ambulatory Visit: Payer: Self-pay

## 2021-12-03 ENCOUNTER — Ambulatory Visit (INDEPENDENT_AMBULATORY_CARE_PROVIDER_SITE_OTHER): Payer: Medicare Other

## 2021-12-03 DIAGNOSIS — J309 Allergic rhinitis, unspecified: Secondary | ICD-10-CM

## 2021-12-03 DIAGNOSIS — Z23 Encounter for immunization: Secondary | ICD-10-CM

## 2021-12-09 NOTE — Progress Notes (Signed)
Follow Up Note  RE: Gabrielle Shaw MRN: [id number] DOB: [birth year] Date of Office Visit: 12/10/2021  Referring provider: [name], MD Primary care provider: [name], MD  Chief Complaint: Asthma  History of Present Illness: I had the pleasure of seeing Gabrielle Shaw for a follow up visit at the [facility] on 12/10/2021. She is a 60 y.o. female, who is being followed for asthma, allergic rhinoconjunctivitis on AIT, food allergy, GERD. Her previous allergy office visit was on 08/08/2021 with Dr. [name]. Today is a regular follow up visit.  Moderate persistent asthma Currently on Symbicort 167mg 2 puffs twice a day. No worsening symptoms if misses a dose.  Denies any SOB, wheezing, chest tightness, nocturnal awakenings, ER/urgent care visits or prednisone use since the last visit. Some coughing about 1 month ago.   Seasonal and perennial allergic rhinoconjunctivitis No issues with the allergy injections and doing well on it. Takes Flonase prn and azelastine prn, pataday as needed. Few episodes of nosebleeds. Takes allegra daily and zyrtec only if needed.   Food allergy Currently avoiding eggs, peanuts, shellfish, bananas and strawberries. Tried flounder twice with no issues.  No Epipen use since the last visit.   Gastroesophageal reflux disease Takes omeprazole daily with good benefit. This works better than the famotidine.   Rheumatology put her on some new pills. Her current rheumatologist is moving and needs to establish care with a different physician.  Assessment and Plan: Gabrielle Shaw a 60y.o. female with: Moderate persistent asthma without complication Well-controlled with no flares.  Today's spirometry was normal. Daily controller medication(s): DECREASE Symbicort 1655m to ONE 1 puff twice a day with spacer and rinse mouth afterwards. If you notice symptoms then go back up to 2 puffs twice a day.  During upper respiratory infections/asthma  flares:  Start Flovent 11060m2 puffs twice a day with spacer and rinse mouth afterwards for 1-2 weeks until your breathing symptoms return to baseline.  Pretreat with albuterol 2 puffs or albuterol nebulizer.  If you need to use your albuterol nebulizer machine back to back within 15-30 minutes with no relief then please go to the ER/urgent care for further evaluation.  May use albuterol rescue inhaler 2 puffs every 4 to 6 hours as needed for shortness of breath, chest tightness, coughing, and wheezing. May use albuterol rescue inhaler 2 puffs 5 to 15 minutes prior to strenuous physical activities. Monitor frequency of use.  Get spirometry at next visit.  Seasonal and perennial allergic rhinoconjunctivitis Past history - on AIT (cat-tree-grass + mite-weed). Tried to stop before but restarted due to worsening symptoms.  Interim history - well controlled. Continue environmental control measures. Continue allergy injections. Use Flonase (fluticasone) nasal spray 1 spray per nostril twice a day as needed for nasal congestion.  Use azelastine nasal spray 1-2 sprays per nostril twice a day as needed for runny nose/drainage. ONLY USE the eye drops if needed. Olopatadine eye drops 0.2% once a day as needed for itchy/watery eyes. You may use over the counter refresh eye drops additionally if needed.  Nasal saline spray (i.e., Simply Saline) or nasal saline lavage (i.e., NeilMed) is recommended as needed and prior to medicated nasal sprays. May use over the counter antihistamines such as Zyrtec (cetirizine), Claritin (loratadine), Allegra (fexofenadine), or Xyzal (levocetirizine) daily as needed.  Anaphylactic shock due to adverse food reaction Past history - 2018 blood work was negative to egg. Peanuts caused anaphylaxis requiring Epipen. Avoiding tree nuts. Failed egg  challenge? Reaction to shrimp in the past. Interim history - 2022 Blood work was negative to egg, fish, peanuts, tree nuts, strawberry,  banana. Borderline positive to shellfish. Tried flounder twice at home with no issues.  Continue careful avoidance of egg, peanuts, tree nuts, shellfish, bananas, and strawberries. Okay to eat flounder.  For mild symptoms you can take over the counter antihistamines such as Benadryl and monitor symptoms closely. If symptoms worsen or if you have severe symptoms including breathing issues, throat closure, significant swelling, whole body hives, severe diarrhea and vomiting, lightheadedness then inject epinephrine and seek immediate medical care afterwards. Food allergy action plan in place.  Gastroesophageal reflux disease Continue appropriate reflux lifestyle modifications. Continue omeprazole 34m one tablet in the morning. Nothing to eat or drink for 30 minutes afterwards.  Return in about 4 months (around 04/10/2022).  Meds ordered this encounter  Medications   Olopatadine HCl 0.2 % SOLN    Sig: Apply 1 drop to eye daily as needed (itchy/watery eyes).    Dispense:  7.5 mL    Refill:  2    Please consider 90 day supplies to promote better adherence   omeprazole (PRILOSEC) 20 MG capsule    Sig: Take 1 capsule (20 mg total) by mouth daily.    Dispense:  90 capsule    Refill:  1   Lab Orders  No laboratory test(s) ordered today    Diagnostics: Spirometry:  Tracings reviewed. Her effort: Good reproducible efforts. FVC: 2.20L FEV1: 1.91L, 148% predicted FEV1/FVC ratio: 87% Interpretation: Spirometry consistent with normal pattern.  Please see scanned spirometry results for details.  Medication List:  Current Outpatient Medications  Medication Sig Dispense Refill   albuterol (PROAIR HFA) 108 (90 Base) MCG/ACT inhaler Inhale 2 puffs into the lungs every 4 (four) hours as needed for wheezing or shortness of breath. 1 each 1   albuterol (PROVENTIL) (2.5 MG/3ML) 0.083% nebulizer solution Take 3 mLs (2.5 mg total) by nebulization as needed. 75 mL 1   aspirin EC 81 MG tablet Take 81 mg  by mouth as needed.      Azelastine HCl 0.15 % SOLN May use azelastine nasal spray 1-2 sprays per nostril twice a day as needed for runny nose/drainage. 30 mL 5   Blood Glucose Monitoring Suppl (ONETOUCH VERIO) w/Device KIT by Does not apply route.     budesonide-formoterol (SYMBICORT) 160-4.5 MCG/ACT inhaler Inhale 2 puffs into the lungs in the morning and at bedtime. with spacer and rinse mouth afterwards. 1 each 5   cetirizine (ZYRTEC) 10 MG tablet Take 10 mg by mouth daily.     Dapsone (ACZONE) 7.5 % GEL Apply to face daily 60 g 2   Dapsone 5 % topical gel Apply to face daily as needed.     desonide (DESOWEN) 0.05 % ointment Apply to the scalp once daily as needed for itching.     diphenhydrAMINE (BENADRYL) 50 MG tablet Take 50 mg by mouth every 6 (six) hours as needed for itching.     donepezil (ARICEPT) 5 MG tablet Take by mouth.     doxycycline (VIBRAMYCIN) 100 MG capsule Take 100 mg by mouth daily.     ELDERBERRY PO Take 1 tablet by mouth daily.     empagliflozin (JARDIANCE) 10 MG TABS tablet Take 1 tablet by mouth daily.     EPINEPHrine 0.3 mg/0.3 mL IJ SOAJ injection Inject 0.3 mg into the muscle as needed for anaphylaxis. 1 each 2   finasteride (PROSCAR) 5 MG tablet  fluocinonide ointment (LIDEX) 0.05 % APPLY OINTMENT TO SCALP 2 TO 3 TIMES PER WEEK **DO NOT APPLY TO FACE**     fluticasone (FLONASE) 50 MCG/ACT nasal spray Place 1-2 sprays into both nostrils daily. 16 g 5   fluticasone (FLOVENT HFA) 110 MCG/ACT inhaler Inhale 2 puffs into the lungs 2 (two) times daily. During asthma flares. 12 g 2   folic acid (FOLVITE) 1 MG tablet Take 1 mg by mouth daily.     HYDROcodone-acetaminophen (NORCO/VICODIN) 5-325 MG tablet Take 1 tablet by mouth every 6 (six) hours as needed.     hydrOXYzine (ATARAX/VISTARIL) 25 MG tablet ONE TABLET AT BEDTIME AS NEEDED FOR ITCHING. 30 tablet 3   ibuprofen (ADVIL,MOTRIN) 800 MG tablet Take 1 tablet (800 mg total) by mouth every 8 (eight) hours as needed  for mild pain. 30 tablet 0   ivermectin (STROMECTOL) 3 MG TABS tablet If exposed but no symptoms: Take 5 1/2 tablets per day 48 hours apart If experiencing symptoms: Take 5/12 tablets daily a minimum of 2 days, continue recovered (maximium 5 days) 30 tablet 0   levocetirizine (XYZAL) 5 MG tablet Take 1 tablet (5 mg total) by mouth daily as needed for allergies. 30 tablet 3   lisinopril-hydrochlorothiazide (PRINZIDE,ZESTORETIC) 10-12.5 MG tablet Take 0.5 tablets by mouth.      meloxicam (MOBIC) 7.5 MG tablet Take by mouth.     methocarbamol (ROBAXIN) 500 MG tablet Take 500 mg by mouth 3 (three) times daily.     methotrexate 2.5 MG tablet      metroNIDAZOLE (METROGEL) 1 % gel Apply to face daily for Rosacea 45 g 3   minoxidil (LONITEN) 2.5 MG tablet SMARTSIG:.5 Tablet(s) By Mouth Daily     NON FORMULARY      omeprazole (PRILOSEC) 20 MG capsule Take 1 capsule (20 mg total) by mouth daily. 90 capsule 1   potassium chloride (KLOR-CON) 10 MEQ tablet Take 10 mEq by mouth 2 (two) times daily.     pregabalin (LYRICA) 100 MG capsule Take by mouth.     triamcinolone acetonide (KENALOG-40) 40 MG/ML injection (RADIOLOGY ONLY) Inject into the articular space.     ZINC SULFATE ER PO Take by mouth.     lisinopril-hydrochlorothiazide (ZESTORETIC) 10-12.5 MG tablet Take by mouth.     Olopatadine HCl 0.2 % SOLN Apply 1 drop to eye daily as needed (itchy/watery eyes). 7.5 mL 2   traMADol (ULTRAM) 50 MG tablet TAKE 1 TABLET BY MOUTH EVERY 6 HOURS AS NEEDED FOR PAIN FOR UP TO 5 DAYS (Patient not taking: Reported on 08/08/2021)     No current facility-administered medications for this visit.   Allergies: Allergies  Allergen Reactions   Iodinated Diagnostic Agents Anaphylaxis    Cardiac Arrest Seizure   Other Hives   Shellfish Allergy Hives   Sulfasalazine Hives   Tetrofosmin [Technetium-25m Hives and Anaphylaxis   Banana Hives   Cat Hair Extract Other (See Comments)    Hives    Eggs Or Egg-Derived  Products Nausea And Vomiting   Hemophilus B Polysaccharide Vaccine Other (See Comments)    OTHER  OTHER     Strawberry Extract Hives   Sulfamethoxazole Rash   Biaxin [Clarithromycin] Hives   Latex Hives   Peanuts [Peanut Oil] Hives   Pravastatin Hives   Shellfish-Derived Products Hives   Sulfa Antibiotics Hives and Rash   I reviewed her past medical history, social history, family history, and environmental history and no significant changes have been reported from her  previous visit.  Review of Systems  Constitutional:  Negative for appetite change, chills, fever and unexpected weight change.  HENT:  Negative for congestion, postnasal drip and rhinorrhea.   Eyes:  Negative for itching.  Respiratory:  Negative for cough, chest tightness, shortness of breath and wheezing.   Gastrointestinal:  Negative for abdominal pain.  Skin:  Negative for rash.  Allergic/Immunologic: Positive for environmental allergies and food allergies.  Neurological:  Negative for headaches.   Objective: BP 130/78    Pulse 74    Temp 98.3 F (36.8 C) (Temporal)    Resp 16    SpO2 97%  There is no height or weight on file to calculate BMI. Physical Exam Vitals and nursing note reviewed.  Constitutional:      Appearance: Normal appearance. She is well-developed.  HENT:     Head: Normocephalic and atraumatic.     Right Ear: Tympanic membrane and external ear normal.     Left Ear: Tympanic membrane and external ear normal.     Nose: Nose normal.     Mouth/Throat:     Mouth: Mucous membranes are moist.     Pharynx: Oropharynx is clear.  Eyes:     Conjunctiva/sclera: Conjunctivae normal.  Cardiovascular:     Rate and Rhythm: Normal rate and regular rhythm.     Heart sounds: Normal heart sounds. No murmur heard. Pulmonary:     Effort: Pulmonary effort is normal.     Breath sounds: Normal breath sounds. No wheezing, rhonchi or rales.  Musculoskeletal:     Cervical back: Neck supple.  Skin:     General: Skin is warm.     Findings: No rash.  Neurological:     Mental Status: She is alert and oriented to person, place, and time.  Psychiatric:        Behavior: Behavior normal.  Previous notes and tests were reviewed. The plan was reviewed with the patient/family, and all questions/concerned were addressed.  It was my pleasure to see Gabrielle Shaw today and participate in her care. Please feel free to contact me with any questions or concerns.  Sincerely,  Rexene Alberts, DO Allergy & Immunology  [facility] office: [place] office: [phone]

## 2021-12-10 ENCOUNTER — Encounter: Payer: Self-pay | Admitting: Allergy

## 2021-12-10 ENCOUNTER — Other Ambulatory Visit: Payer: Self-pay

## 2021-12-10 ENCOUNTER — Ambulatory Visit (INDEPENDENT_AMBULATORY_CARE_PROVIDER_SITE_OTHER): Payer: Medicare Other | Admitting: Allergy

## 2021-12-10 VITALS — BP 130/78 | HR 74 | Temp 98.3°F | Resp 16

## 2021-12-10 DIAGNOSIS — H1010 Acute atopic conjunctivitis, unspecified eye: Secondary | ICD-10-CM

## 2021-12-10 DIAGNOSIS — K219 Gastro-esophageal reflux disease without esophagitis: Secondary | ICD-10-CM

## 2021-12-10 DIAGNOSIS — J302 Other seasonal allergic rhinitis: Secondary | ICD-10-CM

## 2021-12-10 DIAGNOSIS — J4540 Moderate persistent asthma, uncomplicated: Secondary | ICD-10-CM

## 2021-12-10 DIAGNOSIS — T7800XD Anaphylactic reaction due to unspecified food, subsequent encounter: Secondary | ICD-10-CM

## 2021-12-10 DIAGNOSIS — J3089 Other allergic rhinitis: Secondary | ICD-10-CM

## 2021-12-10 MED ORDER — OLOPATADINE HCL 0.2 % OP SOLN: 1.0000 [drp] | Freq: Every day | OPHTHALMIC | 2 refills | Status: DC | PRN

## 2021-12-10 MED ORDER — OMEPRAZOLE 20 MG PO CPDR: 20.0000 mg | DELAYED_RELEASE_CAPSULE | Freq: Every day | ORAL | 1 refills | Status: DC

## 2021-12-10 NOTE — Assessment & Plan Note (Signed)
·   Continue appropriate reflux lifestyle modifications.  Continue omeprazole 20mg  one tablet in the morning. Nothing to eat or drink for 30 minutes afterwards.

## 2021-12-10 NOTE — Patient Instructions (Addendum)
Moderate persistent asthma Normal breathing test today. Daily controller medication(s): DECREASE Symbicort 123mcg to ONE 1 puff twice a day with spacer and rinse mouth afterwards. If you notice symptoms then go back up to 2 puffs twice a day.  During upper respiratory infections/asthma flares:  Start Flovent 11mcg 2 puffs twice a day with spacer and rinse mouth afterwards for 1-2 weeks until your breathing symptoms return to baseline.  Pretreat with albuterol 2 puffs or albuterol nebulizer.  If you need to use your albuterol nebulizer machine back to back within 15-30 minutes with no relief then please go to the ER/urgent care for further evaluation.  May use albuterol rescue inhaler 2 puffs every 4 to 6 hours as needed for shortness of breath, chest tightness, coughing, and wheezing. May use albuterol rescue inhaler 2 puffs 5 to 15 minutes prior to strenuous physical activities. Monitor frequency of use.  Asthma control goals:  Full participation in all desired activities (may need albuterol before activity) Albuterol use two times or less a week on average (not counting use with activity) Cough interfering with sleep two times or less a month Oral steroids no more than once a year No hospitalizations   Seasonal and perennial allergic rhinitis Continue environmental control measures. Continue allergy injections. Use Flonase (fluticasone) nasal spray 1 spray per nostril twice a day as needed for nasal congestion.  Use azelastine nasal spray 1-2 sprays per nostril twice a day as needed for runny nose/drainage. ONLY USE the eye drops if needed. Olopatadine eye drops 0.2% once a day as needed for itchy/watery eyes. You may use over the counter refresh eye drops additionally if needed.  Nasal saline spray (i.e., Simply Saline) or nasal saline lavage (i.e., NeilMed) is recommended as needed and prior to medicated nasal sprays. May use over the counter antihistamines such as Zyrtec (cetirizine),  Claritin (loratadine), Allegra (fexofenadine), or Xyzal (levocetirizine) daily as needed.  Food allergy Continue careful avoidance of egg, peanuts, tree nuts, shellfish, bananas, and strawberries. Okay to eat flounder.  For mild symptoms you can take over the counter antihistamines such as Benadryl and monitor symptoms closely. If symptoms worsen or if you have severe symptoms including breathing issues, throat closure, significant swelling, whole body hives, severe diarrhea and vomiting, lightheadedness then inject epinephrine and seek immediate medical care afterwards. Food allergy action plan in place.   GERD (gastroesophageal reflux disease) Continue appropriate reflux lifestyle modifications. Continue omeprazole 20mg  one tablet in the morning. Nothing to eat or drink for 30 minutes afterwards.   Follow up in 4 months or sooner if needed.

## 2021-12-10 NOTE — Assessment & Plan Note (Signed)
Well-controlled with no flares.   Today's spirometry was normal.  Daily controller medication(s): DECREASE Symbicort 170mcg to ONE 1 puff twice a day with spacer and rinse mouth afterwards. o If you notice symptoms then go back up to 2 puffs twice a day.   During upper respiratory infections/asthma flares:  o Start Flovent 168mcg 2 puffs twice a day with spacer and rinse mouth afterwards for 1-2 weeks until your breathing symptoms return to baseline.  o Pretreat with albuterol 2 puffs or albuterol nebulizer.  o If you need to use your albuterol nebulizer machine back to back within 15-30 minutes with no relief then please go to the ER/urgent care for further evaluation.   May use albuterol rescue inhaler 2 puffs every 4 to 6 hours as needed for shortness of breath, chest tightness, coughing, and wheezing. May use albuterol rescue inhaler 2 puffs 5 to 15 minutes prior to strenuous physical activities. Monitor frequency of use.   Get spirometry at next visit.

## 2021-12-10 NOTE — Assessment & Plan Note (Signed)
Past history - on AIT (cat-tree-grass + mite-weed). Tried to stop before but restarted due to worsening symptoms.  Interim history - well controlled.  Continue environmental control measures.  Continue allergy injections.  Use Flonase (fluticasone) nasal spray 1 spray per nostril twice a day as needed for nasal congestion.   Use azelastine nasal spray 1-2 sprays per nostril twice a day as needed for runny nose/drainage.  ONLY USE the eye drops if needed.  Olopatadine eye drops 0.2% once a day as needed for itchy/watery eyes.  You may use over the counter refresh eye drops additionally if needed.   Nasal saline spray (i.e., Simply Saline) or nasal saline lavage (i.e., NeilMed) is recommended as needed and prior to medicated nasal sprays.  May use over the counter antihistamines such as Zyrtec (cetirizine), Claritin (loratadine), Allegra (fexofenadine), or Xyzal (levocetirizine) daily as needed.

## 2021-12-10 NOTE — Assessment & Plan Note (Signed)
Past history - 2018 blood work was negative to egg. Peanuts caused anaphylaxis requiring Epipen. Avoiding tree nuts. Failed egg challenge? Reaction to shrimp in the past. Interim history - 2022 Blood work was negative to egg, fish, peanuts, tree nuts, strawberry, banana. Borderline positive to shellfish. Tried flounder twice at home with no issues.   Continue careful avoidance of egg, peanuts, tree nuts, shellfish, bananas, and strawberries.  Okay to eat flounder.   For mild symptoms you can take over the counter antihistamines such as Benadryl and monitor symptoms closely. If symptoms worsen or if you have severe symptoms including breathing issues, throat closure, significant swelling, whole body hives, severe diarrhea and vomiting, lightheadedness then inject epinephrine and seek immediate medical care afterwards.  Food allergy action plan in place.

## 2021-12-24 ENCOUNTER — Ambulatory Visit (INDEPENDENT_AMBULATORY_CARE_PROVIDER_SITE_OTHER): Payer: Commercial Managed Care - HMO

## 2021-12-24 ENCOUNTER — Other Ambulatory Visit: Payer: Self-pay

## 2021-12-24 DIAGNOSIS — J309 Allergic rhinitis, unspecified: Secondary | ICD-10-CM | POA: Diagnosis not present

## 2022-01-28 ENCOUNTER — Other Ambulatory Visit: Payer: Self-pay

## 2022-01-28 ENCOUNTER — Ambulatory Visit (INDEPENDENT_AMBULATORY_CARE_PROVIDER_SITE_OTHER): Payer: Medicare Other

## 2022-01-28 DIAGNOSIS — J309 Allergic rhinitis, unspecified: Secondary | ICD-10-CM

## 2022-02-17 DIAGNOSIS — J301 Allergic rhinitis due to pollen: Secondary | ICD-10-CM | POA: Diagnosis not present

## 2022-02-17 NOTE — Progress Notes (Signed)
VIALS EXP 02-17-23

## 2022-02-18 DIAGNOSIS — J3089 Other allergic rhinitis: Secondary | ICD-10-CM | POA: Diagnosis not present

## 2022-02-20 ENCOUNTER — Ambulatory Visit (INDEPENDENT_AMBULATORY_CARE_PROVIDER_SITE_OTHER): Payer: Medicare Other

## 2022-02-20 ENCOUNTER — Other Ambulatory Visit: Payer: Self-pay

## 2022-02-20 DIAGNOSIS — J309 Allergic rhinitis, unspecified: Secondary | ICD-10-CM

## 2022-03-25 ENCOUNTER — Ambulatory Visit (INDEPENDENT_AMBULATORY_CARE_PROVIDER_SITE_OTHER): Payer: Medicare Other

## 2022-03-25 DIAGNOSIS — J309 Allergic rhinitis, unspecified: Secondary | ICD-10-CM | POA: Diagnosis not present

## 2022-04-09 NOTE — Progress Notes (Signed)
? ?Follow Up Note ? ?RE: Gabrielle Shaw MRN: [id number] DOB: 1961-09-12 ?Date of Office Visit: 04/10/2022 ? ?Referring provider: [name], MD ?Primary care provider: [name], MD ? ?Chief Complaint: Allergic Rhinitis  (Sneezing, watery eyes ) and Asthma (No issues ) ? ?History of Present Illness: ?I had the pleasure of seeing Gabrielle Shaw for a follow up visit at the [facility] on 04/10/2022. She is a 61 y.o. female, who is being followed for asthma, allergic rhinoconjunctivitis on AIT, food allergy and GERD. Her previous allergy office visit was on 12/10/2021 with Dr. [name]. Today is a regular follow up visit. ? ?Moderate persistent asthma  ?Denies any SOB, coughing, wheezing, chest tightness, nocturnal awakenings, ER/urgent care visits or prednisone use since the last visit. ?No albuterol use since the last visit. ? ?Decreased Symbicort 187mg to 1 puff BID with no worsening symptoms.  ? ?Seasonal and perennial allergic rhinoconjunctivitis ?Currently on AIT every 4 weeks with good benefit. ?Having some sneezing and watery eyes. ?Using olopatadine as needed with good benefit. ?Uses Flonase prn, azelastine prn with good benefit. No nosebleeds.  ?Takes allegra daily. ? ?Food allergy ?No issues with flounder.  ?Avoiding egg, peanuts, tree nuts, shellfish, bananas, and strawberries. ?No reactions.  ? ?Gastroesophageal reflux disease ?Controlled with omeprazole 230mand takes famotidine prn.  ? ?Assessment and Plan: ?HaSharees a [age].o. female with: ?Moderate persistent asthma without complication ?No worsening symptoms with lower Symbicort dosing.  ?Today's spirometry was normal. ?Daily controller medication(s): DECREASE Symbicort 16036m1 puff to ONCE a day with spacer and rinse mouth afterwards. ?If you notice symptoms then go back up to 1 puff twice a day.  ?During upper respiratory infections/asthma flares:  ?Increase Symbicort 160m10mo 2 puffs twice a day with spacer and rinse mouth  afterwards for 1-2 weeks until your breathing symptoms return to baseline.  ?Pretreat with albuterol 2 puffs or albuterol nebulizer.  ?If you need to use your albuterol nebulizer machine back to back within 15-30 minutes with no relief then please go to the ER/urgent care for further evaluation.  ?May use albuterol rescue inhaler 2 puffs every 4 to 6 hours as needed for shortness of breath, chest tightness, coughing, and wheezing. May use albuterol rescue inhaler 2 puffs 5 to 15 minutes prior to strenuous physical activities. Monitor frequency of use.  ?Get spirometry at next visit. ? ?Seasonal and perennial allergic rhinoconjunctivitis ?Past history - on AIT (cat-tree-grass + mite-weed). Tried to stop before but restarted due to worsening symptoms.  ?Interim history - sneezing and watery eyes lately. ?Continue environmental control measures. ?Continue allergy injections. ?Use Flonase (fluticasone) nasal spray 1 spray per nostril twice a day as needed for nasal congestion.  ?Use azelastine nasal spray 1-2 sprays per nostril twice a day as needed for runny nose/drainage. ?ONLY USE the eye drops if needed. Sample given. ?Olopatadine eye drops 0.2% once a day as needed for itchy/watery eyes. ?You may use over the counter refresh eye drops additionally if needed.  ?Nasal saline spray (i.e., Simply Saline) or nasal saline lavage (i.e., NeilMed) is recommended as needed and prior to medicated nasal sprays. ?May use over the counter antihistamines such as Zyrtec (cetirizine), Claritin (loratadine), Allegra (fexofenadine), or Xyzal (levocetirizine) daily as needed. ? ?Anaphylactic reaction due to other food products, subsequent encounter ?Past history - 2018 blood work was negative to egg. Peanuts caused anaphylaxis requiring Epipen. Avoiding tree nuts. Failed egg challenge? Reaction to shrimp in the past. 2022 Blood  work was negative to egg, fish, peanuts, tree nuts, strawberry, banana. Borderline positive to  shellfish. ?Interim history - tolerates finned fish. No reactions.  ?Continue careful avoidance of egg, peanuts, tree nuts, shellfish, bananas, and strawberries. ?Okay to eat flounder.  ?For mild symptoms you can take over the counter antihistamines such as Benadryl and monitor symptoms closely. If symptoms worsen or if you have severe symptoms including breathing issues, throat closure, significant swelling, whole body hives, severe diarrhea and vomiting, lightheadedness then inject epinephrine and seek immediate medical care afterwards. ?Food allergy action plan in place. ? ?Gastroesophageal reflux disease ?Controlled.  ?Continue appropriate reflux lifestyle modifications. ?Continue omeprazole 90m one tablet in the morning. Nothing to eat or drink for 30 minutes afterwards.  ?May take famotidine 279mdaily as needed.  ? ?Return in about 4 months (around 08/10/2022). ? ?Meds ordered this encounter  ?Medications  ? omeprazole (PRILOSEC) 20 MG capsule  ?  Sig: Take 1 capsule (20 mg total) by mouth daily.  ?  Dispense:  90 capsule  ?  Refill:  2  ? Olopatadine HCl 0.2 % SOLN  ?  Sig: Apply 1 drop to eye daily as needed (itchy/watery eyes).  ?  Dispense:  7.5 mL  ?  Refill:  1  ? famotidine (PEPCID) 20 MG tablet  ?  Sig: Take 1 tablet (20 mg total) by mouth daily as needed for heartburn or indigestion.  ?  Dispense:  30 tablet  ?  Refill:  3  ? budesonide-formoterol (SYMBICORT) 160-4.5 MCG/ACT inhaler  ?  Sig: Inhale 2 puffs into the lungs in the morning and at bedtime. with spacer and rinse mouth afterwards.  ?  Dispense:  1 each  ?  Refill:  3  ? albuterol (VENTOLIN HFA) 108 (90 Base) MCG/ACT inhaler  ?  Sig: Inhale 2 puffs into the lungs every 4 (four) hours as needed for wheezing or shortness of breath (coughing fits).  ?  Dispense:  18 g  ?  Refill:  1  ? ?Lab Orders  ?No laboratory test(s) ordered today  ? ? ?Diagnostics: ?Spirometry:  ?Tracings reviewed. Her effort: Good reproducible efforts. ?FVC: 2.13L ?FEV1:  1.87L, 105% predicted ?FEV1/FVC ratio: 88% ?Interpretation: Spirometry consistent with normal pattern.  ?Please see scanned spirometry results for details. ? ?Medication List:  ?Current Outpatient Medications  ?Medication Sig Dispense Refill  ? albuterol (PROVENTIL) (2.5 MG/3ML) 0.083% nebulizer solution Take 3 mLs (2.5 mg total) by nebulization as needed. 75 mL 1  ? albuterol (VENTOLIN HFA) 108 (90 Base) MCG/ACT inhaler Inhale 2 puffs into the lungs every 4 (four) hours as needed for wheezing or shortness of breath (coughing fits). 18 g 1  ? aspirin EC 81 MG tablet Take 81 mg by mouth as needed.     ? Azelastine HCl 0.15 % SOLN May use azelastine nasal spray 1-2 sprays per nostril twice a day as needed for runny nose/drainage. 30 mL 5  ? Blood Glucose Monitoring Suppl (ONETOUCH VERIO) w/Device KIT by Does not apply route.    ? cetirizine (ZYRTEC) 10 MG tablet Take 10 mg by mouth daily.    ? Dapsone (ACZONE) 7.5 % GEL Apply to face daily 60 g 2  ? Dapsone 5 % topical gel Apply to face daily as needed.    ? desonide (DESOWEN) 0.05 % ointment Apply to the scalp once daily as needed for itching.    ? diphenhydrAMINE (BENADRYL) 50 MG tablet Take 50 mg by mouth every 6 (six) hours as  needed for itching.    ? donepezil (ARICEPT) 5 MG tablet Take by mouth.    ? doxycycline (VIBRAMYCIN) 100 MG capsule Take 100 mg by mouth daily.    ? ELDERBERRY PO Take 1 tablet by mouth daily.    ? empagliflozin (JARDIANCE) 10 MG TABS tablet Take 1 tablet by mouth daily.    ? EPINEPHrine 0.3 mg/0.3 mL IJ SOAJ injection Inject 0.3 mg into the muscle as needed for anaphylaxis. 1 each 2  ? famotidine (PEPCID) 20 MG tablet Take 1 tablet (20 mg total) by mouth daily as needed for heartburn or indigestion. 30 tablet 3  ? finasteride (PROSCAR) 5 MG tablet     ? fluocinonide ointment (LIDEX) 0.05 % APPLY OINTMENT TO SCALP 2 TO 3 TIMES PER WEEK **DO NOT APPLY TO FACE**    ? fluticasone (FLONASE) 50 MCG/ACT nasal spray Place 1-2 sprays into both  nostrils daily. 16 g 5  ? folic acid (FOLVITE) 1 MG tablet Take 1 mg by mouth daily.    ? HYDROcodone-acetaminophen (NORCO/VICODIN) 5-325 MG tablet Take 1 tablet by mouth every 6 (six) hours as needed.    ? hydrOXYzin

## 2022-04-10 ENCOUNTER — Ambulatory Visit (INDEPENDENT_AMBULATORY_CARE_PROVIDER_SITE_OTHER): Payer: Medicare Other | Admitting: Allergy

## 2022-04-10 ENCOUNTER — Encounter: Payer: Self-pay | Admitting: Allergy

## 2022-04-10 VITALS — BP 126/86 | HR 85 | Temp 97.9°F | Resp 18 | Ht 61.0 in | Wt 181.4 lb

## 2022-04-10 DIAGNOSIS — T7809XD Anaphylactic reaction due to other food products, subsequent encounter: Secondary | ICD-10-CM

## 2022-04-10 DIAGNOSIS — J4540 Moderate persistent asthma, uncomplicated: Secondary | ICD-10-CM

## 2022-04-10 DIAGNOSIS — J302 Other seasonal allergic rhinitis: Secondary | ICD-10-CM

## 2022-04-10 DIAGNOSIS — J3089 Other allergic rhinitis: Secondary | ICD-10-CM

## 2022-04-10 DIAGNOSIS — H1013 Acute atopic conjunctivitis, bilateral: Secondary | ICD-10-CM

## 2022-04-10 DIAGNOSIS — K219 Gastro-esophageal reflux disease without esophagitis: Secondary | ICD-10-CM

## 2022-04-10 MED ORDER — BUDESONIDE-FORMOTEROL FUMARATE 160-4.5 MCG/ACT IN AERO: 2.0000 | INHALATION_SPRAY | Freq: Two times a day (BID) | RESPIRATORY_TRACT | 3 refills | Status: DC

## 2022-04-10 MED ORDER — OLOPATADINE HCL 0.2 % OP SOLN: 1.0000 [drp] | Freq: Every day | OPHTHALMIC | 1 refills | Status: DC | PRN

## 2022-04-10 MED ORDER — OMEPRAZOLE 20 MG PO CPDR: 20.0000 mg | DELAYED_RELEASE_CAPSULE | Freq: Every day | ORAL | 2 refills | Status: DC

## 2022-04-10 MED ORDER — FAMOTIDINE 20 MG PO TABS: 20.0000 mg | ORAL_TABLET | Freq: Every day | ORAL | 3 refills | Status: DC | PRN

## 2022-04-10 MED ORDER — ALBUTEROL SULFATE HFA 108 (90 BASE) MCG/ACT IN AERS: 2.0000 | INHALATION_SPRAY | RESPIRATORY_TRACT | 1 refills | Status: DC | PRN

## 2022-04-10 NOTE — Assessment & Plan Note (Signed)
No worsening symptoms with lower Symbicort dosing.  ?? Today's spirometry was normal. ?? Daily controller medication(s): DECREASE Symbicort 130mg 1 puff to ONCE a day with spacer and rinse mouth afterwards. ?o If you notice symptoms then go back up to 1 puff twice a day.  ?? During upper respiratory infections/asthma flares:  ?o Increase Symbicort 1650m to 2 puffs twice a day with spacer and rinse mouth afterwards for 1-2 weeks until your breathing symptoms return to baseline.  ?o Pretreat with albuterol 2 puffs or albuterol nebulizer.  ?o If you need to use your albuterol nebulizer machine back to back within 15-30 minutes with no relief then please go to the ER/urgent care for further evaluation.  ?? May use albuterol rescue inhaler 2 puffs every 4 to 6 hours as needed for shortness of breath, chest tightness, coughing, and wheezing. May use albuterol rescue inhaler 2 puffs 5 to 15 minutes prior to strenuous physical activities. Monitor frequency of use.  ?? Get spirometry at next visit. ?

## 2022-04-10 NOTE — Assessment & Plan Note (Signed)
Past history - on AIT (cat-tree-grass + mite-weed). Tried to stop before but restarted due to worsening symptoms.  ?Interim history - sneezing and watery eyes lately. ?? Continue environmental control measures. ?? Continue allergy injections. ?? Use Flonase (fluticasone) nasal spray 1 spray per nostril twice a day as needed for nasal congestion.  ?? Use azelastine nasal spray 1-2 sprays per nostril twice a day as needed for runny nose/drainage. ?? ONLY USE the eye drops if needed. Sample given. ?? Olopatadine eye drops 0.2% once a day as needed for itchy/watery eyes. ?? You may use over the counter refresh eye drops additionally if needed.  ?? Nasal saline spray (i.e., Simply Saline) or nasal saline lavage (i.e., NeilMed) is recommended as needed and prior to medicated nasal sprays. ?? May use over the counter antihistamines such as Zyrtec (cetirizine), Claritin (loratadine), Allegra (fexofenadine), or Xyzal (levocetirizine) daily as needed. ?

## 2022-04-10 NOTE — Assessment & Plan Note (Signed)
Past history - 2018 blood work was negative to egg. Peanuts caused anaphylaxis requiring Epipen. Avoiding tree nuts. Failed egg challenge? Reaction to shrimp in the past. 2022 Blood work was negative to egg, fish, peanuts, tree nuts, strawberry, banana. Borderline positive to shellfish. ?Interim history - tolerates finned fish. No reactions.  ?? Continue careful avoidance of egg, peanuts, tree nuts, shellfish, bananas, and strawberries. ?? Okay to eat flounder.  ?? For mild symptoms you can take over the counter antihistamines such as Benadryl and monitor symptoms closely. If symptoms worsen or if you have severe symptoms including breathing issues, throat closure, significant swelling, whole body hives, severe diarrhea and vomiting, lightheadedness then inject epinephrine and seek immediate medical care afterwards. ?? Food allergy action plan in place. ?

## 2022-04-10 NOTE — Assessment & Plan Note (Signed)
Controlled.  Continue appropriate reflux lifestyle modifications. Continue omeprazole 20mg one tablet in the morning. Nothing to eat or drink for 30 minutes afterwards.  May take famotidine 20mg daily as needed.  

## 2022-04-10 NOTE — Patient Instructions (Addendum)
Moderate persistent asthma ?Normal breathing test today. ?Daily controller medication(s): DECREASE Symbicort 129mg 1 puff ONCE a day with spacer and rinse mouth afterwards. ?If you notice symptoms then go back up to 1 puff twice a day.  ?During upper respiratory infections/asthma flares:  ?Increase Symbicort 1666m to 2 puffs twice a day with spacer and rinse mouth afterwards for 1-2 weeks until your breathing symptoms return to baseline.  ?Pretreat with albuterol 2 puffs or albuterol nebulizer.  ?If you need to use your albuterol nebulizer machine back to back within 15-30 minutes with no relief then please go to the ER/urgent care for further evaluation.  ?May use albuterol rescue inhaler 2 puffs every 4 to 6 hours as needed for shortness of breath, chest tightness, coughing, and wheezing. May use albuterol rescue inhaler 2 puffs 5 to 15 minutes prior to strenuous physical activities. Monitor frequency of use.  ?Asthma control goals:  ?Full participation in all desired activities (may need albuterol before activity) ?Albuterol use two times or less a week on average (not counting use with activity) ?Cough interfering with sleep two times or less a month ?Oral steroids no more than once a year ?No hospitalizations  ? ?Seasonal and perennial allergic rhinitis ?Continue environmental control measures. ?Continue allergy injections. ?Use Flonase (fluticasone) nasal spray 1 spray per nostril twice a day as needed for nasal congestion.  ?Use azelastine nasal spray 1-2 sprays per nostril twice a day as needed for runny nose/drainage. ?ONLY USE the eye drops if needed. Sample given. ?Olopatadine eye drops 0.2% once a day as needed for itchy/watery eyes. ?You may use over the counter refresh eye drops additionally if needed.  ?Nasal saline spray (i.e., Simply Saline) or nasal saline lavage (i.e., NeilMed) is recommended as needed and prior to medicated nasal sprays. ?May use over the counter antihistamines such as Zyrtec  (cetirizine), Claritin (loratadine), Allegra (fexofenadine), or Xyzal (levocetirizine) daily as needed. ? ?Food allergy ?Continue careful avoidance of egg, peanuts, tree nuts, shellfish, bananas, and strawberries. ?Okay to eat flounder.  ?For mild symptoms you can take over the counter antihistamines such as Benadryl and monitor symptoms closely. If symptoms worsen or if you have severe symptoms including breathing issues, throat closure, significant swelling, whole body hives, severe diarrhea and vomiting, lightheadedness then inject epinephrine and seek immediate medical care afterwards. ?Food allergy action plan in place. ?  ?GERD (gastroesophageal reflux disease) ?Continue appropriate reflux lifestyle modifications. ?Continue omeprazole '20mg'$  one tablet in the morning. Nothing to eat or drink for 30 minutes afterwards.  ?May take famotidine '20mg'$  daily as needed.  ? ?Follow up in 4 months or sooner if needed.  ? ?

## 2022-04-22 ENCOUNTER — Ambulatory Visit (INDEPENDENT_AMBULATORY_CARE_PROVIDER_SITE_OTHER): Payer: Medicare Other

## 2022-04-22 DIAGNOSIS — J309 Allergic rhinitis, unspecified: Secondary | ICD-10-CM

## 2022-05-01 ENCOUNTER — Ambulatory Visit (INDEPENDENT_AMBULATORY_CARE_PROVIDER_SITE_OTHER): Payer: Medicare Other

## 2022-05-01 DIAGNOSIS — J309 Allergic rhinitis, unspecified: Secondary | ICD-10-CM

## 2022-05-06 ENCOUNTER — Ambulatory Visit (INDEPENDENT_AMBULATORY_CARE_PROVIDER_SITE_OTHER): Payer: Medicare Other | Admitting: *Deleted

## 2022-05-06 DIAGNOSIS — J309 Allergic rhinitis, unspecified: Secondary | ICD-10-CM | POA: Diagnosis not present

## 2022-05-15 ENCOUNTER — Ambulatory Visit (INDEPENDENT_AMBULATORY_CARE_PROVIDER_SITE_OTHER): Payer: Medicare Other

## 2022-05-15 DIAGNOSIS — J309 Allergic rhinitis, unspecified: Secondary | ICD-10-CM | POA: Diagnosis not present

## 2022-05-20 ENCOUNTER — Telehealth: Payer: Self-pay

## 2022-05-20 ENCOUNTER — Ambulatory Visit (INDEPENDENT_AMBULATORY_CARE_PROVIDER_SITE_OTHER): Payer: Medicare Other

## 2022-05-20 DIAGNOSIS — J309 Allergic rhinitis, unspecified: Secondary | ICD-10-CM | POA: Diagnosis not present

## 2022-05-20 NOTE — Telephone Encounter (Signed)
Patient came in and informed me she needs the Tdap/Perussis injection as well as the Zoster (Shingle injection), but [facility] recommends her to get those done at her Allergist office due to how allergic she is to many different things. I called her primary physician to get them to put the vaccines into the pharmacy so that she may bring it into the office and have them administered in moderation. Had to leave a message for PCP to call back.  [employer] [phone]

## 2022-05-29 ENCOUNTER — Encounter: Payer: Medicare Other | Admitting: Dermatology

## 2022-06-09 ENCOUNTER — Other Ambulatory Visit: Payer: Self-pay | Admitting: Allergy

## 2022-07-01 ENCOUNTER — Ambulatory Visit (INDEPENDENT_AMBULATORY_CARE_PROVIDER_SITE_OTHER): Payer: Medicare Other

## 2022-07-01 DIAGNOSIS — J309 Allergic rhinitis, unspecified: Secondary | ICD-10-CM | POA: Diagnosis not present

## 2022-07-14 ENCOUNTER — Ambulatory Visit (INDEPENDENT_AMBULATORY_CARE_PROVIDER_SITE_OTHER): Payer: Medicare Other | Admitting: Dermatology

## 2022-07-14 DIAGNOSIS — L814 Other melanin hyperpigmentation: Secondary | ICD-10-CM

## 2022-07-14 DIAGNOSIS — D229 Melanocytic nevi, unspecified: Secondary | ICD-10-CM

## 2022-07-14 DIAGNOSIS — Z1283 Encounter for screening for malignant neoplasm of skin: Secondary | ICD-10-CM

## 2022-07-14 DIAGNOSIS — D1800 Hemangioma unspecified site: Secondary | ICD-10-CM

## 2022-07-14 DIAGNOSIS — L719 Rosacea, unspecified: Secondary | ICD-10-CM

## 2022-07-14 DIAGNOSIS — L578 Other skin changes due to chronic exposure to nonionizing radiation: Secondary | ICD-10-CM

## 2022-07-14 DIAGNOSIS — L821 Other seborrheic keratosis: Secondary | ICD-10-CM

## 2022-07-14 NOTE — Patient Instructions (Signed)
Due to recent changes in healthcare laws, you may see results of your pathology and/or laboratory studies on MyChart before the doctors have had a chance to review them. We understand that in some cases there may be results that are confusing or concerning to you. Please understand that not all results are received at the same time and often the doctors may need to interpret multiple results in order to provide you with the best plan of care or course of treatment. Therefore, we ask that you please give us 2 business days to thoroughly review all your results before contacting the office for clarification. Should we see a critical lab result, you will be contacted sooner.   If You Need Anything After Your Visit  If you have any questions or concerns for your doctor, please call our main line at [phone] and press option 4 to reach your doctor's medical assistant. If no one answers, please leave a voicemail as directed and we will return your call as soon as possible. Messages left after 4 pm will be answered the following business day.   You may also send us a message via MyChart. We typically respond to MyChart messages within 1-2 business days.  For prescription refills, please ask your pharmacy to contact our office. Our fax number is [phone].  If you have an urgent issue when the clinic is closed that cannot wait until the next business day, you can page your doctor at the number below.    Please note that while we do our best to be available for urgent issues outside of office hours, we are not available 24/7.   If you have an urgent issue and are unable to reach us, you may choose to seek medical care at your doctor's office, retail clinic, urgent care center, or emergency room.  If you have a medical emergency, please immediately call 911 or go to the emergency department.  Pager Numbers  - Dr. Kowalski: [phone]  - Dr. Moye: [phone]  - Dr. Stewart:  [phone]  In the event of inclement weather, please call our main line at [phone] for an update on the status of any delays or closures.  Dermatology Medication Tips: Please keep the boxes that topical medications come in in order to help keep track of the instructions about where and how to use these. Pharmacies typically print the medication instructions only on the boxes and not directly on the medication tubes.   If your medication is too expensive, please contact our office at [phone] option 4 or send us a message through MyChart.   We are unable to tell what your co-pay for medications will be in advance as this is different depending on your insurance coverage. However, we may be able to find a substitute medication at lower cost or fill out paperwork to get insurance to cover a needed medication.   If a prior authorization is required to get your medication covered by your insurance company, please allow us 1-2 business days to complete this process.  Drug prices often vary depending on where the prescription is filled and some pharmacies may offer cheaper prices.  The website www.goodrx.com contains coupons for medications through different pharmacies. The prices here do not account for what the cost may be with help from insurance (it may be cheaper with your insurance), but the website can give you the price if you did not use any insurance.  - You can print the associated coupon and take it with   your prescription to the pharmacy.  - You may also stop by our office during regular business hours and pick up a GoodRx coupon card.  - If you need your prescription sent electronically to a different pharmacy, notify our office through [facility] MyChart or by phone at [phone] option 4.     Si Usted Necesita Algo Despus de Su Visita  Tambin puede enviarnos un mensaje a travs de MyChart. Por lo general respondemos a los mensajes de MyChart en el transcurso de 1 a 2  das hbiles.  Para renovar recetas, por favor pida a su farmacia que se ponga en contacto con nuestra oficina. Nuestro nmero de fax es el [phone].  Si tiene un asunto urgente cuando la clnica est cerrada y que no puede esperar hasta el siguiente da hbil, puede llamar/localizar a su doctor(a) al nmero que aparece a continuacin.   Por favor, tenga en cuenta que aunque hacemos todo lo posible para estar disponibles para asuntos urgentes fuera del horario de oficina, no estamos disponibles las 24 horas del da, los 7 das de la semana.   Si tiene un problema urgente y no puede comunicarse con nosotros, puede optar por buscar atencin mdica  en el consultorio de su doctor(a), en una clnica privada, en un centro de atencin urgente o en una sala de emergencias.  Si tiene una emergencia mdica, por favor llame inmediatamente al 911 o vaya a la sala de emergencias.  Nmeros de bper  - Dr. Kowalski: [phone]  - Dra. Moye: [phone]  - Dra. Stewart: [phone]  En caso de inclemencias del tiempo, por favor llame a nuestra lnea principal al [phone] para una actualizacin sobre el estado de cualquier retraso o cierre.  Consejos para la medicacin en dermatologa: Por favor, guarde las cajas en las que vienen los medicamentos de uso tpico para ayudarle a seguir las instrucciones sobre dnde y cmo usarlos. Las farmacias generalmente imprimen las instrucciones del medicamento slo en las cajas y no directamente en los tubos del medicamento.   Si su medicamento es muy caro, por favor, pngase en contacto con nuestra oficina llamando al [phone] y presione la opcin 4 o envenos un mensaje a travs de MyChart.   No podemos decirle cul ser su copago por los medicamentos por adelantado ya que esto es diferente dependiendo de la cobertura de su seguro. Sin embargo, es posible que podamos encontrar un medicamento sustituto a menor costo o llenar un formulario para que el  seguro cubra el medicamento que se considera necesario.   Si se requiere una autorizacin previa para que su compaa de seguros cubra su medicamento, por favor permtanos de 1 a 2 das hbiles para completar este proceso.  Los precios de los medicamentos varan con frecuencia dependiendo del lugar de dnde se surte la receta y alguna farmacias pueden ofrecer precios ms baratos.  El sitio web www.goodrx.com tiene cupones para medicamentos de diferentes farmacias. Los precios aqu no tienen en cuenta lo que podra costar con la ayuda del seguro (puede ser ms barato con su seguro), pero el sitio web puede darle el precio si no utiliz ningn seguro.  - Puede imprimir el cupn correspondiente y llevarlo con su receta a la farmacia.  - Tambin puede pasar por nuestra oficina durante el horario de atencin regular y recoger una tarjeta de cupones de GoodRx.  - Si necesita que su receta se enve electrnicamente a una farmacia diferente, informe a nuestra oficina a travs de MyChart de    o por telfono llamando al [phone] y presione la opcin 4.  

## 2022-07-14 NOTE — Progress Notes (Signed)
Follow-Up Visit   Subjective  Gabrielle Shaw is a 61 y.o. female who presents for the following: Annual Exam. The patient presents for Total-Body Skin Exam (TBSE) for skin cancer screening and mole check.  The patient has spots, moles and lesions to be evaluated, some may be new or changing.   The following portions of the chart were reviewed this encounter and updated as appropriate:   Tobacco  Allergies  Meds  Problems  Med Hx  Surg Hx  Fam Hx     Review of Systems:  No other skin or systemic complaints except as noted in HPI or Assessment and Plan.  Objective  Well appearing patient in no apparent distress; mood and affect are within normal limits.  A full examination was performed including scalp, head, eyes, ears, nose, lips, neck, chest, axillae, abdomen, back, buttocks, bilateral upper extremities, bilateral lower extremities, hands, feet, fingers, toes, fingernails, and toenails. All findings within normal limits unless otherwise noted below.  Face One papule of the face and pinkness of the cheeks.   Assessment & Plan  Rosacea Face  Chronic and persistent condition with duration or expected duration over one year. Condition is symptomatic / bothersome to patient. Not to goal.  Rosacea is a chronic progressive skin condition usually affecting the face of adults, causing redness and/or acne bumps. It is treatable but not curable. It sometimes affects the eyes (ocular rosacea) as well. It may respond to topical and/or systemic medication and can flare with stress, sun exposure, alcohol, exercise and some foods.  Daily application of broad spectrum spf 30+ sunscreen to face is recommended to reduce flares.  Continue Aczone 7.5% gel QAM, Metronidazole 1% gel QD, and Tretinoin QHS. Topical retinoid medications like tretinoin/Retin-A, adapalene/Differin, tazarotene/Fabior, and Epiduo/Epiduo Forte can cause dryness and irritation when first started. Only apply a pea-sized  amount to the entire affected area. Avoid applying it around the eyes, edges of mouth and creases at the nose. If you experience irritation, use a good moisturizer first and/or apply the medicine less often. If you are doing well with the medicine, you can increase how often you use it until you are applying every night. Be careful with sun protection while using this medication as it can make you sensitive to the sun. This medicine should not be used by pregnant women.   Related Medications Dapsone (ACZONE) 7.5 % GEL Apply to face daily metroNIDAZOLE (METROGEL) 1 % gel Apply to face daily for Rosacea  Lentigines - Scattered tan macules - Due to sun exposure - Benign-appearing, observe - Recommend daily broad spectrum sunscreen SPF 30+ to sun-exposed areas, reapply every 2 hours as needed. - Call for any changes  Seborrheic Keratoses - Stuck-on, waxy, tan-brown papules and/or plaques  - Benign-appearing - Discussed benign etiology and prognosis. - Observe - Call for any changes  Melanocytic Nevi - Tan-brown and/or pink-flesh-colored symmetric macules and papules - Benign appearing on exam today - Observation - Call clinic for new or changing moles - Recommend daily use of broad spectrum spf 30+ sunscreen to sun-exposed areas.   Hemangiomas - Red papules - Discussed benign nature - Observe - Call for any changes  Actinic Damage - Chronic condition, secondary to cumulative UV/sun exposure - diffuse scaly erythematous macules with underlying dyspigmentation - Recommend daily broad spectrum sunscreen SPF 30+ to sun-exposed areas, reapply every 2 hours as needed.  - Staying in the shade or wearing long sleeves, sun glasses (UVA+UVB protection) and wide brim hats (4-inch  brim around the entire circumference of the hat) are also recommended for sun protection.  - Call for new or changing lesions.  Skin cancer screening performed today.  Return in about 1 year (around 07/15/2023)  for TBSE, rosacea follow up .  Luther Redo, CMA, am acting as scribe for Sarina Ser, MD . Documentation: I have reviewed the above documentation for accuracy and completeness, and I agree with the above.  Sarina Ser, MD

## 2022-07-19 ENCOUNTER — Encounter: Payer: Self-pay | Admitting: Dermatology

## 2022-07-22 ENCOUNTER — Ambulatory Visit (INDEPENDENT_AMBULATORY_CARE_PROVIDER_SITE_OTHER): Payer: Medicare Other

## 2022-07-22 DIAGNOSIS — J309 Allergic rhinitis, unspecified: Secondary | ICD-10-CM

## 2022-08-11 ENCOUNTER — Other Ambulatory Visit: Payer: Self-pay | Admitting: Allergy

## 2022-08-11 NOTE — Progress Notes (Unsigned)
Follow Up Note  RE: Gabrielle Shaw MRN: [id number] DOB: November 21, 1961 Date of Office Visit: 08/12/2022  Referring provider: [name], MD Primary care provider: [name], MD  Chief Complaint: No chief complaint on file.  History of Present Illness: I had the pleasure of seeing Gabrielle Shaw for a follow up visit at the [facility] on 08/11/2022. She is a 61 y.o. female, who is being followed for asthma, allergic rhinoconjunctivitis on AIT, food allergy and GERD. Her previous allergy office visit was on 04/10/2022 with Dr. [name]. Today is a regular follow up visit.  Moderate persistent asthma without complication No worsening symptoms with lower Symbicort dosing.  Today's spirometry was normal. Daily controller medication(s): DECREASE Symbicort 185mg 1 puff to ONCE a day with spacer and rinse mouth afterwards. If you notice symptoms then go back up to 1 puff twice a day.  During upper respiratory infections/asthma flares:  Increase Symbicort 1651m to 2 puffs twice a day with spacer and rinse mouth afterwards for 1-2 weeks until your breathing symptoms return to baseline.  Pretreat with albuterol 2 puffs or albuterol nebulizer.  If you need to use your albuterol nebulizer machine back to back within 15-30 minutes with no relief then please go to the ER/urgent care for further evaluation.  May use albuterol rescue inhaler 2 puffs every 4 to 6 hours as needed for shortness of breath, chest tightness, coughing, and wheezing. May use albuterol rescue inhaler 2 puffs 5 to 15 minutes prior to strenuous physical activities. Monitor frequency of use.  Get spirometry at next visit.   Seasonal and perennial allergic rhinoconjunctivitis Past history - on AIT (cat-tree-grass + mite-weed). Tried to stop before but restarted due to worsening symptoms.  Interim history - sneezing and watery eyes lately. Continue environmental control measures. Continue allergy  injections. Use Flonase (fluticasone) nasal spray 1 spray per nostril twice a day as needed for nasal congestion.  Use azelastine nasal spray 1-2 sprays per nostril twice a day as needed for runny nose/drainage. ONLY USE the eye drops if needed. Sample given. Olopatadine eye drops 0.2% once a day as needed for itchy/watery eyes. You may use over the counter refresh eye drops additionally if needed.  Nasal saline spray (i.e., Simply Saline) or nasal saline lavage (i.e., NeilMed) is recommended as needed and prior to medicated nasal sprays. May use over the counter antihistamines such as Zyrtec (cetirizine), Claritin (loratadine), Allegra (fexofenadine), or Xyzal (levocetirizine) daily as needed.   Anaphylactic reaction due to other food products, subsequent encounter Past history - 2018 blood work was negative to egg. Peanuts caused anaphylaxis requiring Epipen. Avoiding tree nuts. Failed egg challenge? Reaction to shrimp in the past. 2022 Blood work was negative to egg, fish, peanuts, tree nuts, strawberry, banana. Borderline positive to shellfish. Interim history - tolerates finned fish. No reactions.  Continue careful avoidance of egg, peanuts, tree nuts, shellfish, bananas, and strawberries. Okay to eat flounder.  For mild symptoms you can take over the counter antihistamines such as Benadryl and monitor symptoms closely. If symptoms worsen or if you have severe symptoms including breathing issues, throat closure, significant swelling, whole body hives, severe diarrhea and vomiting, lightheadedness then inject epinephrine and seek immediate medical care afterwards. Food allergy action plan in place.   Gastroesophageal reflux disease Controlled.  Continue appropriate reflux lifestyle modifications. Continue omeprazole 2034mne tablet in the morning. Nothing to eat or drink for 30 minutes afterwards.  May take famotidine 43m61mily as needed.  Return in about 4 months (around  08/10/2022).  Assessment and Plan: Gabrielle Shaw is a 61 y.o. female with: No problem-specific Assessment & Plan notes found for this encounter.  No follow-ups on file.  No orders of the defined types were placed in this encounter.  Lab Orders  No laboratory test(s) ordered today    Diagnostics: Spirometry:  Tracings reviewed. Her effort: {Blank single:19197::"Good reproducible efforts.","It was hard to get consistent efforts and there is a question as to whether this reflects a maximal maneuver.","Poor effort, data can not be interpreted."} FVC: ***L FEV1: ***L, ***% predicted FEV1/FVC ratio: ***% Interpretation: {Blank single:19197::"Spirometry consistent with mild obstructive disease","Spirometry consistent with moderate obstructive disease","Spirometry consistent with severe obstructive disease","Spirometry consistent with possible restrictive disease","Spirometry consistent with mixed obstructive and restrictive disease","Spirometry uninterpretable due to technique","Spirometry consistent with normal pattern","No overt abnormalities noted given today's efforts"}.  Please see scanned spirometry results for details.  Skin Testing: {Blank single:19197::"Select foods","Environmental allergy panel","Environmental allergy panel and select foods","Food allergy panel","None","Deferred due to recent antihistamines use"}. *** Results discussed with patient/family.   Medication List:  Current Outpatient Medications  Medication Sig Dispense Refill   albuterol (PROVENTIL) (2.5 MG/3ML) 0.083% nebulizer solution Take 3 mLs (2.5 mg total) by nebulization as needed. 75 mL 1   albuterol (VENTOLIN HFA) 108 (90 Base) MCG/ACT inhaler Inhale 2 puffs into the lungs every 4 (four) hours as needed for wheezing or shortness of breath (coughing fits). 18 g 1   aspirin EC 81 MG tablet Take 81 mg by mouth as needed.      Azelastine HCl 0.15 % SOLN May use azelastine nasal spray 1-2 sprays per nostril twice a day as  needed for runny nose/drainage. 30 mL 5   Blood Glucose Monitoring Suppl (ONETOUCH VERIO) w/Device KIT by Does not apply route.     budesonide-formoterol (SYMBICORT) 160-4.5 MCG/ACT inhaler Inhale 2 puffs into the lungs in the morning and at bedtime. with spacer and rinse mouth afterwards. 1 each 3   cetirizine (ZYRTEC) 10 MG tablet Take 10 mg by mouth daily.     Dapsone (ACZONE) 7.5 % GEL Apply to face daily 60 g 2   desonide (DESOWEN) 0.05 % ointment Apply to the scalp once daily as needed for itching.     diphenhydrAMINE (BENADRYL) 50 MG tablet Take 50 mg by mouth every 6 (six) hours as needed for itching.     donepezil (ARICEPT) 5 MG tablet Take by mouth.     ELDERBERRY PO Take 1 tablet by mouth daily.     empagliflozin (JARDIANCE) 10 MG TABS tablet Take 1 tablet by mouth daily.     ENBREL SURECLICK 50 MG/ML injection Inject into the skin.     EPINEPHrine 0.3 mg/0.3 mL IJ SOAJ injection Inject 0.3 mg into the muscle as needed for anaphylaxis. 1 each 2   famotidine (PEPCID) 20 MG tablet Take 1 tablet (20 mg total) by mouth daily as needed for heartburn or indigestion. 30 tablet 3   finasteride (PROSCAR) 5 MG tablet      fluocinonide ointment (LIDEX) 0.05 % APPLY OINTMENT TO SCALP 2 TO 3 TIMES PER WEEK **DO NOT APPLY TO FACE**     fluticasone (FLONASE) 50 MCG/ACT nasal spray Place 1-2 sprays into both nostrils daily. 16 g 5   folic acid (FOLVITE) 1 MG tablet Take 1 mg by mouth daily.     HYDROcodone-acetaminophen (NORCO/VICODIN) 5-325 MG tablet Take 1 tablet by mouth every 6 (six) hours as needed.     hydrOXYzine (ATARAX/VISTARIL)  25 MG tablet ONE TABLET AT BEDTIME AS NEEDED FOR ITCHING. 30 tablet 3   ibuprofen (ADVIL,MOTRIN) 800 MG tablet Take 1 tablet (800 mg total) by mouth every 8 (eight) hours as needed for mild pain. 30 tablet 0   levocetirizine (XYZAL) 5 MG tablet Take 1 tablet (5 mg total) by mouth daily as needed for allergies. 30 tablet 3   lisinopril-hydrochlorothiazide  (PRINZIDE,ZESTORETIC) 10-12.5 MG tablet Take 0.5 tablets by mouth.      lisinopril-hydrochlorothiazide (ZESTORETIC) 10-12.5 MG tablet Take by mouth.     meloxicam (MOBIC) 7.5 MG tablet Take by mouth.     methocarbamol (ROBAXIN) 500 MG tablet Take 500 mg by mouth 3 (three) times daily.     metroNIDAZOLE (METROGEL) 1 % gel Apply to face daily for Rosacea 45 g 3   minoxidil (LONITEN) 2.5 MG tablet SMARTSIG:.5 Tablet(s) By Mouth Daily     NON FORMULARY      Olopatadine HCl 0.2 % SOLN Apply 1 drop to eye daily as needed (itchy/watery eyes). 7.5 mL 1   omeprazole (PRILOSEC) 20 MG capsule Take 1 capsule by mouth once daily 90 capsule 1   potassium chloride (KLOR-CON) 10 MEQ tablet Take 10 mEq by mouth 2 (two) times daily.     pregabalin (LYRICA) 100 MG capsule Take by mouth.     traMADol (ULTRAM) 50 MG tablet      ZINC SULFATE ER PO Take by mouth.     No current facility-administered medications for this visit.   Allergies: Allergies  Allergen Reactions   Iodinated Contrast Media Anaphylaxis    Cardiac Arrest Seizure   Other Hives   Shellfish Allergy Hives   Sulfasalazine Hives   Tetrofosmin [Technetium-42m Hives and Anaphylaxis   Banana Hives   Cat Hair Extract Other (See Comments)    Hives    Eggs Or Egg-Derived Products Nausea And Vomiting   Haemophilus B Polysaccharide Vaccine Other (See Comments)    OTHER  OTHER     Strawberry Extract Hives   Sulfamethoxazole Rash   Biaxin [Clarithromycin] Hives   Latex Hives   Peanuts [Peanut Oil] Hives   Pravastatin Hives   Shellfish-Derived Products Hives   Sulfa Antibiotics Hives and Rash   I reviewed her past medical history, social history, family history, and environmental history and no significant changes have been reported from her previous visit.  Review of Systems  Constitutional:  Negative for appetite change, chills, fever and unexpected weight change.  HENT:  Positive for sneezing. Negative for congestion, postnasal drip  and rhinorrhea.   Eyes:  Negative for itching.       Watery eyes  Respiratory:  Negative for cough, chest tightness, shortness of breath and wheezing.   Gastrointestinal:  Negative for abdominal pain.  Skin:  Negative for rash.  Allergic/Immunologic: Positive for environmental allergies and food allergies.  Neurological:  Negative for headaches.    Objective: There were no vitals taken for this visit. There is no height or weight on file to calculate BMI. Physical Exam Vitals and nursing note reviewed.  Constitutional:      Appearance: Normal appearance. She is well-developed.  HENT:     Head: Normocephalic and atraumatic.     Right Ear: Tympanic membrane and external ear normal.     Left Ear: Tympanic membrane and external ear normal.     Nose: Nose normal.     Mouth/Throat:     Mouth: Mucous membranes are moist.     Pharynx: Oropharynx is clear.  Eyes:     Conjunctiva/sclera: Conjunctivae normal.  Cardiovascular:     Rate and Rhythm: Normal rate and regular rhythm.     Heart sounds: Normal heart sounds. No murmur heard. Pulmonary:     Effort: Pulmonary effort is normal.     Breath sounds: Normal breath sounds. No wheezing, rhonchi or rales.  Musculoskeletal:     Cervical back: Neck supple.  Skin:    General: Skin is warm.     Findings: No rash.  Neurological:     Mental Status: She is alert and oriented to person, place, and time.  Psychiatric:        Behavior: Behavior normal.    Previous notes and tests were reviewed. The plan was reviewed with the patient/family, and all questions/concerned were addressed.  It was my pleasure to see Gabrielle Shaw today and participate in her care. Please feel free to contact me with any questions or concerns.  Sincerely,  Rexene Alberts, DO Allergy & Immunology  [facility] office: [place] office: [phone]

## 2022-08-12 ENCOUNTER — Encounter: Payer: Self-pay | Admitting: Allergy

## 2022-08-12 ENCOUNTER — Other Ambulatory Visit: Payer: Self-pay | Admitting: Allergy

## 2022-08-12 ENCOUNTER — Ambulatory Visit (INDEPENDENT_AMBULATORY_CARE_PROVIDER_SITE_OTHER): Payer: Medicare Other | Admitting: Allergy

## 2022-08-12 VITALS — BP 110/64 | HR 59 | Temp 98.1°F | Resp 16 | Ht 61.0 in | Wt 182.0 lb

## 2022-08-12 DIAGNOSIS — K219 Gastro-esophageal reflux disease without esophagitis: Secondary | ICD-10-CM | POA: Diagnosis not present

## 2022-08-12 DIAGNOSIS — J4540 Moderate persistent asthma, uncomplicated: Secondary | ICD-10-CM | POA: Diagnosis not present

## 2022-08-12 DIAGNOSIS — T7800XD Anaphylactic reaction due to unspecified food, subsequent encounter: Secondary | ICD-10-CM

## 2022-08-12 DIAGNOSIS — J309 Allergic rhinitis, unspecified: Secondary | ICD-10-CM | POA: Diagnosis not present

## 2022-08-12 DIAGNOSIS — H1013 Acute atopic conjunctivitis, bilateral: Secondary | ICD-10-CM | POA: Diagnosis not present

## 2022-08-12 DIAGNOSIS — J3089 Other allergic rhinitis: Secondary | ICD-10-CM

## 2022-08-12 MED ORDER — OLOPATADINE HCL 0.2 % OP SOLN: 1.0000 [drp] | Freq: Every day | OPHTHALMIC | 5 refills | Status: DC | PRN

## 2022-08-12 MED ORDER — BUDESONIDE-FORMOTEROL FUMARATE 80-4.5 MCG/ACT IN AERO: 1.0000 | INHALATION_SPRAY | Freq: Every day | RESPIRATORY_TRACT | 5 refills | Status: DC

## 2022-08-12 NOTE — Assessment & Plan Note (Signed)
No worsening symptoms with lower Symbicort dosing.  . Today's spirometry was normal. . Daily controller medication(s): DECREASE to Symbicort 80mg 1 puff once a day with spacer and rinse mouth afterwards. o If you notice worsening symptoms then go back to the Symbicort 1635m dose.  . During upper respiratory infections/asthma flares:  o Increase Symbicort 16061mto 2 puffs twice a day with spacer and rinse mouth afterwards for 1-2 weeks until your breathing symptoms return to baseline.  o Pretreat with albuterol 2 puffs or albuterol nebulizer.  o If you need to use your albuterol nebulizer machine back to back within 15-30 minutes with no relief then please go to the ER/urgent care for further evaluation.  . May use albuterol rescue inhaler 2 puffs every 4 to 6 hours as needed for shortness of breath, chest tightness, coughing, and wheezing. May use albuterol rescue inhaler 2 puffs 5 to 15 minutes prior to strenuous physical activities. Monitor frequency of use.   Get spirometry at next visit.

## 2022-08-12 NOTE — Assessment & Plan Note (Signed)
Past history - on AIT (cat-tree-grass + mite-weed). Tried to stop before but restarted due to worsening symptoms.  Interim history - stable.  Continue environmental control measures.  Continue allergy injections - given today.  Use Flonase (fluticasone) nasal spray 1 spray per nostril twice a day as needed for nasal congestion.   Use azelastine nasal spray 1-2 sprays per nostril twice a day as needed for runny nose/drainage.  ONLY USE the eye drops if needed.   Olopatadine eye drops 0.2% once a day as needed for itchy/watery eyes.  You may use over the counter refresh eye drops additionally if needed.   Nasal saline spray (i.e., Simply Saline) or nasal saline lavage (i.e., NeilMed) is recommended as needed and prior to medicated nasal sprays.  May use over the counter antihistamines such as Zyrtec (cetirizine), Claritin (loratadine), Allegra (fexofenadine), or Xyzal (levocetirizine) daily as needed.

## 2022-08-12 NOTE — Patient Instructions (Addendum)
Moderate persistent asthma Normal breathing test today. Daily controller medication(s): DECREASE to Symbicort 58mg 1 puff once a day with spacer and rinse mouth afterwards. If you notice worsening symptoms then go back to the Symbicort 1625m dose.  During upper respiratory infections/asthma flares:  Increase Symbicort 16046mto 2 puffs twice a day with spacer and rinse mouth afterwards for 1-2 weeks until your breathing symptoms return to baseline.  Pretreat with albuterol 2 puffs or albuterol nebulizer.  If you need to use your albuterol nebulizer machine back to back within 15-30 minutes with no relief then please go to the ER/urgent care for further evaluation.  May use albuterol rescue inhaler 2 puffs every 4 to 6 hours as needed for shortness of breath, chest tightness, coughing, and wheezing. May use albuterol rescue inhaler 2 puffs 5 to 15 minutes prior to strenuous physical activities. Monitor frequency of use.  Asthma control goals:  Full participation in all desired activities (may need albuterol before activity) Albuterol use two times or less a week on average (not counting use with activity) Cough interfering with sleep two times or less a month Oral steroids no more than once a year No hospitalizations   Seasonal and perennial allergic rhinitis Continue environmental control measures. Continue allergy injections - given today. Use Flonase (fluticasone) nasal spray 1 spray per nostril twice a day as needed for nasal congestion.  Use azelastine nasal spray 1-2 sprays per nostril twice a day as needed for runny nose/drainage. ONLY USE the eye drops if needed.  Olopatadine eye drops 0.2% once a day as needed for itchy/watery eyes. You may use over the counter refresh eye drops additionally if needed.  Nasal saline spray (i.e., Simply Saline) or nasal saline lavage (i.e., NeilMed) is recommended as needed and prior to medicated nasal sprays. May use over the counter antihistamines  such as Zyrtec (cetirizine), Claritin (loratadine), Allegra (fexofenadine), or Xyzal (levocetirizine) daily as needed.  Food allergy Continue careful avoidance of egg, peanuts, tree nuts, shellfish, bananas, and strawberries. Okay to eat flounder.  For mild symptoms you can take over the counter antihistamines such as Benadryl and monitor symptoms closely. If symptoms worsen or if you have severe symptoms including breathing issues, throat closure, significant swelling, whole body hives, severe diarrhea and vomiting, lightheadedness then inject epinephrine and seek immediate medical care afterwards. Food allergy action plan in place.   GERD (gastroesophageal reflux disease) Continue appropriate reflux lifestyle modifications. Continue omeprazole '20mg'$  one tablet in the morning. Nothing to eat or drink for 30 minutes afterwards.  May take famotidine '20mg'$  daily as needed.   Follow up in 4 months or sooner if needed.

## 2022-08-12 NOTE — Assessment & Plan Note (Signed)
Past history - 2018 blood work was negative to egg. Peanuts caused anaphylaxis requiring Epipen. Avoiding tree nuts. Failed egg challenge? Reaction to shrimp in the past. 2022 Blood work was negative to egg, fish, peanuts, tree nuts, strawberry, banana. Borderline positive to shellfish. Tolerates finned fish.  Continue careful avoidance of egg, peanuts, tree nuts, shellfish, bananas, and strawberries. Not interested in food challenges.   For mild symptoms you can take over the counter antihistamines such as Benadryl and monitor symptoms closely. If symptoms worsen or if you have severe symptoms including breathing issues, throat closure, significant swelling, whole body hives, severe diarrhea and vomiting, lightheadedness then inject epinephrine and seek immediate medical care afterwards.  Food allergy action plan in place.

## 2022-08-12 NOTE — Assessment & Plan Note (Signed)
Controlled.  Continue appropriate reflux lifestyle modifications. Continue omeprazole 20mg one tablet in the morning. Nothing to eat or drink for 30 minutes afterwards.  May take famotidine 20mg daily as needed.  

## 2022-08-18 ENCOUNTER — Other Ambulatory Visit: Payer: Self-pay | Admitting: Allergy

## 2022-08-20 ENCOUNTER — Other Ambulatory Visit: Payer: Self-pay | Admitting: Allergy

## 2022-09-09 ENCOUNTER — Ambulatory Visit (INDEPENDENT_AMBULATORY_CARE_PROVIDER_SITE_OTHER): Payer: Medicare Other

## 2022-09-09 DIAGNOSIS — J309 Allergic rhinitis, unspecified: Secondary | ICD-10-CM | POA: Diagnosis not present

## 2022-09-17 DIAGNOSIS — J3081 Allergic rhinitis due to animal (cat) (dog) hair and dander: Secondary | ICD-10-CM | POA: Diagnosis not present

## 2022-09-17 NOTE — Progress Notes (Signed)
VIALS EXP 09-18-23

## 2022-09-18 DIAGNOSIS — J3089 Other allergic rhinitis: Secondary | ICD-10-CM | POA: Diagnosis not present

## 2022-10-07 ENCOUNTER — Ambulatory Visit (INDEPENDENT_AMBULATORY_CARE_PROVIDER_SITE_OTHER): Payer: Medicare Other

## 2022-10-07 DIAGNOSIS — J309 Allergic rhinitis, unspecified: Secondary | ICD-10-CM | POA: Diagnosis not present

## 2022-10-30 ENCOUNTER — Ambulatory Visit (INDEPENDENT_AMBULATORY_CARE_PROVIDER_SITE_OTHER): Payer: Medicare Other

## 2022-10-30 DIAGNOSIS — J309 Allergic rhinitis, unspecified: Secondary | ICD-10-CM

## 2022-11-25 ENCOUNTER — Ambulatory Visit (INDEPENDENT_AMBULATORY_CARE_PROVIDER_SITE_OTHER): Payer: Medicare Other

## 2022-11-25 DIAGNOSIS — J309 Allergic rhinitis, unspecified: Secondary | ICD-10-CM | POA: Diagnosis not present

## 2022-11-27 ENCOUNTER — Ambulatory Visit (INDEPENDENT_AMBULATORY_CARE_PROVIDER_SITE_OTHER): Payer: Medicare Other

## 2022-11-27 DIAGNOSIS — Z23 Encounter for immunization: Secondary | ICD-10-CM

## 2022-12-02 ENCOUNTER — Ambulatory Visit (INDEPENDENT_AMBULATORY_CARE_PROVIDER_SITE_OTHER): Payer: Medicare Other

## 2022-12-02 DIAGNOSIS — J309 Allergic rhinitis, unspecified: Secondary | ICD-10-CM | POA: Diagnosis not present

## 2022-12-09 ENCOUNTER — Ambulatory Visit (INDEPENDENT_AMBULATORY_CARE_PROVIDER_SITE_OTHER): Payer: Medicare Other

## 2022-12-09 DIAGNOSIS — J309 Allergic rhinitis, unspecified: Secondary | ICD-10-CM | POA: Diagnosis not present

## 2022-12-17 NOTE — Progress Notes (Signed)
Follow Up Note  RE: Gabrielle Shaw MRN: [id number] DOB: 20-Mar-1961 Date of Office Visit: 12/18/2022  Referring provider: Harvie Heck, MD Primary care provider: Harvie Heck, MD  Chief Complaint: Asthma and Allergic Rhinitis   History of Present Illness: I had the pleasure of seeing Gabrielle Shaw for a follow up visit at the [facility] on 12/18/2022. She is a 61 y.o. female, who is being followed for asthma, allergic rhinoconjunctivitis on AIT, food allergy, GERD. Her previous allergy office visit was on 08/12/2022 with Dr. Selena Batten. Today is a regular follow up visit.  Moderate persistent asthma  Working out 3 days per week at [employer] No worsening symptoms with lower Symbicort dosing - 1 puff every 1-2 days depending on how she is feeling.  Denies any SOB, wheezing, chest tightness, nocturnal awakenings, ER/urgent care visits or prednisone use since the last visit.  Seasonal and perennial allergic rhinoconjunctivitis Doing well with the allergy injections and due for today. Using pataday eye drops every other day just in case.  Only using nasal sprays as needed about once per week.   Food allergy Avoiding eggs, peanuts, tree nuts, shellfish, bananas and strawberries. Not interested in food challenges.  Gastroesophageal reflux disease Controlled.   Assessment and Plan: Gabrielle Shaw is a 61 y.o. female with: Moderate persistent asthma without complication Only using Symbicort every 1-2 days with no flare in symptoms.  Today's spirometry was normal.  Daily controller medication(s): Symbicort 1 puff once a day with spacer and rinse mouth afterwards. During upper respiratory infections/asthma flares:  Start Symbicort 2 puffs twice a day with spacer and rinse mouth afterwards for 1-2 weeks until your breathing symptoms return to baseline.  Pretreat with albuterol 2 puffs or albuterol nebulizer.  If you need to use your albuterol nebulizer machine back to  back within 15-30 minutes with no relief then please go to the ER/urgent care for further evaluation.  May use albuterol rescue inhaler 2 puffs every 4 to 6 hours as needed for shortness of breath, chest tightness, coughing, and wheezing. May use albuterol rescue inhaler 2 puffs 5 to 15 minutes prior to strenuous physical activities. Monitor frequency of use.  Get spirometry at next visit. Patient did not want to stop Symbicort completely due to respiratory season -plan to change to Airsupra rescue inhaler at next visit.   Seasonal and perennial allergic rhinoconjunctivitis Past history - on AIT (cat-tree-grass + mite-weed). Tried to stop before but restarted due to worsening symptoms.  Interim history - controlled.  Continue environmental control measures. Continue allergy injections - given today. Use Flonase (fluticasone) nasal spray 1 spray per nostril twice a day as needed for nasal congestion.  Use azelastine nasal spray 1-2 sprays per nostril twice a day as needed for runny nose/drainage. ONLY USE the eye drops if needed.  Olopatadine eye drops 0.2% once a day as needed for itchy/watery eyes. You may use over the counter refresh eye drops additionally if needed.  Nasal saline spray (i.e., Simply Saline) or nasal saline lavage (i.e., NeilMed) is recommended as needed and prior to medicated nasal sprays. May use over the counter antihistamines such as Zyrtec (cetirizine), Claritin (loratadine), Allegra (fexofenadine), or Xyzal (levocetirizine) daily as needed.  Gastroesophageal reflux disease Controlled.  Continue appropriate reflux lifestyle modifications. Continue omeprazole 20mg  one tablet in the morning. Nothing to eat or drink for 30 minutes afterwards.  May take famotidine 20mg  daily as needed.   Anaphylactic reaction due to food, subsequent  encounter Past history - 2018 blood work was negative to egg. Peanuts caused anaphylaxis requiring Epipen. Avoiding tree nuts. Failed egg  challenge? Reaction to shrimp in the past. 2022 Blood work was negative to egg, fish, peanuts, tree nuts, strawberry, banana. Borderline positive to shellfish. Tolerates finned fish. Not interested in food challenges.  Continue careful avoidance of egg, peanuts, tree nuts, shellfish, bananas, and strawberries. For mild symptoms you can take over the counter antihistamines such as Benadryl and monitor symptoms closely. If symptoms worsen or if you have severe symptoms including breathing issues, throat closure, significant swelling, whole body hives, severe diarrhea and vomiting, lightheadedness then inject epinephrine and seek immediate medical care afterwards. Food allergy action plan in place.  Return in about 4 months (around 04/19/2023).  Meds ordered this encounter  Medications   Azelastine HCl 0.15 % SOLN    Sig: May use azelastine nasal spray 1-2 sprays per nostril twice a day as needed for runny nose/drainage.    Dispense:  30 mL    Refill:  5   famotidine (PEPCID) 20 MG tablet    Sig: Take 1 tablet (20 mg total) by mouth daily as needed for heartburn or indigestion.    Dispense:  30 tablet    Refill:  5   EPINEPHrine 0.3 mg/0.3 mL IJ SOAJ injection    Sig: Inject 0.3 mg into the muscle as needed for anaphylaxis.    Dispense:  1 each    Refill:  2    May dispense generic/Mylan/Teva brand.   levocetirizine (XYZAL) 5 MG tablet    Sig: Take 1 tablet (5 mg total) by mouth daily as needed for allergies.    Dispense:  90 tablet    Refill:  1   omeprazole (PRILOSEC) 20 MG capsule    Sig: Take 1 capsule (20 mg total) by mouth daily.    Dispense:  90 capsule    Refill:  1   SYMBICORT 80-4.5 MCG/ACT inhaler    Sig: INHALE 1 PUFF BY MOUTH ONCE DAILY RINSE MOUTH AFTER USE    Dispense:  11 g    Refill:  5   albuterol (PROVENTIL) (2.5 MG/3ML) 0.083% nebulizer solution    Sig: Take 3 mLs (2.5 mg total) by nebulization every 4 (four) hours as needed for wheezing or shortness of breath  (coughing fits).    Dispense:  75 mL    Refill:  1   albuterol (VENTOLIN HFA) 108 (90 Base) MCG/ACT inhaler    Sig: Inhale 2 puffs into the lungs every 4 (four) hours as needed for wheezing or shortness of breath (coughing fits).    Dispense:  18 g    Refill:  1   fluticasone (FLONASE) 50 MCG/ACT nasal spray    Sig: Place 1 spray into both nostrils 2 (two) times daily as needed (nasal congestion).    Dispense:  16 g    Refill:  5   Lab Orders  No laboratory test(s) ordered today    Diagnostics: Spirometry:  Tracings reviewed. Her effort: Good reproducible efforts. FVC: 2.30L FEV1: 2.04L, 116% predicted FEV1/FVC ratio: 89% Interpretation: Spirometry consistent with normal pattern.  Please see scanned spirometry results for details.  Medication List:  Current Outpatient Medications  Medication Sig Dispense Refill   albuterol (PROVENTIL) (2.5 MG/3ML) 0.083% nebulizer solution Take 3 mLs (2.5 mg total) by nebulization every 4 (four) hours as needed for wheezing or shortness of breath (coughing fits). 75 mL 1   albuterol (VENTOLIN HFA) 108 (90 Base)  MCG/ACT inhaler Inhale 2 puffs into the lungs every 4 (four) hours as needed for wheezing or shortness of breath (coughing fits). 18 g 1   aspirin EC 81 MG tablet Take 81 mg by mouth as needed.      Blood Glucose Monitoring Suppl (ONETOUCH VERIO) w/Device KIT by Does not apply route.     cetirizine (ZYRTEC) 10 MG tablet Take 10 mg by mouth daily.     Dapsone (ACZONE) 7.5 % GEL Apply to face daily 60 g 2   desonide (DESOWEN) 0.05 % ointment Apply to the scalp once daily as needed for itching.     diphenhydrAMINE (BENADRYL) 50 MG tablet Take 50 mg by mouth every 6 (six) hours as needed for itching.     donepezil (ARICEPT) 5 MG tablet Take by mouth.     ELDERBERRY PO Take 1 tablet by mouth daily.     empagliflozin (JARDIANCE) 10 MG TABS tablet Take 1 tablet by mouth daily.     ENBREL SURECLICK 50 MG/ML injection Inject into the skin.      finasteride (PROSCAR) 5 MG tablet      fluocinonide ointment (LIDEX) 0.05 % APPLY OINTMENT TO SCALP 2 TO 3 TIMES PER WEEK **DO NOT APPLY TO FACE**     fluticasone (FLONASE) 50 MCG/ACT nasal spray Place 1 spray into both nostrils 2 (two) times daily as needed (nasal congestion). 16 g 5   folic acid (FOLVITE) 1 MG tablet Take 1 mg by mouth daily.     HYDROcodone-acetaminophen (NORCO/VICODIN) 5-325 MG tablet Take 1 tablet by mouth every 6 (six) hours as needed.     hydrOXYzine (ATARAX/VISTARIL) 25 MG tablet ONE TABLET AT BEDTIME AS NEEDED FOR ITCHING. 30 tablet 3   ibuprofen (ADVIL,MOTRIN) 800 MG tablet Take 1 tablet (800 mg total) by mouth every 8 (eight) hours as needed for mild pain. 30 tablet 0   lisinopril-hydrochlorothiazide (PRINZIDE,ZESTORETIC) 10-12.5 MG tablet Take 0.5 tablets by mouth.      meloxicam (MOBIC) 7.5 MG tablet Take by mouth.     methocarbamol (ROBAXIN) 500 MG tablet Take 500 mg by mouth 3 (three) times daily.     metroNIDAZOLE (METROGEL) 1 % gel Apply to face daily for Rosacea 45 g 3   minoxidil (LONITEN) 2.5 MG tablet SMARTSIG:.5 Tablet(s) By Mouth Daily     NON FORMULARY      Olopatadine HCl 0.2 % SOLN Apply 1 drop to eye daily as needed (itchy/watery eyes). 2.5 mL 5   potassium chloride (KLOR-CON) 10 MEQ tablet Take 10 mEq by mouth 2 (two) times daily.     pregabalin (LYRICA) 100 MG capsule Take by mouth.     traMADol (ULTRAM) 50 MG tablet      ZINC SULFATE ER PO Take by mouth.     Azelastine HCl 0.15 % SOLN May use azelastine nasal spray 1-2 sprays per nostril twice a day as needed for runny nose/drainage. 30 mL 5   EPINEPHrine 0.3 mg/0.3 mL IJ SOAJ injection Inject 0.3 mg into the muscle as needed for anaphylaxis. 1 each 2   famotidine (PEPCID) 20 MG tablet Take 1 tablet (20 mg total) by mouth daily as needed for heartburn or indigestion. 30 tablet 5   levocetirizine (XYZAL) 5 MG tablet Take 1 tablet (5 mg total) by mouth daily as needed for allergies. 90 tablet 1    lisinopril-hydrochlorothiazide (ZESTORETIC) 10-12.5 MG tablet Take by mouth.     omeprazole (PRILOSEC) 20 MG capsule Take 1 capsule (20 mg total)  by mouth daily. 90 capsule 1   SYMBICORT 80-4.5 MCG/ACT inhaler INHALE 1 PUFF BY MOUTH ONCE DAILY RINSE MOUTH AFTER USE 11 g 5   No current facility-administered medications for this visit.   Allergies: Allergies  Allergen Reactions   Iodinated Contrast Media Anaphylaxis    Cardiac Arrest Seizure   Other Hives   Shellfish Allergy Hives   Sulfasalazine Hives   Tetrofosmin [Technetium-2m] Hives and Anaphylaxis   Banana Hives   Cat Hair Extract Other (See Comments)    Hives    Eggs Or Egg-Derived Products Nausea And Vomiting   Haemophilus B Polysaccharide Vaccine Other (See Comments)    OTHER  OTHER     Strawberry Extract Hives   Sulfamethoxazole Rash   Biaxin [Clarithromycin] Hives   Latex Hives   Peanuts [Peanut Oil] Hives   Pravastatin Hives   Shellfish-Derived Products Hives   Sulfa Antibiotics Hives and Rash   I reviewed her past medical history, social history, family history, and environmental history and no significant changes have been reported from her previous visit.  Review of Systems  Constitutional:  Negative for appetite change, chills, fever and unexpected weight change.  HENT:  Negative for congestion, postnasal drip, rhinorrhea and sneezing.   Eyes:  Negative for itching.  Respiratory:  Negative for cough, chest tightness, shortness of breath and wheezing.   Gastrointestinal:  Negative for abdominal pain.  Skin:  Negative for rash.  Allergic/Immunologic: Positive for environmental allergies and food allergies.  Neurological:  Negative for headaches.    Objective: BP 130/84   Pulse 60   Temp (!) 96.8 F (36 C)   Resp 18   Ht 5\' 1"  (1.549 m)   Wt 182 lb (82.6 kg)   SpO2 99%   BMI 34.39 kg/m  Body mass index is 34.39 kg/m. Physical Exam Vitals and nursing note reviewed.  Constitutional:       Appearance: Normal appearance. She is well-developed.  HENT:     Head: Normocephalic and atraumatic.     Right Ear: Tympanic membrane and external ear normal.     Left Ear: Tympanic membrane and external ear normal.     Nose: Nose normal.     Mouth/Throat:     Mouth: Mucous membranes are moist.     Pharynx: Oropharynx is clear.  Eyes:     Conjunctiva/sclera: Conjunctivae normal.  Cardiovascular:     Rate and Rhythm: Normal rate and regular rhythm.     Heart sounds: Normal heart sounds. No murmur heard. Pulmonary:     Effort: Pulmonary effort is normal.     Breath sounds: Normal breath sounds. No wheezing, rhonchi or rales.  Musculoskeletal:     Cervical back: Neck supple.  Skin:    General: Skin is warm.     Findings: No rash.  Neurological:     Mental Status: She is alert and oriented to person, place, and time.  Psychiatric:        Behavior: Behavior normal.   Previous notes and tests were reviewed. The plan was reviewed with the patient/family, and all questions/concerned were addressed.  It was my pleasure to see Gabrielle Shaw today and participate in her care. Please feel free to contact me with any questions or concerns.  Sincerely,  Wyline Mood, DO Allergy & Immunology  [facility] office: [phone] [facility] office: [phone]

## 2022-12-18 ENCOUNTER — Other Ambulatory Visit: Payer: Self-pay

## 2022-12-18 ENCOUNTER — Encounter: Payer: Self-pay | Admitting: Allergy

## 2022-12-18 ENCOUNTER — Other Ambulatory Visit: Payer: Self-pay | Admitting: Allergy

## 2022-12-18 ENCOUNTER — Ambulatory Visit (INDEPENDENT_AMBULATORY_CARE_PROVIDER_SITE_OTHER): Payer: Medicare Other | Admitting: Allergy

## 2022-12-18 VITALS — BP 130/84 | HR 60 | Temp 96.8°F | Resp 18 | Ht 61.0 in | Wt 182.0 lb

## 2022-12-18 DIAGNOSIS — H1013 Acute atopic conjunctivitis, bilateral: Secondary | ICD-10-CM

## 2022-12-18 DIAGNOSIS — J309 Allergic rhinitis, unspecified: Secondary | ICD-10-CM

## 2022-12-18 DIAGNOSIS — T7800XD Anaphylactic reaction due to unspecified food, subsequent encounter: Secondary | ICD-10-CM | POA: Diagnosis not present

## 2022-12-18 DIAGNOSIS — H1010 Acute atopic conjunctivitis, unspecified eye: Secondary | ICD-10-CM

## 2022-12-18 DIAGNOSIS — K219 Gastro-esophageal reflux disease without esophagitis: Secondary | ICD-10-CM

## 2022-12-18 DIAGNOSIS — J4540 Moderate persistent asthma, uncomplicated: Secondary | ICD-10-CM | POA: Diagnosis not present

## 2022-12-18 MED ORDER — ALBUTEROL SULFATE (2.5 MG/3ML) 0.083% IN NEBU: 2.5000 mg | INHALATION_SOLUTION | RESPIRATORY_TRACT | 1 refills | Status: AC | PRN

## 2022-12-18 MED ORDER — ALBUTEROL SULFATE HFA 108 (90 BASE) MCG/ACT IN AERS: 2.0000 | INHALATION_SPRAY | RESPIRATORY_TRACT | 1 refills | Status: DC | PRN

## 2022-12-18 MED ORDER — EPINEPHRINE 0.3 MG/0.3ML IJ SOAJ: 0.3000 mg | INTRAMUSCULAR | 2 refills | Status: DC | PRN

## 2022-12-18 MED ORDER — FAMOTIDINE 20 MG PO TABS
20.0000 mg | ORAL_TABLET | Freq: Every day | ORAL | 5 refills | Status: DC | PRN
Start: 2022-12-18 — End: 2023-06-01

## 2022-12-18 MED ORDER — LEVOCETIRIZINE DIHYDROCHLORIDE 5 MG PO TABS: 5.0000 mg | ORAL_TABLET | Freq: Every day | ORAL | 1 refills | Status: DC | PRN

## 2022-12-18 MED ORDER — OMEPRAZOLE 20 MG PO CPDR: 20.0000 mg | DELAYED_RELEASE_CAPSULE | Freq: Every day | ORAL | 1 refills | Status: DC

## 2022-12-18 MED ORDER — SYMBICORT 80-4.5 MCG/ACT IN AERO: INHALATION_SPRAY | RESPIRATORY_TRACT | 5 refills | Status: DC

## 2022-12-18 MED ORDER — AZELASTINE HCL 0.15 % NA SOLN: NASAL | 5 refills | Status: DC

## 2022-12-18 MED ORDER — FLUTICASONE PROPIONATE 50 MCG/ACT NA SUSP: 1.0000 | Freq: Two times a day (BID) | NASAL | 5 refills | Status: DC | PRN

## 2022-12-18 NOTE — Patient Instructions (Addendum)
Moderate persistent asthma Normal breathing test today. Daily controller medication(s): Symbicort 23mg 1 puff once a day with spacer and rinse mouth afterwards. During upper respiratory infections/asthma flares:  Start Symbicort 1628m 2 puffs twice a day with spacer and rinse mouth afterwards for 1-2 weeks until your breathing symptoms return to baseline.  Pretreat with albuterol 2 puffs or albuterol nebulizer.  If you need to use your albuterol nebulizer machine back to back within 15-30 minutes with no relief then please go to the ER/urgent care for further evaluation.  May use albuterol rescue inhaler 2 puffs every 4 to 6 hours as needed for shortness of breath, chest tightness, coughing, and wheezing. May use albuterol rescue inhaler 2 puffs 5 to 15 minutes prior to strenuous physical activities. Monitor frequency of use.  Asthma control goals:  Full participation in all desired activities (may need albuterol before activity) Albuterol use two times or less a week on average (not counting use with activity) Cough interfering with sleep two times or less a month Oral steroids no more than once a year No hospitalizations   Seasonal and perennial allergic rhinitis Continue environmental control measures. Continue allergy injections - given today. Use Flonase (fluticasone) nasal spray 1 spray per nostril twice a day as needed for nasal congestion.  Use azelastine nasal spray 1-2 sprays per nostril twice a day as needed for runny nose/drainage. ONLY USE the eye drops if needed.  Olopatadine eye drops 0.2% once a day as needed for itchy/watery eyes. You may use over the counter refresh eye drops additionally if needed.  Nasal saline spray (i.e., Simply Saline) or nasal saline lavage (i.e., NeilMed) is recommended as needed and prior to medicated nasal sprays. May use over the counter antihistamines such as Zyrtec (cetirizine), Claritin (loratadine), Allegra (fexofenadine), or Xyzal  (levocetirizine) daily as needed.  Food allergy Continue careful avoidance of egg, peanuts, tree nuts, shellfish, bananas, and strawberries. Okay to eat flounder.  For mild symptoms you can take over the counter antihistamines such as Benadryl and monitor symptoms closely. If symptoms worsen or if you have severe symptoms including breathing issues, throat closure, significant swelling, whole body hives, severe diarrhea and vomiting, lightheadedness then inject epinephrine and seek immediate medical care afterwards. Food allergy action plan in place.   GERD (gastroesophageal reflux disease) Continue appropriate reflux lifestyle modifications. Continue omeprazole '20mg'$  one tablet in the morning. Nothing to eat or drink for 30 minutes afterwards.  May take famotidine '20mg'$  daily as needed.   Follow up in 4 months or sooner if needed.

## 2022-12-18 NOTE — Assessment & Plan Note (Signed)
Only using Symbicort every 1-2 days with no flare in symptoms.  Today's spirometry was normal.  Daily controller medication(s): Symbicort 30mg 1 puff once a day with spacer and rinse mouth afterwards. During upper respiratory infections/asthma flares:  Start Symbicort 1685m 2 puffs twice a day with spacer and rinse mouth afterwards for 1-2 weeks until your breathing symptoms return to baseline.  Pretreat with albuterol 2 puffs or albuterol nebulizer.  If you need to use your albuterol nebulizer machine back to back within 15-30 minutes with no relief then please go to the ER/urgent care for further evaluation.  May use albuterol rescue inhaler 2 puffs every 4 to 6 hours as needed for shortness of breath, chest tightness, coughing, and wheezing. May use albuterol rescue inhaler 2 puffs 5 to 15 minutes prior to strenuous physical activities. Monitor frequency of use.  Get spirometry at next visit. Patient did not want to stop Symbicort completely due to respiratory season -plan to change to Airsupra rescue inhaler at next visit.

## 2022-12-18 NOTE — Assessment & Plan Note (Signed)
Past history - 2018 blood work was negative to egg. Peanuts caused anaphylaxis requiring Epipen. Avoiding tree nuts. Failed egg challenge? Reaction to shrimp in the past. 2022 Blood work was negative to egg, fish, peanuts, tree nuts, strawberry, banana. Borderline positive to shellfish. Tolerates finned fish. Not interested in food challenges.  Continue careful avoidance of egg, peanuts, tree nuts, shellfish, bananas, and strawberries. For mild symptoms you can take over the counter antihistamines such as Benadryl and monitor symptoms closely. If symptoms worsen or if you have severe symptoms including breathing issues, throat closure, significant swelling, whole body hives, severe diarrhea and vomiting, lightheadedness then inject epinephrine and seek immediate medical care afterwards. Food allergy action plan in place.

## 2022-12-18 NOTE — Assessment & Plan Note (Signed)
Past history - on AIT (cat-tree-grass + mite-weed). Tried to stop before but restarted due to worsening symptoms.  Interim history - controlled.  Continue environmental control measures. Continue allergy injections - given today. Use Flonase (fluticasone) nasal spray 1 spray per nostril twice a day as needed for nasal congestion.  Use azelastine nasal spray 1-2 sprays per nostril twice a day as needed for runny nose/drainage. ONLY USE the eye drops if needed.  Olopatadine eye drops 0.2% once a day as needed for itchy/watery eyes. You may use over the counter refresh eye drops additionally if needed.  Nasal saline spray (i.e., Simply Saline) or nasal saline lavage (i.e., NeilMed) is recommended as needed and prior to medicated nasal sprays. May use over the counter antihistamines such as Zyrtec (cetirizine), Claritin (loratadine), Allegra (fexofenadine), or Xyzal (levocetirizine) daily as needed.

## 2022-12-18 NOTE — Assessment & Plan Note (Signed)
Controlled.  Continue appropriate reflux lifestyle modifications. Continue omeprazole '20mg'$  one tablet in the morning. Nothing to eat or drink for 30 minutes afterwards.  May take famotidine '20mg'$  daily as needed.

## 2022-12-19 ENCOUNTER — Telehealth: Payer: Self-pay

## 2022-12-19 MED ORDER — TRIAMCINOLONE ACETONIDE 0.1 % EX OINT: 1.0000 | TOPICAL_OINTMENT | Freq: Two times a day (BID) | CUTANEOUS | 0 refills | Status: DC

## 2022-12-19 NOTE — Addendum Note (Signed)
Addended by: Isabel Caprice on: 12/19/2022 10:10 AM   Modules accepted: Orders

## 2022-12-19 NOTE — Telephone Encounter (Signed)
This is a Dr. [name] patient. Patient called and stated her arms were swollen the size of an egg, they are down now. She also had a rash on both arms. She wants to know if she can have something prescribed for itching. Left arm is still tight and swollen.  [employer] [phone]

## 2022-12-19 NOTE — Telephone Encounter (Signed)
Yes of course.  Can we send in triamcinolone 0.1% and have her use it BID until rash goes down? She can also take an extra cetirizine today and daily until the rash has resolved or is resolving. I would also have her ice both arms to help with swelling.

## 2022-12-19 NOTE — Telephone Encounter (Signed)
Called and informed patient  Gabrielle Shaw [phone]

## 2022-12-23 ENCOUNTER — Ambulatory Visit (INDEPENDENT_AMBULATORY_CARE_PROVIDER_SITE_OTHER): Payer: Medicare Other | Admitting: *Deleted

## 2022-12-23 DIAGNOSIS — J309 Allergic rhinitis, unspecified: Secondary | ICD-10-CM

## 2023-01-01 ENCOUNTER — Ambulatory Visit (INDEPENDENT_AMBULATORY_CARE_PROVIDER_SITE_OTHER): Payer: 59

## 2023-01-01 DIAGNOSIS — J309 Allergic rhinitis, unspecified: Secondary | ICD-10-CM

## 2023-01-06 ENCOUNTER — Ambulatory Visit (INDEPENDENT_AMBULATORY_CARE_PROVIDER_SITE_OTHER): Payer: 59

## 2023-01-06 DIAGNOSIS — J309 Allergic rhinitis, unspecified: Secondary | ICD-10-CM | POA: Diagnosis not present

## 2023-01-08 ENCOUNTER — Telehealth: Payer: Self-pay

## 2023-01-08 ENCOUNTER — Other Ambulatory Visit (HOSPITAL_COMMUNITY): Payer: Self-pay

## 2023-01-08 NOTE — Telephone Encounter (Signed)
Patient Advocate Encounter   Received notification from [employer] Medicare Part D that prior authorization is required for Azelastine HCl 0.15% solution   Submitted: 01-08-2023 Key [id number]  Status is pending

## 2023-01-12 ENCOUNTER — Other Ambulatory Visit (HOSPITAL_COMMUNITY): Payer: Self-pay

## 2023-01-12 NOTE — Telephone Encounter (Signed)
Called [employer]/[phone] - spoke to [place]- DOB verified - obtainedthe correct Schlater to use: NDC# [id number].  Called [facility] [address] [phone] spoke to Baxter Flattery, Pharmacist - DOB verified - informed NDC# given to me is no longer available as prescription - available over the counter much cheaper.  Called patient - DOB verified - advised of the above notation. Patient advised she already knew nasal spray was no longer covered by her insurance - she has been purchasing nasal spray over the counter.  Apologized to patient for the call - she verbalized understanding, no further questions.

## 2023-01-12 NOTE — Telephone Encounter (Signed)
.  Patient Advocate Encounter   Received notification from [employer] regarding prior authorization for Azelastine Spr 0.15%  This prior authorization was CANCELLED because this medication or product is on your plan's list of covered drugs. Prior authorization is not required at this time. The requested medication with the submitted National Drug Code [facility]) is not properly listed with the Food and Drug Administration (FDA) and does not meet regulatory requirements that would constitute a Part D-eligible drug.

## 2023-02-05 ENCOUNTER — Ambulatory Visit (INDEPENDENT_AMBULATORY_CARE_PROVIDER_SITE_OTHER): Payer: 59

## 2023-02-05 DIAGNOSIS — J309 Allergic rhinitis, unspecified: Secondary | ICD-10-CM | POA: Diagnosis not present

## 2023-03-05 ENCOUNTER — Ambulatory Visit (INDEPENDENT_AMBULATORY_CARE_PROVIDER_SITE_OTHER): Payer: 59

## 2023-03-05 DIAGNOSIS — J309 Allergic rhinitis, unspecified: Secondary | ICD-10-CM | POA: Diagnosis not present

## 2023-03-09 ENCOUNTER — Other Ambulatory Visit: Payer: Self-pay | Admitting: Internal Medicine

## 2023-03-12 ENCOUNTER — Telehealth: Payer: Self-pay

## 2023-03-12 MED ORDER — TRIAMCINOLONE ACETONIDE 0.1 % EX OINT: 1.0000 | TOPICAL_OINTMENT | Freq: Two times a day (BID) | CUTANEOUS | 0 refills | Status: DC

## 2023-03-12 NOTE — Progress Notes (Signed)
VIALS EXP 03-11-24

## 2023-03-12 NOTE — Telephone Encounter (Signed)
Gabrielle Shaw called stating she needed a refill on her triamcinolone ointment. Sent in a refill into the [facility] in [place].  [employer] [phone]

## 2023-03-13 DIAGNOSIS — J3081 Allergic rhinitis due to animal (cat) (dog) hair and dander: Secondary | ICD-10-CM | POA: Diagnosis not present

## 2023-03-16 DIAGNOSIS — J3089 Other allergic rhinitis: Secondary | ICD-10-CM | POA: Diagnosis not present

## 2023-04-09 ENCOUNTER — Ambulatory Visit (INDEPENDENT_AMBULATORY_CARE_PROVIDER_SITE_OTHER): Payer: 59

## 2023-04-09 DIAGNOSIS — J309 Allergic rhinitis, unspecified: Secondary | ICD-10-CM | POA: Diagnosis not present

## 2023-05-07 ENCOUNTER — Ambulatory Visit (INDEPENDENT_AMBULATORY_CARE_PROVIDER_SITE_OTHER): Payer: 59

## 2023-05-07 DIAGNOSIS — J309 Allergic rhinitis, unspecified: Secondary | ICD-10-CM

## 2023-06-01 ENCOUNTER — Other Ambulatory Visit: Payer: Self-pay | Admitting: Allergy

## 2023-06-11 ENCOUNTER — Telehealth: Payer: Self-pay

## 2023-06-11 ENCOUNTER — Ambulatory Visit (INDEPENDENT_AMBULATORY_CARE_PROVIDER_SITE_OTHER): Payer: 59

## 2023-06-11 DIAGNOSIS — J309 Allergic rhinitis, unspecified: Secondary | ICD-10-CM | POA: Diagnosis not present

## 2023-06-11 NOTE — Telephone Encounter (Signed)
Can patient build up on 1,3,5 for her injections? She hasn't had any issues and she's every 4 weeks.  [employer] [phone]

## 2023-06-11 NOTE — Telephone Encounter (Signed)
Documented in flowsheet.  

## 2023-06-11 NOTE — Telephone Encounter (Signed)
Yes. Okay to change build up schedule.

## 2023-06-14 ENCOUNTER — Other Ambulatory Visit: Payer: Self-pay | Admitting: Allergy

## 2023-06-16 ENCOUNTER — Ambulatory Visit (INDEPENDENT_AMBULATORY_CARE_PROVIDER_SITE_OTHER): Payer: 59

## 2023-06-16 DIAGNOSIS — J309 Allergic rhinitis, unspecified: Secondary | ICD-10-CM

## 2023-06-23 ENCOUNTER — Ambulatory Visit (INDEPENDENT_AMBULATORY_CARE_PROVIDER_SITE_OTHER): Payer: 59

## 2023-06-23 DIAGNOSIS — J309 Allergic rhinitis, unspecified: Secondary | ICD-10-CM

## 2023-07-09 ENCOUNTER — Ambulatory Visit: Payer: Medicare Other | Admitting: Dermatology

## 2023-08-06 ENCOUNTER — Ambulatory Visit (INDEPENDENT_AMBULATORY_CARE_PROVIDER_SITE_OTHER): Payer: 59

## 2023-08-06 DIAGNOSIS — J309 Allergic rhinitis, unspecified: Secondary | ICD-10-CM

## 2023-08-10 ENCOUNTER — Ambulatory Visit: Payer: Medicare Other | Admitting: Dermatology

## 2023-09-10 ENCOUNTER — Ambulatory Visit (INDEPENDENT_AMBULATORY_CARE_PROVIDER_SITE_OTHER): Payer: 59

## 2023-09-10 DIAGNOSIS — J309 Allergic rhinitis, unspecified: Secondary | ICD-10-CM

## 2023-09-14 NOTE — Progress Notes (Unsigned)
Follow Up Note  RE: Gabrielle Shaw MRN: [id number] DOB: Sep 05, 1961 Date of Office Visit: 09/15/2023  Referring provider: Harvie Heck, MD Primary care provider: Harvie Heck, MD  Chief Complaint: No chief complaint on file.  History of Present Illness: I had the pleasure of seeing Gabrielle Shaw for a follow up visit at the [facility] on 09/14/2023. She is a 62 y.o. female, who is being followed for asthma, allergic rhinoconjunctivitis on AIT, food allergy, GERD. Her previous allergy office visit was on 12/18/2022 with Dr. Selena Shaw. Today is a regular follow up visit.  Discussed the use of AI scribe software for clinical note transcription with the patient, who gave verbal consent to proceed.  History of Present Illness            Moderate persistent asthma without complication Only using Symbicort every 1-2 days with no flare in symptoms.  Today's spirometry was normal.  Daily controller medication(s): Symbicort 1 puff once a day with spacer and rinse mouth afterwards. During upper respiratory infections/asthma flares:  Start Symbicort 2 puffs twice a day with spacer and rinse mouth afterwards for 1-2 weeks until your breathing symptoms return to baseline.  Pretreat with albuterol 2 puffs or albuterol nebulizer.  If you need to use your albuterol nebulizer machine back to back within 15-30 minutes with no relief then please go to the ER/urgent care for further evaluation.  May use albuterol rescue inhaler 2 puffs every 4 to 6 hours as needed for shortness of breath, chest tightness, coughing, and wheezing. May use albuterol rescue inhaler 2 puffs 5 to 15 minutes prior to strenuous physical activities. Monitor frequency of use.  Get spirometry at next visit. Patient did not want to stop Symbicort completely due to respiratory season -plan to change to Airsupra rescue inhaler at next visit.    Seasonal and perennial allergic rhinoconjunctivitis Past  history - on AIT (cat-tree-grass + mite-weed). Tried to stop before but restarted due to worsening symptoms.  Interim history - controlled.  Continue environmental control measures. Continue allergy injections - given today. Use Flonase (fluticasone) nasal spray 1 spray per nostril twice a day as needed for nasal congestion.  Use azelastine nasal spray 1-2 sprays per nostril twice a day as needed for runny nose/drainage. ONLY USE the eye drops if needed.  Olopatadine eye drops 0.2% once a day as needed for itchy/watery eyes. You may use over the counter refresh eye drops additionally if needed.  Nasal saline spray (i.e., Simply Saline) or nasal saline lavage (i.e., NeilMed) is recommended as needed and prior to medicated nasal sprays. May use over the counter antihistamines such as Zyrtec (cetirizine), Claritin (loratadine), Allegra (fexofenadine), or Xyzal (levocetirizine) daily as needed.   Gastroesophageal reflux disease Controlled.  Continue appropriate reflux lifestyle modifications. Continue omeprazole 20mg  one tablet in the morning. Nothing to eat or drink for 30 minutes afterwards.  May take famotidine 20mg  daily as needed.    Anaphylactic reaction due to food, subsequent encounter Past history - 2018 blood work was negative to egg. Peanuts caused anaphylaxis requiring Epipen. Avoiding tree nuts. Failed egg challenge? Reaction to shrimp in the past. 2022 Blood work was negative to egg, fish, peanuts, tree nuts, strawberry, banana. Borderline positive to shellfish. Tolerates finned fish. Not interested in food challenges.  Continue careful avoidance of egg, peanuts, tree nuts, shellfish, bananas, and strawberries. For mild symptoms you can take over the counter antihistamines such as Benadryl and monitor symptoms closely. If  symptoms worsen or if you have severe symptoms including breathing issues, throat closure, significant swelling, whole body hives, severe diarrhea and vomiting,  lightheadedness then inject epinephrine and seek immediate medical care afterwards. Food allergy action plan in place.  Assessment and Plan: Gabrielle Shaw is a 62 y.o. female with: Seasonal allergic rhinitis due to pollen Allergic rhinitis due to dust mite Allergic conjunctivitis of both eyes Past history - on AIT (cat-tree-grass + mite-weed). Tried to stop before but restarted due to worsening symptoms.  Interim history -   Moderate persistent asthma without complication ***  Anaphylactic reaction due to food, subsequent encounter Past history - 2018 blood work was negative to egg. Peanuts caused anaphylaxis requiring Epipen. Avoiding tree nuts. Failed egg challenge? Reaction to shrimp in the past. 2022 Blood work was negative to egg, fish, peanuts, tree nuts, strawberry, banana. Borderline positive to shellfish. Tolerates finned fish. Not interested in food challenges.   Gastroesophageal reflux disease, unspecified whether esophagitis present ***  Assessment and Plan              No follow-ups on file.  No orders of the defined types were placed in this encounter.  Lab Orders  No laboratory test(s) ordered today    Diagnostics: Spirometry:  Tracings reviewed. Her effort: {Blank single:19197::"Good reproducible efforts.","It was hard to get consistent efforts and there is a question as to whether this reflects a maximal maneuver.","Poor effort, data can not be interpreted."} FVC: ***L FEV1: ***L, ***% predicted FEV1/FVC ratio: ***% Interpretation: {Blank single:19197::"Spirometry consistent with mild obstructive disease","Spirometry consistent with moderate obstructive disease","Spirometry consistent with severe obstructive disease","Spirometry consistent with possible restrictive disease","Spirometry consistent with mixed obstructive and restrictive disease","Spirometry uninterpretable due to technique","Spirometry consistent with normal pattern","No overt abnormalities noted  given today's efforts"}.  Please see scanned spirometry results for details.  Skin Testing: {Blank single:19197::"Select foods","Environmental allergy panel","Environmental allergy panel and select foods","Food allergy panel","None","Deferred due to recent antihistamines use"}. *** Results discussed with patient/family.   Medication List:  Current Outpatient Medications  Medication Sig Dispense Refill  . albuterol (PROVENTIL) (2.5 MG/3ML) 0.083% nebulizer solution Take 3 mLs (2.5 mg total) by nebulization every 4 (four) hours as needed for wheezing or shortness of breath (coughing fits). 75 mL 1  . albuterol (VENTOLIN HFA) 108 (90 Base) MCG/ACT inhaler INHALE 2 PUFFS BY MOUTH EVERY 4 HOURS AS NEEDED FOR WHEEZING FOR SHORTNESS OF BREATH 18 g 1  . aspirin EC 81 MG tablet Take 81 mg by mouth as needed.     . Azelastine HCl 0.15 % SOLN May use azelastine nasal spray 1-2 sprays per nostril twice a day as needed for runny nose/drainage. 30 mL 5  . Blood Glucose Monitoring Suppl (ONETOUCH VERIO) w/Device KIT by Does not apply route.    . cetirizine (ZYRTEC) 10 MG tablet Take 10 mg by mouth daily.    . Dapsone (ACZONE) 7.5 % GEL Apply to face daily 60 g 2  . desonide (DESOWEN) 0.05 % ointment Apply to the scalp once daily as needed for itching.    . diphenhydrAMINE (BENADRYL) 50 MG tablet Take 50 mg by mouth every 6 (six) hours as needed for itching.    . donepezil (ARICEPT) 5 MG tablet Take by mouth.    . ELDERBERRY PO Take 1 tablet by mouth daily.    . empagliflozin (JARDIANCE) 10 MG TABS tablet Take 1 tablet by mouth daily.    Elgie Collard SURECLICK 50 MG/ML injection Inject into the skin.    Marland Kitchen EPINEPHrine 0.3 mg/0.3  mL IJ SOAJ injection Inject 0.3 mg into the muscle as needed for anaphylaxis. 1 each 2  . famotidine (PEPCID) 20 MG tablet TAKE 1 TABLET BY MOUTH ONCE DAILY AS NEEDED HEARTBURN  OR  INDIGESTION 30 tablet 0  . finasteride (PROSCAR) 5 MG tablet     . fluocinonide ointment (LIDEX) 0.05 %  APPLY OINTMENT TO SCALP 2 TO 3 TIMES PER WEEK **DO NOT APPLY TO FACE**    . fluticasone (FLONASE) 50 MCG/ACT nasal spray Place 1 spray into both nostrils 2 (two) times daily as needed (nasal congestion). 16 g 5  . folic acid (FOLVITE) 1 MG tablet Take 1 mg by mouth daily.    Marland Kitchen HYDROcodone-acetaminophen (NORCO/VICODIN) 5-325 MG tablet Take 1 tablet by mouth every 6 (six) hours as needed.    . hydrOXYzine (ATARAX/VISTARIL) 25 MG tablet ONE TABLET AT BEDTIME AS NEEDED FOR ITCHING. 30 tablet 3  . ibuprofen (ADVIL,MOTRIN) 800 MG tablet Take 1 tablet (800 mg total) by mouth every 8 (eight) hours as needed for mild pain. 30 tablet 0  . levocetirizine (XYZAL) 5 MG tablet Take 1 tablet (5 mg total) by mouth daily as needed for allergies. 90 tablet 1  . lisinopril-hydrochlorothiazide (PRINZIDE,ZESTORETIC) 10-12.5 MG tablet Take 0.5 tablets by mouth.     Marland Kitchen lisinopril-hydrochlorothiazide (ZESTORETIC) 10-12.5 MG tablet Take by mouth.    . meloxicam (MOBIC) 7.5 MG tablet Take by mouth.    . methocarbamol (ROBAXIN) 500 MG tablet Take 500 mg by mouth 3 (three) times daily.    . metroNIDAZOLE (METROGEL) 1 % gel Apply to face daily for Rosacea 45 g 3  . minoxidil (LONITEN) 2.5 MG tablet SMARTSIG:.5 Tablet(s) By Mouth Daily    . NON FORMULARY     . Olopatadine HCl 0.2 % SOLN Apply 1 drop to eye daily as needed (itchy/watery eyes). 2.5 mL 5  . omeprazole (PRILOSEC) 20 MG capsule Take 1 capsule (20 mg total) by mouth daily. 90 capsule 1  . potassium chloride (KLOR-CON) 10 MEQ tablet Take 10 mEq by mouth 2 (two) times daily.    . pregabalin (LYRICA) 100 MG capsule Take by mouth.    . SYMBICORT 80-4.5 MCG/ACT inhaler INHALE 1 PUFF BY MOUTH ONCE DAILY RINSE MOUTH AFTER USE 11 g 5  . traMADol (ULTRAM) 50 MG tablet     . triamcinolone ointment (KENALOG) 0.1 % Apply 1 Application topically 2 (two) times daily. 30 g 0  . ZINC SULFATE ER PO Take by mouth.     No current facility-administered medications for this visit.    Allergies: Allergies  Allergen Reactions  . Iodinated Contrast Media Anaphylaxis    Cardiac Arrest Seizure  . Other Hives  . Shellfish Allergy Hives  . Sulfasalazine Hives  . Tetrofosmin [Technetium-15m] Hives and Anaphylaxis  . Banana Hives  . Cat Hair Extract Other (See Comments)    Hives   . Egg-Derived Products Nausea And Vomiting  . Haemophilus B Polysaccharide Vaccine Other (See Comments)    OTHER  OTHER    . Strawberry Extract Hives  . Sulfamethoxazole Rash  . Biaxin [Clarithromycin] Hives  . Latex Hives  . Peanuts [Peanut Oil] Hives  . Pravastatin Hives  . Shellfish-Derived Products Hives  . Sulfa Antibiotics Hives and Rash   I reviewed her past medical history, social history, family history, and environmental history and no significant changes have been reported from her previous visit.  Review of Systems  Constitutional:  Negative for appetite change, chills, fever and unexpected weight  change.  HENT:  Negative for congestion, postnasal drip, rhinorrhea and sneezing.   Eyes:  Negative for itching.  Respiratory:  Negative for cough, chest tightness, shortness of breath and wheezing.   Gastrointestinal:  Negative for abdominal pain.  Skin:  Negative for rash.  Allergic/Immunologic: Positive for environmental allergies and food allergies.  Neurological:  Negative for headaches.   Objective: There were no vitals taken for this visit. There is no height or weight on file to calculate BMI. Physical Exam Vitals and nursing note reviewed.  Constitutional:      Appearance: Normal appearance. She is well-developed.  HENT:     Head: Normocephalic and atraumatic.     Right Ear: Tympanic membrane and external ear normal.     Left Ear: Tympanic membrane and external ear normal.     Nose: Nose normal.     Mouth/Throat:     Mouth: Mucous membranes are moist.     Pharynx: Oropharynx is clear.  Eyes:     Conjunctiva/sclera: Conjunctivae normal.  Cardiovascular:      Rate and Rhythm: Normal rate and regular rhythm.     Heart sounds: Normal heart sounds. No murmur heard. Pulmonary:     Effort: Pulmonary effort is normal.     Breath sounds: Normal breath sounds. No wheezing, rhonchi or rales.  Musculoskeletal:     Cervical back: Neck supple.  Skin:    General: Skin is warm.     Findings: No rash.  Neurological:     Mental Status: She is alert and oriented to person, place, and time.  Psychiatric:        Behavior: Behavior normal.  Previous notes and tests were reviewed. The plan was reviewed with the patient/family, and all questions/concerned were addressed.  It was my pleasure to see Gabrielle Shaw today and participate in her care. Please feel free to contact me with any questions or concerns.  Sincerely,  Wyline Mood, DO Allergy & Immunology  [facility] office: [phone] [facility] office: [phone]

## 2023-09-15 ENCOUNTER — Encounter: Payer: Self-pay | Admitting: Allergy

## 2023-09-15 ENCOUNTER — Ambulatory Visit (INDEPENDENT_AMBULATORY_CARE_PROVIDER_SITE_OTHER): Payer: 59 | Admitting: Allergy

## 2023-09-15 VITALS — BP 140/70 | HR 56 | Temp 97.9°F | Resp 18 | Ht 61.0 in | Wt 178.8 lb

## 2023-09-15 DIAGNOSIS — J3089 Other allergic rhinitis: Secondary | ICD-10-CM

## 2023-09-15 DIAGNOSIS — H1013 Acute atopic conjunctivitis, bilateral: Secondary | ICD-10-CM | POA: Diagnosis not present

## 2023-09-15 DIAGNOSIS — J4540 Moderate persistent asthma, uncomplicated: Secondary | ICD-10-CM

## 2023-09-15 DIAGNOSIS — T7800XD Anaphylactic reaction due to unspecified food, subsequent encounter: Secondary | ICD-10-CM

## 2023-09-15 DIAGNOSIS — J301 Allergic rhinitis due to pollen: Secondary | ICD-10-CM

## 2023-09-15 DIAGNOSIS — K219 Gastro-esophageal reflux disease without esophagitis: Secondary | ICD-10-CM

## 2023-09-15 DIAGNOSIS — R030 Elevated blood-pressure reading, without diagnosis of hypertension: Secondary | ICD-10-CM

## 2023-09-15 MED ORDER — BUDESONIDE-FORMOTEROL FUMARATE 80-4.5 MCG/ACT IN AERO: INHALATION_SPRAY | RESPIRATORY_TRACT | 5 refills | Status: DC

## 2023-09-15 MED ORDER — FLUTICASONE PROPIONATE 50 MCG/ACT NA SUSP: 1.0000 | Freq: Every day | NASAL | 5 refills | Status: DC | PRN

## 2023-09-15 MED ORDER — EPINEPHRINE 0.3 MG/0.3ML IJ SOAJ: 0.3000 mg | INTRAMUSCULAR | 1 refills | Status: DC | PRN

## 2023-09-15 NOTE — Patient Instructions (Addendum)
Moderate persistent asthma Normal breathing test today. Daily controller medication(s): Symbicort 1 puff 1-2 times per day with spacer and rinse mouth afterwards. During respiratory infections/asthma flares:  Start Symbicort 2 puffs twice a day with spacer and rinse mouth afterwards for 1-2 weeks until your breathing symptoms return to baseline.  Pretreat with albuterol 2 puffs or albuterol nebulizer.  If you need to use your albuterol nebulizer machine back to back within 15-30 minutes with no relief then please go to the ER/urgent care for further evaluation.  May use albuterol rescue inhaler 2 puffs or nebulizer every 4 to 6 hours as needed for shortness of breath, chest tightness, coughing, and wheezing. Monitor frequency of use - if you need to use it more than twice per week on a consistent basis let us know.  Asthma control goals:  Full participation in all desired activities (may need albuterol before activity) Albuterol use two times or less a week on average (not counting use with activity) Cough interfering with sleep two times or less a month Oral steroids no more than once a year No hospitalizations   Seasonal and perennial allergic rhinitis Continue environmental control measures. Continue allergy injections. Use Flonase (fluticasone) nasal spray 1 spray per nostril twice a day as needed for nasal congestion.  Use azelastine nasal spray 1-2 sprays per nostril twice a day as needed for runny nose/drainage. ONLY USE the eye drops if needed.  You may use over the counter refresh eye drops additionally if needed.  Nasal saline spray (i.e., Simply Saline) or nasal saline lavage (i.e., NeilMed) is recommended as needed and prior to medicated nasal sprays. May use over the counter antihistamines such as Zyrtec (cetirizine), Claritin (loratadine), Allegra (fexofenadine), or Xyzal (levocetirizine) daily as needed.  Food allergy Continue careful avoidance of egg, peanuts,  tree nuts, shellfish, bananas, and strawberries. Okay to eat flounder.  For mild symptoms you can take over the counter antihistamines such as Benadryl 1-2 tablets = 25-50mg  and monitor symptoms closely. If symptoms worsen or if you have severe symptoms including breathing issues, throat closure, significant swelling, whole body hives, severe diarrhea and vomiting, lightheadedness then inject epinephrine and seek immediate medical care afterwards. Emergency action plan in place.    GERD (gastroesophageal reflux disease) Continue appropriate reflux lifestyle modifications. Continue omeprazole 20mg  one tablet in the morning. Nothing to eat or drink for 30 minutes afterwards.  May take famotidine 20mg  daily as needed.   Follow up in 6 months or sooner if needed.   Elevated blood pressure  Blood pressure reading was high in our office today. Vitals:   09/15/23 1147  BP: (!) 140/72  Please follow up with PCP regarding this.

## 2023-10-08 ENCOUNTER — Ambulatory Visit (INDEPENDENT_AMBULATORY_CARE_PROVIDER_SITE_OTHER): Payer: 59

## 2023-10-08 DIAGNOSIS — J309 Allergic rhinitis, unspecified: Secondary | ICD-10-CM | POA: Diagnosis not present

## 2023-10-12 ENCOUNTER — Other Ambulatory Visit: Payer: Self-pay | Admitting: Allergy

## 2023-10-14 ENCOUNTER — Other Ambulatory Visit: Payer: Self-pay | Admitting: Allergy

## 2023-11-10 ENCOUNTER — Ambulatory Visit (INDEPENDENT_AMBULATORY_CARE_PROVIDER_SITE_OTHER): Payer: 59

## 2023-11-10 DIAGNOSIS — J309 Allergic rhinitis, unspecified: Secondary | ICD-10-CM | POA: Diagnosis not present

## 2023-12-04 ENCOUNTER — Other Ambulatory Visit: Payer: Self-pay | Admitting: Allergy

## 2023-12-05 ENCOUNTER — Other Ambulatory Visit: Payer: Self-pay | Admitting: Allergy

## 2023-12-08 ENCOUNTER — Ambulatory Visit (INDEPENDENT_AMBULATORY_CARE_PROVIDER_SITE_OTHER): Payer: 59

## 2023-12-08 DIAGNOSIS — J309 Allergic rhinitis, unspecified: Secondary | ICD-10-CM

## 2023-12-10 ENCOUNTER — Ambulatory Visit: Payer: 59

## 2023-12-10 ENCOUNTER — Other Ambulatory Visit: Payer: Self-pay

## 2023-12-10 DIAGNOSIS — Z23 Encounter for immunization: Secondary | ICD-10-CM | POA: Diagnosis not present

## 2023-12-10 MED ORDER — OMEPRAZOLE 20 MG PO CPDR: 20.0000 mg | DELAYED_RELEASE_CAPSULE | Freq: Every day | ORAL | 0 refills | Status: DC

## 2023-12-14 DIAGNOSIS — J3081 Allergic rhinitis due to animal (cat) (dog) hair and dander: Secondary | ICD-10-CM | POA: Diagnosis not present

## 2023-12-14 NOTE — Progress Notes (Signed)
VIAL EXP 12-13-24

## 2023-12-15 DIAGNOSIS — J3089 Other allergic rhinitis: Secondary | ICD-10-CM | POA: Diagnosis not present

## 2024-01-05 ENCOUNTER — Ambulatory Visit (INDEPENDENT_AMBULATORY_CARE_PROVIDER_SITE_OTHER): Payer: 59

## 2024-01-05 DIAGNOSIS — J309 Allergic rhinitis, unspecified: Secondary | ICD-10-CM | POA: Diagnosis not present

## 2024-02-09 ENCOUNTER — Ambulatory Visit (INDEPENDENT_AMBULATORY_CARE_PROVIDER_SITE_OTHER): Payer: 59

## 2024-02-09 DIAGNOSIS — J309 Allergic rhinitis, unspecified: Secondary | ICD-10-CM

## 2024-03-08 ENCOUNTER — Ambulatory Visit (INDEPENDENT_AMBULATORY_CARE_PROVIDER_SITE_OTHER)

## 2024-03-08 DIAGNOSIS — J309 Allergic rhinitis, unspecified: Secondary | ICD-10-CM | POA: Diagnosis not present

## 2024-03-14 NOTE — Progress Notes (Unsigned)
 Follow Up Note  RE: FRANCESA EUGENIO MRN: [id number] DOB: 1961/04/03 Date of Office Visit: 03/15/2024  Referring provider: Harvie Heck, MD Primary care provider: Harvie Heck, MD  Chief Complaint: No chief complaint on file.  History of Present Illness: I had the pleasure of seeing Rosemarie Galvis for a follow up visit at the [facility] on 03/14/2024. She is a 63 y.o. female, who is being followed for allergic rhinoconjunctivitis on AIT, asthma, food allergy, GERD. Her previous allergy office visit was on 09/15/2023 with Dr. Selena Batten. Today is a regular follow up visit.  Discussed the use of AI scribe software for clinical note transcription with the patient, who gave verbal consent to proceed.  History of Present Illness            ***  Assessment and Plan: Hideko is a 63 y.o. female with: Seasonal allergic rhinitis due to pollen Allergic rhinitis due to dust mite Allergic conjunctivitis of both eyes Past history - on AIT (cat-tree-grass + mite-weed). Tried to stop before but restarted due to worsening symptoms.  Interim history - stable.  Continue environmental control measures. Continue allergy injections. Use Flonase (fluticasone) nasal spray 1 spray per nostril twice a day as needed for nasal congestion.  Use azelastine nasal spray 1-2 sprays per nostril twice a day as needed for runny nose/drainage. ONLY USE the eye drops (Bepreve) if needed.  You may use over the counter refresh eye drops additionally if needed.  Nasal saline spray (i.e., Simply Saline) or nasal saline lavage (i.e., NeilMed) is recommended as needed and prior to medicated nasal sprays. May use over the counter antihistamines such as Zyrtec (cetirizine), Claritin (loratadine), Allegra (fexofenadine), or Xyzal (levocetirizine) daily as needed.   Moderate persistent asthma without complication Controlled with Symbicort. No prednisone use.  Normal breathing test today. Daily controller  medication(s): Symbicort 1 puff 1-2 times per day with spacer and rinse mouth afterwards. During respiratory infections/asthma flares:  Start Symbicort 2 puffs twice a day with spacer and rinse mouth afterwards for 1-2 weeks until your breathing symptoms return to baseline.  Pretreat with albuterol 2 puffs or albuterol nebulizer.  If you need to use your albuterol nebulizer machine back to back within 15-30 minutes with no relief then please go to the ER/urgent care for further evaluation.  May use albuterol rescue inhaler 2 puffs or nebulizer every 4 to 6 hours as needed for shortness of breath, chest tightness, coughing, and wheezing. Monitor frequency of use - if you need to use it more than twice per week on a consistent basis let us know.    Anaphylactic reaction due to food, subsequent encounter Past history - 2018 blood work was negative to egg. Peanuts caused anaphylaxis requiring Epipen. Avoiding tree nuts. Failed egg challenge? Reaction to shrimp in the past. 2022 Blood work was negative to egg, fish, peanuts, tree nuts, strawberry, banana. Borderline positive to shellfish. Tolerates finned fish. Not interested in food challenges.  Continue careful avoidance of egg, peanuts, tree nuts, shellfish, bananas, and strawberries. Okay to eat flounder.  For mild symptoms you can take over the counter antihistamines such as Benadryl 1-2 tablets = 25-50mg  and monitor symptoms closely. If symptoms worsen or if you have severe symptoms including breathing issues, throat closure, significant swelling, whole body hives, severe diarrhea and vomiting, lightheadedness then inject epinephrine and seek immediate medical care afterwards. Emergency action plan in place.    Gastroesophageal reflux disease, unspecified whether esophagitis present Continue  appropriate reflux lifestyle modifications. Continue omeprazole 20mg  one tablet in the morning. Nothing to eat or drink for 30 minutes afterwards.   May take famotidine 20mg  daily as needed.  Assessment and Plan              No follow-ups on file.  No orders of the defined types were placed in this encounter.  Lab Orders  No laboratory test(s) ordered today    Diagnostics: Spirometry:  Tracings reviewed. Her effort: {Blank single:19197::"Good reproducible efforts.","It was hard to get consistent efforts and there is a question as to whether this reflects a maximal maneuver.","Poor effort, data can not be interpreted."} FVC: ***L FEV1: ***L, ***% predicted FEV1/FVC ratio: ***% Interpretation: {Blank single:19197::"Spirometry consistent with mild obstructive disease","Spirometry consistent with moderate obstructive disease","Spirometry consistent with severe obstructive disease","Spirometry consistent with possible restrictive disease","Spirometry consistent with mixed obstructive and restrictive disease","Spirometry uninterpretable due to technique","Spirometry consistent with normal pattern","No overt abnormalities noted given today's efforts"}.  Please see scanned spirometry results for details.  Skin Testing: {Blank single:19197::"Select foods","Environmental allergy panel","Environmental allergy panel and select foods","Food allergy panel","None","Deferred due to recent antihistamines use"}. *** Results discussed with patient/family.   Medication List:  Current Outpatient Medications  Medication Sig Dispense Refill  . albuterol (PROVENTIL) (2.5 MG/3ML) 0.083% nebulizer solution Take 3 mLs (2.5 mg total) by nebulization every 4 (four) hours as needed for wheezing or shortness of breath (coughing fits). 75 mL 1  . albuterol (VENTOLIN HFA) 108 (90 Base) MCG/ACT inhaler INHALE 2 PUFFS BY MOUTH EVERY 4 HOURS AS NEEDED FOR WHEEZING FOR SHORTNESS OF BREATH 18 g 1  . aspirin EC 81 MG tablet Take 81 mg by mouth as needed.     . Azelastine HCl 0.15 % SOLN May use azelastine nasal spray 1-2 sprays per nostril twice a day as needed  for runny nose/drainage. 30 mL 5  . Blood Glucose Monitoring Suppl (ONETOUCH VERIO) w/Device KIT by Does not apply route.    . budesonide-formoterol (SYMBICORT) 80-4.5 MCG/ACT inhaler 1 puff 1-2 times per day with spacer and rinse mouth afterwards. 1 each 5  . cetirizine (ZYRTEC) 10 MG tablet Take 10 mg by mouth daily.    . Dapsone (ACZONE) 7.5 % GEL Apply to face daily 60 g 2  . desonide (DESOWEN) 0.05 % ointment Apply to the scalp once daily as needed for itching.    . diphenhydrAMINE (BENADRYL) 50 MG tablet Take 50 mg by mouth every 6 (six) hours as needed for itching.    . donepezil (ARICEPT) 5 MG tablet Take by mouth.    . ELDERBERRY PO Take 1 tablet by mouth daily.    . empagliflozin (JARDIANCE) 10 MG TABS tablet Take 1 tablet by mouth daily.    Elgie Collard SURECLICK 50 MG/ML injection Inject into the skin.    Marland Kitchen EPINEPHrine 0.3 mg/0.3 mL IJ SOAJ injection Inject 0.3 mg into the muscle as needed for anaphylaxis. 2 each 1  . famotidine (PEPCID) 20 MG tablet TAKE 1 TABLET BY MOUTH ONCE DAILY AS NEEDED FOR  HEARTBURN  OR  INDIGESTION 30 tablet 5  . finasteride (PROSCAR) 5 MG tablet     . fluocinonide ointment (LIDEX) 0.05 % APPLY OINTMENT TO SCALP 2 TO 3 TIMES PER WEEK **DO NOT APPLY TO FACE**    . fluticasone (FLONASE) 50 MCG/ACT nasal spray Place 1-2 sprays into both nostrils daily as needed (nasal congestion). 16 g 5  . folic acid (FOLVITE) 1 MG tablet Take 1 mg by mouth daily.    Marland Kitchen  HYDROcodone-acetaminophen (NORCO/VICODIN) 5-325 MG tablet Take 1 tablet by mouth every 6 (six) hours as needed.    . hydrOXYzine (ATARAX/VISTARIL) 25 MG tablet ONE TABLET AT BEDTIME AS NEEDED FOR ITCHING. 30 tablet 3  . ibuprofen (ADVIL,MOTRIN) 800 MG tablet Take 1 tablet (800 mg total) by mouth every 8 (eight) hours as needed for mild pain. 30 tablet 0  . levocetirizine (XYZAL) 5 MG tablet Take 1 tablet (5 mg total) by mouth daily as needed for allergies. 90 tablet 1  . lisinopril-hydrochlorothiazide  (PRINZIDE,ZESTORETIC) 10-12.5 MG tablet Take 0.5 tablets by mouth.     Marland Kitchen lisinopril-hydrochlorothiazide (ZESTORETIC) 10-12.5 MG tablet Take by mouth.    . meloxicam (MOBIC) 7.5 MG tablet Take by mouth.    . methocarbamol (ROBAXIN) 500 MG tablet Take 500 mg by mouth 3 (three) times daily.    . metroNIDAZOLE (METROGEL) 1 % gel Apply to face daily for Rosacea 45 g 3  . minoxidil (LONITEN) 2.5 MG tablet SMARTSIG:.5 Tablet(s) By Mouth Daily    . NON FORMULARY     . omeprazole (PRILOSEC) 20 MG capsule Take 1 capsule (20 mg total) by mouth daily. 90 capsule 0  . potassium chloride (KLOR-CON) 10 MEQ tablet Take 10 mEq by mouth 2 (two) times daily.    . pregabalin (LYRICA) 100 MG capsule Take by mouth.    . traMADol (ULTRAM) 50 MG tablet     . triamcinolone ointment (KENALOG) 0.1 % APPLY  OINTMENT TOPICALLY TO AFFECTED AREA TWICE DAILY 30 g 0  . ZINC SULFATE ER PO Take by mouth.     No current facility-administered medications for this visit.   Allergies: Allergies  Allergen Reactions  . Iodinated Contrast Media Anaphylaxis    Cardiac Arrest Seizure  . Other Hives  . Shellfish Allergy Hives  . Sulfasalazine Hives  . Tetrofosmin [Technetium-4m] Hives and Anaphylaxis  . Banana Hives  . Cat Dander Other (See Comments)    Hives   . Egg-Derived Products Nausea And Vomiting  . Haemophilus B Polysaccharide Vaccine Other (See Comments)    OTHER  OTHER    . Strawberry Extract Hives  . Sulfamethoxazole Rash  . Biaxin [Clarithromycin] Hives  . Latex Hives  . Peanuts [Peanut Oil] Hives  . Pravastatin Hives  . Shellfish-Derived Products Hives  . Sulfa Antibiotics Hives and Rash   I reviewed her past medical history, social history, family history, and environmental history and no significant changes have been reported from her previous visit.  Review of Systems  Constitutional:  Negative for appetite change, chills, fever and unexpected weight change.  HENT:  Negative for congestion,  postnasal drip, rhinorrhea and sneezing.   Eyes:  Negative for itching.  Respiratory:  Negative for cough, chest tightness, shortness of breath and wheezing.   Gastrointestinal:  Negative for abdominal pain.  Skin:  Negative for rash.  Allergic/Immunologic: Positive for environmental allergies and food allergies.  Neurological:  Negative for headaches.   Objective: There were no vitals taken for this visit. There is no height or weight on file to calculate BMI. Physical Exam Vitals and nursing note reviewed.  Constitutional:      Appearance: Normal appearance. She is well-developed.  HENT:     Head: Normocephalic and atraumatic.     Right Ear: Tympanic membrane and external ear normal.     Left Ear: Tympanic membrane and external ear normal.     Nose: Nose normal.     Mouth/Throat:     Mouth: Mucous membranes are moist.  Pharynx: Oropharynx is clear.  Eyes:     Conjunctiva/sclera: Conjunctivae normal.  Cardiovascular:     Rate and Rhythm: Normal rate and regular rhythm.     Heart sounds: Normal heart sounds. No murmur heard. Pulmonary:     Effort: Pulmonary effort is normal.     Breath sounds: Normal breath sounds. No wheezing, rhonchi or rales.  Musculoskeletal:     Cervical back: Neck supple.  Skin:    General: Skin is warm.     Findings: No rash.  Neurological:     Mental Status: She is alert and oriented to person, place, and time.  Psychiatric:        Behavior: Behavior normal.  Previous notes and tests were reviewed. The plan was reviewed with the patient/family, and all questions/concerned were addressed.  It was my pleasure to see Naryiah today and participate in her care. Please feel free to contact me with any questions or concerns.  Sincerely,  Wyline Mood, DO Allergy & Immunology  [facility] office: [phone] [facility] office: [phone]

## 2024-03-15 ENCOUNTER — Encounter: Payer: Self-pay | Admitting: Allergy

## 2024-03-15 ENCOUNTER — Ambulatory Visit (INDEPENDENT_AMBULATORY_CARE_PROVIDER_SITE_OTHER): Admitting: Allergy

## 2024-03-15 ENCOUNTER — Ambulatory Visit: Payer: 59 | Admitting: Allergy

## 2024-03-15 VITALS — BP 150/76 | HR 63 | Temp 97.7°F | Resp 16 | Wt 179.2 lb

## 2024-03-15 DIAGNOSIS — R030 Elevated blood-pressure reading, without diagnosis of hypertension: Secondary | ICD-10-CM

## 2024-03-15 DIAGNOSIS — K219 Gastro-esophageal reflux disease without esophagitis: Secondary | ICD-10-CM

## 2024-03-15 DIAGNOSIS — H1013 Acute atopic conjunctivitis, bilateral: Secondary | ICD-10-CM

## 2024-03-15 DIAGNOSIS — J3089 Other allergic rhinitis: Secondary | ICD-10-CM | POA: Diagnosis not present

## 2024-03-15 DIAGNOSIS — J301 Allergic rhinitis due to pollen: Secondary | ICD-10-CM

## 2024-03-15 DIAGNOSIS — J4540 Moderate persistent asthma, uncomplicated: Secondary | ICD-10-CM

## 2024-03-15 DIAGNOSIS — J309 Allergic rhinitis, unspecified: Secondary | ICD-10-CM

## 2024-03-15 DIAGNOSIS — T7800XD Anaphylactic reaction due to unspecified food, subsequent encounter: Secondary | ICD-10-CM

## 2024-03-15 MED ORDER — FAMOTIDINE 20 MG PO TABS: 20.0000 mg | ORAL_TABLET | Freq: Every day | ORAL | 2 refills | Status: DC | PRN

## 2024-03-15 MED ORDER — AZELASTINE HCL 0.1 % NA SOLN: 1.0000 | Freq: Two times a day (BID) | NASAL | 5 refills | Status: DC | PRN

## 2024-03-15 MED ORDER — FLUTICASONE PROPIONATE 50 MCG/ACT NA SUSP: 1.0000 | Freq: Every day | NASAL | 5 refills | Status: DC | PRN

## 2024-03-15 MED ORDER — OMEPRAZOLE 20 MG PO CPDR: 20.0000 mg | DELAYED_RELEASE_CAPSULE | Freq: Every day | ORAL | 2 refills | Status: DC

## 2024-03-15 MED ORDER — BUDESONIDE-FORMOTEROL FUMARATE 80-4.5 MCG/ACT IN AERO: INHALATION_SPRAY | RESPIRATORY_TRACT | 5 refills | Status: DC

## 2024-03-15 MED ORDER — NEBULIZER/TUBING/MOUTHPIECE KIT
PACK | 1 refills | Status: AC
Start: 2024-03-15 — End: ?

## 2024-03-15 MED ORDER — LEVOCETIRIZINE DIHYDROCHLORIDE 5 MG PO TABS: 5.0000 mg | ORAL_TABLET | Freq: Every day | ORAL | 2 refills | Status: DC | PRN

## 2024-03-15 MED ORDER — NEBULIZER/TUBING/MOUTHPIECE KIT: PACK | 1 refills | Status: DC

## 2024-03-15 NOTE — Patient Instructions (Addendum)
 Moderate persistent asthma Normal breathing test today. Daily controller medication(s): Symbicort 1-2 puffs 1-2 times per day with spacer and rinse mouth afterwards. During respiratory infections/asthma flares:  Start Symbicort 2 puffs twice a day with spacer and rinse mouth afterwards for 1-2 weeks until your breathing symptoms return to baseline.  Pretreat with albuterol 2 puffs or albuterol nebulizer.  If you need to use your albuterol nebulizer machine back to back within 15-30 minutes with no relief then please go to the ER/urgent care for further evaluation.  May use albuterol rescue inhaler 2 puffs or nebulizer every 4 to 6 hours as needed for shortness of breath, chest tightness, coughing, and wheezing. Monitor frequency of use - if you need to use it more than twice per week on a consistent basis let us know.  Asthma control goals:  Full participation in all desired activities (may need albuterol before activity) Albuterol use two times or less a week on average (not counting use with activity) Cough interfering with sleep two times or less a month Oral steroids no more than once a year No hospitalizations   Seasonal and perennial allergic rhinitis Continue environmental control measures. Continue allergy injections - given today.  Use Flonase (fluticasone) nasal spray 1-2 sprays per nostril once a day as needed for nasal congestion.  Nasal saline spray (i.e., Simply Saline) or nasal saline lavage (i.e., NeilMed) is recommended as needed and prior to medicated nasal sprays. Use azelastine nasal spray 1-2 sprays per nostril twice a day as needed for runny nose/drainage. ONLY USE the eye drops if needed.  You may use over the counter refresh eye drops additionally if needed.  May use over the counter antihistamines such as Zyrtec (cetirizine), Claritin (loratadine), Allegra (fexofenadine), or Xyzal (levocetirizine) daily as needed.  Food allergy Continue careful  avoidance of egg, peanuts, tree nuts, shellfish, bananas, and strawberries. Okay to eat flounder.  For mild symptoms you can take over the counter antihistamines such as Benadryl 1-2 tablets = 25-50mg  and monitor symptoms closely. If symptoms worsen or if you have severe symptoms including breathing issues, throat closure, significant swelling, whole body hives, severe diarrhea and vomiting, lightheadedness then inject epinephrine and seek immediate medical care afterwards. Emergency action plan in place.    GERD (gastroesophageal reflux disease) Continue appropriate reflux lifestyle modifications. Continue omeprazole 20mg  one tablet in the morning. Nothing to eat or drink for 30 minutes afterwards.  May take famotidine 20mg  daily as needed.   Elevated blood pressure  Blood pressure reading was high in our office today. Vitals:   03/15/24 1506  BP: (!) 150/76   Please follow up with PCP regarding this.    Follow up in 6 months or sooner if needed.

## 2024-03-22 ENCOUNTER — Ambulatory Visit (INDEPENDENT_AMBULATORY_CARE_PROVIDER_SITE_OTHER)

## 2024-03-22 DIAGNOSIS — J309 Allergic rhinitis, unspecified: Secondary | ICD-10-CM

## 2024-04-20 ENCOUNTER — Telehealth: Payer: Self-pay | Admitting: Family Medicine

## 2024-04-20 NOTE — Telephone Encounter (Signed)
 Pt request a call back about a rash she has, she has taken all meds that she has.

## 2024-04-21 NOTE — Telephone Encounter (Signed)
 Called patient - DOB/NEED Updated DPR verified - LMOVM advising patient to send pictures of the rash to [email or web address] so a provider can view rash.   Patient was also advised to contact the [facility] office if needed to schedule office visit for Monday, 04/25/24 [facility]) or tomorrow in [place] or possibly [place].  If/When patient call back - please advise of the above notation.

## 2024-04-26 ENCOUNTER — Ambulatory Visit (INDEPENDENT_AMBULATORY_CARE_PROVIDER_SITE_OTHER): Admitting: Family Medicine

## 2024-04-26 ENCOUNTER — Other Ambulatory Visit: Payer: Self-pay

## 2024-04-26 ENCOUNTER — Other Ambulatory Visit: Payer: Self-pay | Admitting: Allergy

## 2024-04-26 ENCOUNTER — Encounter: Payer: Self-pay | Admitting: Family Medicine

## 2024-04-26 VITALS — BP 128/78 | HR 62 | Temp 98.0°F | Resp 20 | Wt 178.0 lb

## 2024-04-26 DIAGNOSIS — H1013 Acute atopic conjunctivitis, bilateral: Secondary | ICD-10-CM | POA: Insufficient documentation

## 2024-04-26 DIAGNOSIS — J3089 Other allergic rhinitis: Secondary | ICD-10-CM

## 2024-04-26 DIAGNOSIS — L501 Idiopathic urticaria: Secondary | ICD-10-CM | POA: Insufficient documentation

## 2024-04-26 DIAGNOSIS — T7800XD Anaphylactic reaction due to unspecified food, subsequent encounter: Secondary | ICD-10-CM

## 2024-04-26 DIAGNOSIS — K219 Gastro-esophageal reflux disease without esophagitis: Secondary | ICD-10-CM | POA: Diagnosis not present

## 2024-04-26 DIAGNOSIS — J302 Other seasonal allergic rhinitis: Secondary | ICD-10-CM

## 2024-04-26 DIAGNOSIS — J4540 Moderate persistent asthma, uncomplicated: Secondary | ICD-10-CM | POA: Diagnosis not present

## 2024-04-26 MED ORDER — PREDNISONE 10 MG PO TABS: 10.0000 mg | ORAL_TABLET | Freq: Two times a day (BID) | ORAL | 0 refills | Status: AC

## 2024-04-26 MED ORDER — HYDROCORTISONE 2.5 % EX CREA: TOPICAL_CREAM | Freq: Two times a day (BID) | CUTANEOUS | 2 refills | Status: AC | PRN

## 2024-04-26 NOTE — Patient Instructions (Signed)
 Rash/Hives (urticaria) Take the least amount of medication while remaining hive free Fexofenadine (Allegra) 180 mg twice a day and famotidine  (Pepcid ) 20 mg twice a day. If no symptoms for 7-14 days then decrease to. Fexofenadine (Allegra) 180 mg twice a day and famotidine  (Pepcid ) 20 mg once a day.  If no symptoms for 7-14 days then decrease to. Fexofenadine (Allegra) 180 mg twice a day.  If no symptoms for 7-14 days then decrease to. Fexofenadine (Allegra) 180 mgonce a day.  May use Benadryl (diphenhydramine) as needed for breakthrough hives       If symptoms return, then step up dosage Begin prednisone 10 mg twice a day for the next 3 days, then stop.  Check your blood sugar more frequently while taking prednisone Begin hydrocortisone 2.5% cream to reddened itchy areas up to twice a day if needed. Keep a detailed symptom journal including foods eaten, contact with allergens, medications taken, weather changes.  Take pictures if possible   Asthma Continue Symbicort  80-2 puffs twice a day with a spacer to prevent cough or wheeze Continue albuterol  2 puffs once every 4 hours if needed for cough or wheeze You may use albuterol  2 puffs 5 to 15 minutes before activity to decrease cough or wheeze  Allergic rhinitis Continue allergen avoidance measures directed toward grass pollen, tree pollen, weed pollen, dust mite, and cat as listed below Continue allergen immunotherapy and have access to an epinephrine  autoinjector site Continue Flonase  2 sprays in each nostril once a day if needed for Stuffy nose continue azelastine  2 sprays in each nostril once or twice a day if needed for runny nose Consider saline nasal rinses as needed for nasal symptoms. Use this before any medicated nasal sprays for best result  Allergic conjunctivitis Some over the counter eye drops include Pataday  one drop in each eye once a day as needed for red, itchy eyes OR Zaditor one drop in each eye twice a day as needed for  red itchy eyes. Avoid eye drops that say red eye relief as they may contain medications that dry out your eyes.   Reflux Continue dietary and lifestyle modifications as listed below Continue omeprazole  20 mg daily to control reflux Continue famotidine  20 mg once a day if needed to control reflux  Food allergy Continue to avoid egg, peanut, tree nut, shellfish, strawberry, and banana.  In case of an allergic reaction, take Benadryl 50 mg every 4 hours, and if life-threatening symptoms occur, inject with EpiPen  0.3 mg.  Call the clinic if this treatment plan is not working well for you.  Follow up in 2 months or sooner if needed.  Reducing Pollen Exposure The American Academy of Allergy, Asthma and Immunology suggests the following steps to reduce your exposure to pollen during allergy seasons. Do not hang sheets or clothing out to dry; pollen may collect on these items. Do not mow lawns or spend time around freshly cut grass; mowing stirs up pollen. Keep windows closed at night.  Keep car windows closed while driving. Minimize morning activities outdoors, a time when pollen counts are usually at their highest. Stay indoors as much as possible when pollen counts or humidity is high and on windy days when pollen tends to remain in the air longer. Use air conditioning when possible.  Many air conditioners have filters that trap the pollen spores. Use a HEPA room air filter to remove pollen form the indoor air you breathe.   Control of Dust Mite Allergen Dust mites play  a major role in allergic asthma and rhinitis. They occur in environments with high humidity wherever human skin is found. Dust mites absorb humidity from the atmosphere (ie, they do not drink) and feed on organic matter (including shed human and animal skin). Dust mites are a microscopic type of insect that you cannot see with the naked eye. High levels of dust mites have been detected from mattresses, pillows, carpets,  upholstered furniture, bed covers, clothes, soft toys and any woven material. The principal allergen of the dust mite is found in its feces. A gram of dust may contain 1,000 mites and 250,000 fecal particles. Mite antigen is easily measured in the air during house cleaning activities. Dust mites do not bite and do not cause harm to humans, other than by triggering allergies/asthma.  Ways to decrease your exposure to dust mites in your home:  1. Encase mattresses, box springs and pillows with a mite-impermeable barrier or cover  2. Wash sheets, blankets and drapes weekly in hot water (130 F) with detergent and dry them in a dryer on the hot setting.  3. Have the room cleaned frequently with a vacuum cleaner and a damp dust-mop. For carpeting or rugs, vacuuming with a vacuum cleaner equipped with a high-efficiency particulate air (HEPA) filter. The dust mite allergic individual should not be in a room which is being cleaned and should wait 1 hour after cleaning before going into the room.  4. Do not sleep on upholstered furniture (eg, couches).  5. If possible removing carpeting, upholstered furniture and drapery from the home is ideal. Horizontal blinds should be eliminated in the rooms where the person spends the most time (bedroom, study, television room). Washable vinyl, roller-type shades are optimal.  6. Remove all non-washable stuffed toys from the bedroom. Wash stuffed toys weekly like sheets and blankets above.  7. Reduce indoor humidity to less than 50%. Inexpensive humidity monitors can be purchased at most hardware stores. Do not use a humidifier as can make the problem worse and are not recommended.  Control of Dog or Cat Allergen Avoidance is the best way to manage a dog or cat allergy. If you have a dog or cat and are allergic to dog or cats, consider removing the dog or cat from the home. If you have a dog or cat but don't want to find it a new home, or if your family wants a pet  even though someone in the household is allergic, here are some strategies that may help keep symptoms at bay:  Keep the pet out of your bedroom and restrict it to only a few rooms. Be advised that keeping the dog or cat in only one room will not limit the allergens to that room. Don't pet, hug or kiss the dog or cat; if you do, wash your hands with soap and water. High-efficiency particulate air (HEPA) cleaners run continuously in a bedroom or living room can reduce allergen levels over time. Regular use of a high-efficiency vacuum cleaner or a central vacuum can reduce allergen levels. Giving your dog or cat a bath at least once a week can reduce airborne allergen.

## 2024-04-26 NOTE — Progress Notes (Signed)
 [address] [place] [address] Dept: [phone]  FOLLOW UP NOTE  Patient ID: Gabrielle Shaw, female    DOB: May 22, 1961  Age: 63 y.o. MRN: [id number] Date of Office Visit: 04/26/2024  Assessment  Chief Complaint: Rash and Other (Same day rash on off for 3 weeks. She does swim possible chlorine. )  HPI Gabrielle Shaw is a 63 year old female who presents to the clinic for evaluation of rash.  She was last seen in this clinic on 03/16/2023 by Dr. 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    At today's visit, she reports that she has been experiencing a rash on her neck and bilateral antecubital fossa that began about 3 weeks ago.  She reports this rash is raised, red, raised itchy, and burning in nature.  She reports the rash comes and goes, however, never completely resolves.  She reports that she has not experienced this rash previously.  She denies concomitant cardiopulmonary or gastrointestinal symptoms with this rash.  She denies new foods, new medications, new personal care products, recent illness, or insect sting.  She does report that she swims in a chlorinated pool every day and is concerned about a chlorine allergy.  Toward the end of the interview she remembered that she ate a sandwich containing egg about 3 weeks ago after which she did experience some diarrhea.  The rash occurred shortly after ingestion of the egg.   Asthma is reported as well-controlled with occasional cough as the main symptom.  She denies shortness of breath or wheeze with activity or rest.  She continues Symbicort  80-2 puffs twice a day and reports that she is currently out of albuterol .  She reports infrequent episodes of using an extra puff of Symbicort  during the day.   Allergic rhinitis is reported as moderately well-controlled with occasional nasal symptoms for which she continues  occasional levocetirizine, however, she reports this makes her very sleepy.  She is not currently using nasal saline rinses, Flonase , or azelastine .  She continues allergen immunotherapy directed toward dust mite, weed pollen, cat, tree pollen, and grass pollen.   She continues to avoid peanuts, tree nuts, shellfish, strawberry, and banana.  She does report that about 3 weeks ago she had an egg sandwich followed by rash on her neck and diarrhea.  Her last food allergy testing via lab was on 08/08/2021 was negative to fish, egg, peanut, tree nut, strawberry, and banana and borderline positive to shellfish.  She is not currently interested in any food challenges.  EpiPen  set is up-to-date.  Her current medications are listed in the chart.  Drug Allergies:  Allergies  Allergen Reactions   Iodinated Contrast Media Anaphylaxis    Cardiac Arrest Seizure   Other Hives   Shellfish Allergy Hives   Sulfasalazine Hives   Tetrofosmin [Technetium-65m] Hives and Anaphylaxis   Banana Hives   Cat Dander Other (See Comments)    Hives    Egg-Derived Products Nausea And Vomiting   Haemophilus B Polysaccharide Vaccine Other (See Comments)    OTHER  OTHER     Strawberry Extract Hives   Sulfamethoxazole Rash   Biaxin [Clarithromycin] Hives   Latex Hives   Peanuts [Peanut Oil] Hives   Pravastatin Hives   Shellfish-Derived Products Hives   Sulfa Antibiotics Hives and Rash    Physical Exam: BP 128/78   Pulse 62   Temp 98 F (36.7  C) (Temporal)   Resp 20   Wt 178 lb (80.7 kg)   SpO2 98%   BMI 33.63 kg/m    Physical Exam Vitals reviewed.  Constitutional:      Appearance: Normal appearance.  HENT:     Head: Normocephalic and atraumatic.     Right Ear: Tympanic membrane normal.     Left Ear: Tympanic membrane normal.     Nose:     Comments: Bilateral nares slightly erythematous with thin clear nasal drainage noted.  Pharynx normal.  Ears normal.  Eyes normal.    Mouth/Throat:     Pharynx:  Oropharynx is clear.  Eyes:     Conjunctiva/sclera: Conjunctivae normal.  Cardiovascular:     Rate and Rhythm: Normal rate and regular rhythm.     Heart sounds: Normal heart sounds. No murmur heard. Pulmonary:     Effort: Pulmonary effort is normal.     Breath sounds: Normal breath sounds.     Comments: Lungs clear to auscultation Musculoskeletal:        General: Normal range of motion.     Cervical back: Normal range of motion and neck supple.  Skin:    Comments: Scattered, red, raised areas noted on her neck and bilateral antecubital fossa.  No open areas or drainage noted.  Neurological:     Mental Status: She is alert and oriented to person, place, and time.  Psychiatric:        Mood and Affect: Mood normal.        Behavior: Behavior normal.        Thought Content: Thought content normal.        Judgment: Judgment normal.     Assessment and Plan: 1. Moderate persistent asthma without complication   2. Seasonal and perennial allergic rhinoconjunctivitis   3. Allergic conjunctivitis of both eyes   4. Gastroesophageal reflux disease, unspecified whether esophagitis present   5. Anaphylactic reaction due to food, subsequent encounter   6. Idiopathic urticaria     Meds ordered this encounter  Medications   predniSONE (DELTASONE) 10 MG tablet    Sig: Take 1 tablet (10 mg total) by mouth in the morning and at bedtime for 3 days.    Dispense:  6 tablet    Refill:  0   hydrocortisone 2.5 % cream    Sig: Apply topically 2 (two) times daily as needed.    Dispense:  28 g    Refill:  2    Patient Instructions  Rash/Hives (urticaria) Take the least amount of medication while remaining hive free Fexofenadine (Allegra) 180 mg twice a day and famotidine  (Pepcid ) 20 mg twice a day. If no symptoms for 7-14 days then decrease to. Fexofenadine (Allegra) 180 mg twice a day and famotidine  (Pepcid ) 20 mg once a day.  If no symptoms for 7-14 days then decrease to. Fexofenadine (Allegra)  180 mg twice a day.  If no symptoms for 7-14 days then decrease to. Fexofenadine (Allegra) 180 mgonce a day.  May use Benadryl (diphenhydramine) as needed for breakthrough hives       If symptoms return, then step up dosage Begin prednisone 10 mg twice a day for the next 3 days, then stop.  Check your blood sugar more frequently while taking prednisone Begin hydrocortisone 2.5% cream to reddened itchy areas up to twice a day if needed. Keep a detailed symptom journal including foods eaten, contact with allergens, medications taken, weather changes.  Take pictures if possible   Asthma Continue  Symbicort  80-2 puffs twice a day with a spacer to prevent cough or wheeze Continue albuterol  2 puffs once every 4 hours if needed for cough or wheeze You may use albuterol  2 puffs 5 to 15 minutes before activity to decrease cough or wheeze  Allergic rhinitis Continue allergen avoidance measures directed toward grass pollen, tree pollen, weed pollen, dust mite, and cat as listed below Continue allergen immunotherapy and have access to an epinephrine  autoinjector site Continue Flonase  2 sprays in each nostril once a day if needed for Stuffy nose continue azelastine  2 sprays in each nostril once or twice a day if needed for runny nose Consider saline nasal rinses as needed for nasal symptoms. Use this before any medicated nasal sprays for best result  Allergic conjunctivitis Some over the counter eye drops include Pataday  one drop in each eye once a day as needed for red, itchy eyes OR Zaditor one drop in each eye twice a day as needed for red itchy eyes. Avoid eye drops that say red eye relief as they may contain medications that dry out your eyes.   Reflux Continue dietary and lifestyle modifications as listed below Continue omeprazole  20 mg daily to control reflux Continue famotidine  20 mg once a day if needed to control reflux  Food allergy Continue to avoid egg, peanut, tree nut, shellfish,  strawberry, and banana.  In case of an allergic reaction, take Benadryl 50 mg every 4 hours, and if life-threatening symptoms occur, inject with EpiPen  0.3 mg.  Call the clinic if this treatment plan is not working well for you.  Follow up in 2 months or sooner if needed.   Return in about 2 months (around 06/26/2024), or if symptoms worsen or fail to improve.    Thank you for the opportunity to care for this patient.  Please do not hesitate to contact me with questions.  Marinus Sic, FNP [facility] 

## 2024-04-28 ENCOUNTER — Telehealth: Payer: Self-pay | Admitting: Allergy

## 2024-04-28 NOTE — Telephone Encounter (Signed)
 Patient called and stated that she only has one dose of prednisone left and she is still itching and still has some swelling. Patient is requesting another prescription of prednisone be sent to her pharmacy. Patients pharmacy is [facility] in [place] on [address] Patients call back number is [phone]

## 2024-04-29 ENCOUNTER — Other Ambulatory Visit: Payer: Self-pay

## 2024-04-29 MED ORDER — PREDNISONE 10 MG PO TABS: ORAL_TABLET | ORAL | 0 refills | Status: AC

## 2024-04-29 NOTE — Telephone Encounter (Signed)
 Can you please call in the following: Prednisone  10 mg tablets. Take 2 tablets once a day for 4 days, then take 1 tablet on the 5th day, then stop.  Please have her make an appointment for further eval if not resolved by Monday. Thank you

## 2024-04-29 NOTE — Telephone Encounter (Signed)
 Spoke with the patient--DOB verified--informed her that prednisone  has been sent to the [facility] pharmacy in [place]. Verbalized understanding.

## 2024-05-03 ENCOUNTER — Ambulatory Visit: Admitting: Allergy

## 2024-05-03 ENCOUNTER — Ambulatory Visit (INDEPENDENT_AMBULATORY_CARE_PROVIDER_SITE_OTHER): Admitting: Allergy

## 2024-05-03 VITALS — BP 132/80 | HR 64 | Temp 98.3°F | Resp 18 | Ht 61.0 in | Wt 178.0 lb

## 2024-05-03 DIAGNOSIS — R21 Rash and other nonspecific skin eruption: Secondary | ICD-10-CM | POA: Diagnosis not present

## 2024-05-03 DIAGNOSIS — J3089 Other allergic rhinitis: Secondary | ICD-10-CM

## 2024-05-03 DIAGNOSIS — J4540 Moderate persistent asthma, uncomplicated: Secondary | ICD-10-CM | POA: Diagnosis not present

## 2024-05-03 DIAGNOSIS — K219 Gastro-esophageal reflux disease without esophagitis: Secondary | ICD-10-CM

## 2024-05-03 DIAGNOSIS — T7800XD Anaphylactic reaction due to unspecified food, subsequent encounter: Secondary | ICD-10-CM

## 2024-05-03 DIAGNOSIS — J302 Other seasonal allergic rhinitis: Secondary | ICD-10-CM

## 2024-05-03 DIAGNOSIS — H1010 Acute atopic conjunctivitis, unspecified eye: Secondary | ICD-10-CM

## 2024-05-03 NOTE — Progress Notes (Unsigned)
 Follow Up Note  RE: Gabrielle Shaw MRN: [id number] DOB: 01-Jan-1961 Date of Office Visit: 05/03/2024  Referring provider: Nash Bade, MD Primary care provider: Nash Bade, MD  Chief Complaint: Urticaria (Not sure the cause)  History of Present Illness: I had the pleasure of seeing Gabrielle Shaw for a follow up visit at the [facility] on 05/05/2024. She is a 63 y.o. female, who is being followed for allergic rhinoconjunctivitis on AIT, asthma, food allergy, GERD. Her previous allergy office visit was on 04/26/2024 with Marinus Sic, FNP. Today is a new complaint visit of rash.  Rash started about 1.5 months ago. Mainly occurs on her neck, arm. Describes them as itchy, red, flat. Individual rashes lasts about a few hours. No ecchymosis upon resolution. Associated symptoms include: none.  Frequency of episodes: depends. Suspected triggers are unknown - concerned about chlorine. Denies any fevers, chills, changes in medications, foods, personal care products or recent infections. She has tried the following therapies: hydrocortisone  cream with some benefit. Systemic steroids: yes. Currently on xyzal  5mg  [facility] prn.  Previous history of rash/hives: not recently. Patient is up to date with the following cancer screening tests: physical exam, mammo, colonoscopy.  Assessment and Plan: Gabrielle Shaw is a 63 y.o. female with: Rash Mainly on air exposed areas started 1.5 months ago. Denies any changes in diet, meds, personal care products. Patient has been leaving her windows open in her home and most likely having rash due to pollen contact irritation. Unlikely to be due to chlorine as it's happening only on certain areas of her body.  See below for environmental control measures. See below for proper skin care. Start taking allegra 180mg  once a day and may take up to twice a day if needed. Do NOT use alcohol to wipe off your skin. If you are not better after 1 month let me know and  will order some bloodwork then. Finish prednisone  taper.   Seasonal allergic rhinitis due to pollen Allergic rhinitis due to dust mite Allergic conjunctivitis of both eyes Past history - on AIT (cat-tree-grass + mite-weed). Tried to stop before but restarted due to worsening symptoms.  Continue environmental control measures. Continue allergy injections. Use Flonase  (fluticasone ) nasal spray 1-2 sprays per nostril once a day as needed for nasal congestion.  Nasal saline spray (i.e., Simply Saline) or nasal saline lavage (i.e., NeilMed) is recommended as needed and prior to medicated nasal sprays. Use azelastine  nasal spray 1-2 sprays per nostril twice a day as needed for runny nose/drainage. ONLY USE the eye drops if needed.  You may use over the counter refresh eye drops additionally if needed.  May use over the counter antihistamines such as Zyrtec (cetirizine), Claritin (loratadine), Allegra (fexofenadine), or Xyzal  (levocetirizine) daily as needed.   Moderate persistent asthma without complication Daily controller medication(s): take Symbicort  80mcg 2 puffs 2 times per day with spacer and rinse mouth afterwards for 2 weeks then to down to 1 puff twice a day. During respiratory infections/asthma flares:  Start Symbicort  80mcg 2 puffs twice a day with spacer and rinse mouth afterwards for 1-2 weeks until your breathing symptoms return to baseline.  Pretreat with albuterol  2 puffs or albuterol  nebulizer.  If you need to use your albuterol  nebulizer machine back to back within 15-30 minutes with no relief then please go to the ER/urgent care for further evaluation.  May use albuterol  rescue inhaler 2 puffs or nebulizer every 4 to 6 hours as needed for shortness of  breath, chest tightness, coughing, and wheezing. Monitor frequency of use - if you need to use it more than twice per week on a consistent basis let us  know.    Anaphylactic reaction due to food, subsequent encounter Past history  - 2018 blood work was negative to egg. Peanuts caused anaphylaxis requiring Epipen . Avoiding tree nuts. Failed egg challenge? Reaction to shrimp in the past. 2022 Blood work was negative to egg, fish, peanuts, tree nuts, strawberry, banana. Borderline positive to shellfish. Tolerates finned fish. Not interested in food challenges.  Continue careful avoidance of egg, peanuts, tree nuts, shellfish, bananas, and strawberries. Okay to eat flounder.  For mild symptoms you can take over the counter antihistamines such as Benadryl 1-2 tablets = 25-50mg  and monitor symptoms closely. If symptoms worsen or if you have severe symptoms including breathing issues, throat closure, significant swelling, whole body hives, severe diarrhea and vomiting, lightheadedness then inject epinephrine  and seek immediate medical care afterwards. Emergency action plan in place.    Gastroesophageal reflux disease, unspecified whether esophagitis present Continue appropriate reflux lifestyle modifications. Continue omeprazole  20mg  one tablet in the morning. Nothing to eat or drink for 30 minutes afterwards.  May take famotidine  20mg  daily as needed.   Return in about 3 months (around 08/03/2024).  No orders of the defined types were placed in this encounter.  Lab Orders  No laboratory test(s) ordered today    Diagnostics: None.   Medication List:  Current Outpatient Medications  Medication Sig Dispense Refill   albuterol  (PROVENTIL ) (2.5 MG/3ML) 0.083% nebulizer solution Take 3 mLs (2.5 mg total) by nebulization every 4 (four) hours as needed for wheezing or shortness of breath (coughing fits). 75 mL 1   albuterol  (VENTOLIN  HFA) 108 (90 Base) MCG/ACT inhaler INHALE 2 PUFFS BY MOUTH EVERY 4 HOURS AS NEEDED FOR WHEEZING FOR SHORTNESS OF BREATH 18 g 1   aspirin EC 81 MG tablet Take 81 mg by mouth as needed.      azelastine  (ASTELIN ) 0.1 % nasal spray Place 1-2 sprays into both nostrils 2 (two) times daily as needed (nasal  drainage). Use in each nostril as directed 30 mL 5   Blood Glucose Monitoring Suppl (ONETOUCH VERIO) w/Device KIT by Does not apply route.     budesonide -formoterol  (SYMBICORT ) 80-4.5 MCG/ACT inhaler 1-2 puffs 1-2 times per day with spacer and rinse mouth afterwards. 1 each 5   cetirizine (ZYRTEC) 10 MG tablet Take 10 mg by mouth daily.     Dapsone  (ACZONE ) 7.5 % GEL Apply to face daily 60 g 2   desonide (DESOWEN) 0.05 % ointment Apply to the scalp once daily as needed for itching.     diphenhydrAMINE (BENADRYL) 50 MG tablet Take 50 mg by mouth every 6 (six) hours as needed for itching.     donepezil (ARICEPT) 5 MG tablet Take by mouth.     ELDERBERRY PO Take 1 tablet by mouth daily.     empagliflozin (JARDIANCE) 10 MG TABS tablet Take 1 tablet by mouth daily.     ENBREL SURECLICK 50 MG/ML injection Inject into the skin.     EPINEPHrine  0.3 mg/0.3 mL IJ SOAJ injection Inject 0.3 mg into the muscle as needed for anaphylaxis. 2 each 1   famotidine  (PEPCID ) 20 MG tablet Take 1 tablet (20 mg total) by mouth daily as needed for heartburn or indigestion. 90 tablet 2   finasteride (PROSCAR) 5 MG tablet      fluocinonide ointment (LIDEX) 0.05 % APPLY OINTMENT TO SCALP 2  TO 3 TIMES PER WEEK **DO NOT APPLY TO FACE**     fluticasone  (FLONASE ) 50 MCG/ACT nasal spray Place 1-2 sprays into both nostrils daily as needed (nasal congestion). 16 g 5   folic acid (FOLVITE) 1 MG tablet Take 1 mg by mouth daily.     HYDROcodone -acetaminophen  (NORCO/VICODIN) 5-325 MG tablet Take 1 tablet by mouth every 6 (six) hours as needed.     hydrocortisone  2.5 % cream Apply topically 2 (two) times daily as needed. 28 g 2   hydrOXYzine  (ATARAX /VISTARIL ) 25 MG tablet ONE TABLET AT BEDTIME AS NEEDED FOR ITCHING. 30 tablet 3   ibuprofen  (ADVIL ,MOTRIN ) 800 MG tablet Take 1 tablet (800 mg total) by mouth every 8 (eight) hours as needed for mild pain. 30 tablet 0   levocetirizine (XYZAL ) 5 MG tablet Take 1 tablet (5 mg total) by mouth  daily as needed for allergies. 90 tablet 2   lisinopril-hydrochlorothiazide (PRINZIDE,ZESTORETIC) 10-12.5 MG tablet Take 0.5 tablets by mouth.      lisinopril-hydrochlorothiazide (ZESTORETIC) 10-12.5 MG tablet Take by mouth.     meloxicam (MOBIC) 7.5 MG tablet Take by mouth.     methocarbamol  (ROBAXIN ) 500 MG tablet Take 500 mg by mouth 3 (three) times daily.     metroNIDAZOLE  (METROGEL ) 1 % gel Apply to face daily for Rosacea 45 g 3   minoxidil (LONITEN) 2.5 MG tablet SMARTSIG:.5 Tablet(s) By Mouth Daily     NON FORMULARY      omeprazole  (PRILOSEC) 20 MG capsule Take 1 capsule (20 mg total) by mouth daily. 90 capsule 2   potassium chloride (KLOR-CON) 10 MEQ tablet Take 10 mEq by mouth 2 (two) times daily.     predniSONE  (DELTASONE ) 10 MG tablet Take 2 tablets once a day for 4 days, then take 1 tablet on the 5th day, then stop. 9 tablet 0   pregabalin (LYRICA) 100 MG capsule Take by mouth.     Respiratory Therapy Supplies (NEBULIZER/TUBING/MOUTHPIECE) KIT Use as directed 1 kit 1   traMADol (ULTRAM) 50 MG tablet      triamcinolone  ointment (KENALOG ) 0.1 % APPLY  OINTMENT TOPICALLY TO AFFECTED AREA TWICE DAILY 30 g 0   ZINC SULFATE ER PO Take by mouth.     No current facility-administered medications for this visit.   Allergies: Allergies  Allergen Reactions   Iodinated Contrast Media Anaphylaxis    Cardiac Arrest Seizure   Other Hives   Shellfish Allergy Hives   Sulfasalazine Hives   Tetrofosmin [Technetium-71m] Hives and Anaphylaxis   Banana Hives   Cat Dander Other (See Comments)    Hives    Egg-Derived Products Nausea And Vomiting   Haemophilus B Polysaccharide Vaccine Other (See Comments)    OTHER  OTHER     Strawberry Extract Hives   Sulfamethoxazole Rash   Biaxin [Clarithromycin] Hives   Latex Hives   Peanuts [Peanut Oil] Hives   Pravastatin Hives   Shellfish-Derived Products Hives   Sulfa Antibiotics Hives and Rash   I reviewed her past medical history, social  history, family history, and environmental history and no significant changes have been reported from her previous visit.  Review of Systems  Constitutional:  Negative for appetite change, chills, fever and unexpected weight change.  HENT:  Negative for postnasal drip and sneezing.   Eyes:  Negative for itching.  Respiratory:  Negative for cough, chest tightness, shortness of breath and wheezing.   Gastrointestinal:  Negative for abdominal pain.  Skin:  Positive for rash.  Allergic/Immunologic: Positive  for environmental allergies and food allergies.  Neurological:  Negative for headaches.    Objective: BP 132/80 (BP Location: Right Arm, Patient Position: Sitting, Cuff Size: Normal)   Pulse 64   Temp 98.3 F (36.8 C) (Temporal)   Resp 18   Ht 5\' 1"  (1.549 m)   Wt 178 lb (80.7 kg)   SpO2 96%   BMI 33.63 kg/m  Body mass index is 33.63 kg/m. Physical Exam Vitals and nursing note reviewed.  Constitutional:      Appearance: Normal appearance. She is well-developed.  HENT:     Head: Normocephalic and atraumatic.     Right Ear: Tympanic membrane and external ear normal.     Left Ear: Tympanic membrane and external ear normal.     Nose: Nose normal.     Mouth/Throat:     Mouth: Mucous membranes are moist.     Pharynx: Oropharynx is clear.  Eyes:     Conjunctiva/sclera: Conjunctivae normal.  Cardiovascular:     Rate and Rhythm: Normal rate and regular rhythm.     Heart sounds: Normal heart sounds. No murmur heard. Pulmonary:     Effort: Pulmonary effort is normal.     Breath sounds: Normal breath sounds. No wheezing, rhonchi or rales.  Musculoskeletal:     Cervical back: Neck supple.  Skin:    General: Skin is warm.     Findings: No rash.  Neurological:     Mental Status: She is alert and oriented to person, place, and time.  Psychiatric:        Behavior: Behavior normal.    Previous notes and tests were reviewed. The plan was reviewed with the patient/family, and all  questions/concerned were addressed.  It was my pleasure to see Gabrielle Shaw today and participate in her care. Please feel free to contact me with any questions or concerns.  Sincerely,  Eudelia Hero, DO Allergy & Immunology  [facility]  [facility] office: [phone] [facility] office: [phone]

## 2024-05-03 NOTE — Patient Instructions (Addendum)
 Rash I think it's due to the pollen exposure. See below for environmental control measures. See below for proper skin care. Start taking allegra 180mg  once a day and may take up to twice a day if needed. Do NOT use alcohol to wipe off your skin. If you are not better after 1 month let me know and will order some bloodwork then. Finish prednisone  taper.   Moderate persistent asthma Daily controller medication(s): take Symbicort  80mcg 2 puffs 2 times per day with spacer and rinse mouth afterwards for 2 weeks then to down to 1 puff twice a day.  During respiratory infections/asthma flares:  Start Symbicort  80mcg 2 puffs twice a day with spacer and rinse mouth afterwards for 1-2 weeks until your breathing symptoms return to baseline.  Pretreat with albuterol  2 puffs or albuterol  nebulizer.  If you need to use your albuterol  nebulizer machine back to back within 15-30 minutes with no relief then please go to the ER/urgent care for further evaluation.  May use albuterol  rescue inhaler 2 puffs or nebulizer every 4 to 6 hours as needed for shortness of breath, chest tightness, coughing, and wheezing. Monitor frequency of use - if you need to use it more than twice per week on a consistent basis let us  know.  Asthma control goals:  Full participation in all desired activities (may need albuterol  before activity) Albuterol  use two times or less a week on average (not counting use with activity) Cough interfering with sleep two times or less a month Oral steroids no more than once a year No hospitalizations   Seasonal and perennial allergic rhinitis Continue environmental control measures. Continue allergy injections. Use Flonase  (fluticasone ) nasal spray 1-2 sprays per nostril once a day as needed for nasal congestion.  Nasal saline spray (i.e., Simply Saline) or nasal saline lavage (i.e., NeilMed) is recommended as needed and prior to medicated nasal sprays. Use azelastine  nasal spray 1-2 sprays  per nostril twice a day as needed for runny nose/drainage. ONLY USE the eye drops if needed.  You may use over the counter refresh eye drops additionally if needed.  May use over the counter antihistamines such as Zyrtec (cetirizine), Claritin (loratadine), Allegra (fexofenadine), or Xyzal  (levocetirizine) daily as needed.  Food allergy Continue careful avoidance of egg, peanuts, tree nuts, shellfish, bananas, and strawberries. Okay to eat flounder.  For mild symptoms you can take over the counter antihistamines such as Benadryl 1-2 tablets = 25-50mg  and monitor symptoms closely. If symptoms worsen or if you have severe symptoms including breathing issues, throat closure, significant swelling, whole body hives, severe diarrhea and vomiting, lightheadedness then inject epinephrine  and seek immediate medical care afterwards. Emergency action plan in place.    GERD (gastroesophageal reflux disease) Continue appropriate reflux lifestyle modifications. Continue omeprazole  20mg  one tablet in the morning. Nothing to eat or drink for 30 minutes afterwards.  May take famotidine  20mg  daily as needed.   Follow up in 3 months or sooner if needed.   Reducing Pollen Exposure Pollen seasons: trees (spring), grass (summer) and ragweed/weeds (fall). Keep windows closed in your home and car to lower pollen exposure.  Install air conditioning in the bedroom and throughout the house if possible.  Avoid going out in dry windy days - especially early morning. Pollen counts are highest between 5 - 10 AM and on dry, hot and windy days.  Save outside activities for late afternoon or after a heavy rain, when pollen levels are lower.  Avoid mowing of grass if you  have grass pollen allergy. Be aware that pollen can also be transported indoors on people and pets.  Dry your clothes in an automatic dryer rather than hanging them outside where they might collect pollen.  Rinse hair and eyes before bedtime.

## 2024-05-05 ENCOUNTER — Encounter: Payer: Self-pay | Admitting: Allergy

## 2024-05-17 ENCOUNTER — Ambulatory Visit: Payer: 59 | Admitting: Allergy

## 2024-05-26 ENCOUNTER — Ambulatory Visit (INDEPENDENT_AMBULATORY_CARE_PROVIDER_SITE_OTHER)

## 2024-05-26 DIAGNOSIS — J309 Allergic rhinitis, unspecified: Secondary | ICD-10-CM

## 2024-06-27 ENCOUNTER — Other Ambulatory Visit: Payer: Self-pay | Admitting: Dermatology

## 2024-06-30 ENCOUNTER — Ambulatory Visit (INDEPENDENT_AMBULATORY_CARE_PROVIDER_SITE_OTHER)

## 2024-06-30 DIAGNOSIS — J309 Allergic rhinitis, unspecified: Secondary | ICD-10-CM

## 2024-07-06 ENCOUNTER — Other Ambulatory Visit: Payer: Self-pay | Admitting: Allergy

## 2024-07-28 ENCOUNTER — Ambulatory Visit (INDEPENDENT_AMBULATORY_CARE_PROVIDER_SITE_OTHER)

## 2024-07-28 DIAGNOSIS — J309 Allergic rhinitis, unspecified: Secondary | ICD-10-CM | POA: Diagnosis not present

## 2024-08-01 DIAGNOSIS — J3081 Allergic rhinitis due to animal (cat) (dog) hair and dander: Secondary | ICD-10-CM | POA: Diagnosis not present

## 2024-08-01 NOTE — Progress Notes (Signed)
 VIALS MADE ON 08/01/24

## 2024-08-02 DIAGNOSIS — J3089 Other allergic rhinitis: Secondary | ICD-10-CM | POA: Diagnosis not present

## 2024-08-25 ENCOUNTER — Ambulatory Visit (INDEPENDENT_AMBULATORY_CARE_PROVIDER_SITE_OTHER): Admitting: *Deleted

## 2024-08-25 DIAGNOSIS — J309 Allergic rhinitis, unspecified: Secondary | ICD-10-CM

## 2024-09-15 ENCOUNTER — Ambulatory Visit: Admitting: Allergy

## 2024-10-04 ENCOUNTER — Ambulatory Visit (INDEPENDENT_AMBULATORY_CARE_PROVIDER_SITE_OTHER)

## 2024-10-04 DIAGNOSIS — J309 Allergic rhinitis, unspecified: Secondary | ICD-10-CM | POA: Diagnosis not present

## 2024-10-25 ENCOUNTER — Ambulatory Visit

## 2024-10-25 DIAGNOSIS — J309 Allergic rhinitis, unspecified: Secondary | ICD-10-CM | POA: Diagnosis not present

## 2024-10-25 DIAGNOSIS — J3089 Other allergic rhinitis: Secondary | ICD-10-CM

## 2024-11-01 ENCOUNTER — Telehealth: Payer: Self-pay

## 2024-11-01 ENCOUNTER — Other Ambulatory Visit: Payer: Self-pay

## 2024-11-01 ENCOUNTER — Ambulatory Visit (INDEPENDENT_AMBULATORY_CARE_PROVIDER_SITE_OTHER)

## 2024-11-01 DIAGNOSIS — J309 Allergic rhinitis, unspecified: Secondary | ICD-10-CM | POA: Diagnosis not present

## 2024-11-01 DIAGNOSIS — J3089 Other allergic rhinitis: Secondary | ICD-10-CM

## 2024-11-01 MED ORDER — BUDESONIDE-FORMOTEROL FUMARATE 80-4.5 MCG/ACT IN AERO: INHALATION_SPRAY | RESPIRATORY_TRACT | 0 refills | Status: DC

## 2024-11-01 NOTE — Telephone Encounter (Signed)
Left message for patient to call and schedule follow up visit

## 2024-11-01 NOTE — Telephone Encounter (Signed)
 Received refill request from [facility] on [address] in [facility] for patients symbicrt with 5 refills. Sen in courtesy needs visit. Per Dr Mariella last note patient was advised to follow up in 3 months sometime around 07/2024 to check asthma. She needs visit for further refills.

## 2024-11-08 ENCOUNTER — Other Ambulatory Visit: Payer: Self-pay

## 2024-11-08 ENCOUNTER — Encounter: Payer: Self-pay | Admitting: Allergy

## 2024-11-08 ENCOUNTER — Ambulatory Visit (INDEPENDENT_AMBULATORY_CARE_PROVIDER_SITE_OTHER): Admitting: Allergy

## 2024-11-08 VITALS — BP 124/74 | HR 80 | Temp 98.4°F | Resp 20 | Ht 61.0 in | Wt 171.4 lb

## 2024-11-08 DIAGNOSIS — J301 Allergic rhinitis due to pollen: Secondary | ICD-10-CM

## 2024-11-08 DIAGNOSIS — J3089 Other allergic rhinitis: Secondary | ICD-10-CM

## 2024-11-08 DIAGNOSIS — J4540 Moderate persistent asthma, uncomplicated: Secondary | ICD-10-CM

## 2024-11-08 DIAGNOSIS — H1013 Acute atopic conjunctivitis, bilateral: Secondary | ICD-10-CM | POA: Diagnosis not present

## 2024-11-08 DIAGNOSIS — K219 Gastro-esophageal reflux disease without esophagitis: Secondary | ICD-10-CM

## 2024-11-08 DIAGNOSIS — T7800XD Anaphylactic reaction due to unspecified food, subsequent encounter: Secondary | ICD-10-CM

## 2024-11-08 MED ORDER — EPINEPHRINE 0.3 MG/0.3ML IJ SOAJ: 0.3000 mg | INTRAMUSCULAR | 1 refills | Status: AC | PRN

## 2024-11-08 MED ORDER — ALBUTEROL SULFATE HFA 108 (90 BASE) MCG/ACT IN AERS: 2.0000 | INHALATION_SPRAY | RESPIRATORY_TRACT | 1 refills | Status: AC | PRN

## 2024-11-08 MED ORDER — OMEPRAZOLE 20 MG PO CPDR: 20.0000 mg | DELAYED_RELEASE_CAPSULE | Freq: Every day | ORAL | 2 refills | Status: AC

## 2024-11-08 MED ORDER — CETIRIZINE HCL 10 MG PO TABS: 10.0000 mg | ORAL_TABLET | Freq: Every day | ORAL | 2 refills | Status: AC

## 2024-11-08 MED ORDER — AZELASTINE HCL 0.1 % NA SOLN: 1.0000 | Freq: Two times a day (BID) | NASAL | 5 refills | Status: AC | PRN

## 2024-11-08 MED ORDER — FLUTICASONE PROPIONATE 50 MCG/ACT NA SUSP: 1.0000 | Freq: Every day | NASAL | 5 refills | Status: AC | PRN

## 2024-11-08 MED ORDER — BUDESONIDE-FORMOTEROL FUMARATE 80-4.5 MCG/ACT IN AERO: INHALATION_SPRAY | RESPIRATORY_TRACT | 5 refills | Status: AC

## 2024-11-08 NOTE — Patient Instructions (Addendum)
 Moderate persistent asthma Normal breathing test today.  Daily controller medication(s): continue Symbicort  80mcg 1-2 puffs 2 times per day with spacer and rinse mouth after each use.  May use albuterol  rescue inhaler 2 puffs every 4 to 6 hours as needed for shortness of breath, chest tightness, coughing, and wheezing. Monitor frequency of use - if you need to use it more than twice per week on a consistent basis let us  know.  Asthma control goals:  Full participation in all desired activities (may need albuterol  before activity) Albuterol  use two times or less a week on average (not counting use with activity) Cough interfering with sleep two times or less a month Oral steroids no more than once a year No hospitalizations   Seasonal and perennial allergic rhinitis Continue environmental control measures. Continue allergy injections - given today.  Use Flonase  (fluticasone ) nasal spray 1-2 sprays per nostril once a day as needed for nasal congestion.  Use azelastine  nasal spray 1-2 sprays per nostril twice a day as needed for runny nose/drainage. Nasal saline spray (i.e., Simply Saline) or nasal saline lavage (i.e., NeilMed) is recommended as needed and prior to medicated nasal sprays. May use over the counter antihistamines such as Zyrtec (cetirizine), Claritin (loratadine), Allegra (fexofenadine), or Xyzal  (levocetirizine) daily as needed. Reach out to your eye doctor about the eye drops.  Food allergy Continue careful avoidance of egg, peanuts, tree nuts, shellfish, bananas, and strawberries. Okay to eat flounder.  For mild symptoms you can take over the counter antihistamines (zyrtec 10mg  to 20mg ) and monitor symptoms closely.  If symptoms worsen or if you have severe symptoms including breathing issues, throat closure, significant swelling, whole body hives, severe diarrhea and vomiting, lightheadedness then use epinephrine  and seek immediate medical care afterwards. Emergency action  plan in place.    GERD (gastroesophageal reflux disease) Continue appropriate reflux lifestyle modifications. Continue omeprazole  20mg  one tablet in the morning. Nothing to eat or drink for 30 minutes afterwards.  May take famotidine  20mg  daily as needed.   Follow up in 6 months or sooner if needed.   Reducing Pollen Exposure Pollen seasons: trees (spring), grass (summer) and ragweed/weeds (fall). Keep windows closed in your home and car to lower pollen exposure.  Install air conditioning in the bedroom and throughout the house if possible.  Avoid going out in dry windy days - especially early morning. Pollen counts are highest between 5 - 10 AM and on dry, hot and windy days.  Save outside activities for late afternoon or after a heavy rain, when pollen levels are lower.  Avoid mowing of grass if you have grass pollen allergy. Be aware that pollen can also be transported indoors on people and pets.  Dry your clothes in an automatic dryer rather than hanging them outside where they might collect pollen.  Rinse hair and eyes before bedtime.

## 2024-11-08 NOTE — Progress Notes (Signed)
 Follow Up Note  RE: Gabrielle Shaw MRN: [id number] DOB: 10-27-1961 Date of Office Visit: 11/08/2024  Referring provider: Leonce Sink, MD Primary care provider: Leonce Sink, MD  Chief Complaint: Follow-up and Medication Refill (Follow up, doing well. Refill all meds.)  History of Present Illness: I had the pleasure of seeing Gabrielle Shaw for a follow up visit at the [facility] on 11/08/2024. She is a 63 y.o. female, who is being followed for rash, allergic rhinoconjunctivitis on AIT, asthma, food allergy, GERD. Her previous allergy office visit was on 05/03/2024 with Dr. Luke. Today is a regular follow up visit.  Rash resolved.  Asthma Takes Symbicort  80mcg 1-2 puffs twice a day and if misses a dose then gets shortness of breath. Denies any SOB, coughing, wheezing, chest tightness, nocturnal awakenings, ER/urgent care visits or prednisone  use since the last visit.  Environmental allergies AIT going well and due today.  Currently using Flonase  1-2 sprays per nostril once a day, and only suing azelastine  nasal spray as needed.  Some watery eyes but not sure of the name of eye drops she has at home. She had eye surgery about 1 year ago. Only taking allergy medications as needed.   Food allergies Avoiding avoidance of egg, peanuts, tree nuts, shellfish, bananas, and strawberries. No reactions. Needs epipen  refilled.  Reflux is doing well.   Assessment and Plan: Gabrielle Shaw is a 63 y.o. female with:  Seasonal allergic rhinitis due to pollen Allergic rhinitis due to dust mite Allergic conjunctivitis of both eyes Past history - on AIT (cat-tree-grass + mite-weed). Tried to stop before but restarted due to worsening symptoms.  Interim history - controlled. Continue environmental control measures. Continue allergy injections - given today.  Use Flonase  (fluticasone ) nasal spray 1-2 sprays per nostril once a day as needed for nasal congestion.  Use azelastine   nasal spray 1-2 sprays per nostril twice a day as needed for runny nose/drainage. Nasal saline spray (i.e., Simply Saline) or nasal saline lavage (i.e., NeilMed) is recommended as needed and prior to medicated nasal sprays. May use over the counter antihistamines such as Zyrtec (cetirizine), Claritin (loratadine), Allegra (fexofenadine), or Xyzal  (levocetirizine) daily as needed. Reach out to your eye doctor about the eye drops.  Moderate persistent asthma without complication Does better if she takes inhaler twice a day. Today's spirometry was normal. Daily controller medication(s): continue Symbicort  80mcg 1-2 puffs 2 times per day with spacer and rinse mouth after each use.  May use albuterol  rescue inhaler 2 puffs every 4 to 6 hours as needed for shortness of breath, chest tightness, coughing, and wheezing. Monitor frequency of use - if you need to use it more than twice per week on a consistent basis let us  know.    Anaphylactic reaction due to food, subsequent encounter Past history - 2018 blood work was negative to egg. Peanuts caused anaphylaxis requiring Epipen . Avoiding tree nuts. Failed egg challenge? Reaction to shrimp in the past. 2022 Blood work was negative to egg, fish, peanuts, tree nuts, strawberry, banana. Borderline positive to shellfish. Tolerates finned fish. Not interested in food challenges.  Continue careful avoidance of egg, peanuts, tree nuts, shellfish, bananas, and strawberries. Okay to eat flounder.  For mild symptoms you can take over the counter antihistamines (zyrtec 10mg  to 20mg ) and monitor symptoms closely.  If symptoms worsen or if you have severe symptoms including breathing issues, throat closure, significant swelling, whole body hives, severe diarrhea and vomiting, lightheadedness then use epinephrine  and  seek immediate medical care afterwards. Emergency action plan in place.   Gastroesophageal reflux disease, unspecified whether esophagitis  present Continue appropriate reflux lifestyle modifications. Continue omeprazole  20mg  one tablet in the morning. Nothing to eat or drink for 30 minutes afterwards.  May take famotidine  20mg  daily as needed.   Return in about 6 months (around 05/08/2025).  Meds ordered this encounter  Medications   fluticasone  (FLONASE ) 50 MCG/ACT nasal spray    Sig: Place 1-2 sprays into both nostrils daily as needed (nasal congestion).    Dispense:  16 g    Refill:  5   EPINEPHrine  0.3 mg/0.3 mL IJ SOAJ injection    Sig: Inject 0.3 mg into the muscle as needed for anaphylaxis.    Dispense:  2 each    Refill:  1    May dispense generic/Mylan/Teva brand.   omeprazole  (PRILOSEC) 20 MG capsule    Sig: Take 1 capsule (20 mg total) by mouth daily.    Dispense:  90 capsule    Refill:  2   albuterol  (VENTOLIN  HFA) 108 (90 Base) MCG/ACT inhaler    Sig: Inhale 2 puffs into the lungs every 4 (four) hours as needed for wheezing or shortness of breath.    Dispense:  18 g    Refill:  1   azelastine  (ASTELIN ) 0.1 % nasal spray    Sig: Place 1-2 sprays into both nostrils 2 (two) times daily as needed (nasal drainage). Use in each nostril as directed    Dispense:  30 mL    Refill:  5   cetirizine (ZYRTEC) 10 MG tablet    Sig: Take 1 tablet (10 mg total) by mouth daily.    Dispense:  90 tablet    Refill:  2   budesonide -formoterol  (SYMBICORT ) 80-4.5 MCG/ACT inhaler    Sig: 1-2 puffs 2 times per day with spacer and rinse mouth afterwards.    Dispense:  1 each    Refill:  5   Lab Orders  No laboratory test(s) ordered today    Diagnostics: Spirometry:  Tracings reviewed. Her effort: Good reproducible efforts. FVC: 2.118L FEV1: 1.93L, 015% predicted FEV1/FVC ratio: 89% Interpretation: Spirometry consistent with normal pattern.  Please see scanned spirometry results for details.  Results discussed with patient/family.   Medication List:  Current Outpatient Medications  Medication Sig Dispense Refill    albuterol  (PROVENTIL ) (2.5 MG/3ML) 0.083% nebulizer solution Take 3 mLs (2.5 mg total) by nebulization every 4 (four) hours as needed for wheezing or shortness of breath (coughing fits). 75 mL 1   albuterol  (VENTOLIN  HFA) 108 (90 Base) MCG/ACT inhaler Inhale 2 puffs into the lungs every 4 (four) hours as needed for wheezing or shortness of breath. 18 g 1   aspirin EC 81 MG tablet Take 81 mg by mouth as needed.      azelastine  (ASTELIN ) 0.1 % nasal spray Place 1-2 sprays into both nostrils 2 (two) times daily as needed (nasal drainage). Use in each nostril as directed 30 mL 5   Blood Glucose Monitoring Suppl (ONETOUCH VERIO) w/Device KIT by Does not apply route.     budesonide -formoterol  (SYMBICORT ) 80-4.5 MCG/ACT inhaler 1-2 puffs 2 times per day with spacer and rinse mouth afterwards. 1 each 5   cetirizine (ZYRTEC) 10 MG tablet Take 1 tablet (10 mg total) by mouth daily. 90 tablet 2   Dapsone  (ACZONE ) 7.5 % GEL Apply to face daily 60 g 2   desonide (DESOWEN) 0.05 % ointment Apply to the scalp once daily  as needed for itching.     diphenhydrAMINE (BENADRYL) 50 MG tablet Take 50 mg by mouth every 6 (six) hours as needed for itching.     donepezil (ARICEPT) 5 MG tablet Take by mouth.     ELDERBERRY PO Take 1 tablet by mouth daily.     empagliflozin (JARDIANCE) 10 MG TABS tablet Take 1 tablet by mouth daily.     ENBREL SURECLICK 50 MG/ML injection Inject into the skin.     EPINEPHrine  0.3 mg/0.3 mL IJ SOAJ injection Inject 0.3 mg into the muscle as needed for anaphylaxis. 2 each 1   famotidine  (PEPCID ) 20 MG tablet Take 1 tablet (20 mg total) by mouth daily as needed for heartburn or indigestion. 90 tablet 2   finasteride (PROSCAR) 5 MG tablet      fluocinonide ointment (LIDEX) 0.05 % APPLY OINTMENT TO SCALP 2 TO 3 TIMES PER WEEK **DO NOT APPLY TO FACE**     fluticasone  (FLONASE ) 50 MCG/ACT nasal spray Place 1-2 sprays into both nostrils daily as needed (nasal congestion). 16 g 5   folic acid  (FOLVITE) 1 MG tablet Take 1 mg by mouth daily.     HYDROcodone -acetaminophen  (NORCO/VICODIN) 5-325 MG tablet Take 1 tablet by mouth every 6 (six) hours as needed.     hydrocortisone  2.5 % cream Apply topically 2 (two) times daily as needed. 28 g 2   hydrOXYzine  (ATARAX /VISTARIL ) 25 MG tablet ONE TABLET AT BEDTIME AS NEEDED FOR ITCHING. 30 tablet 3   ibuprofen  (ADVIL ,MOTRIN ) 800 MG tablet Take 1 tablet (800 mg total) by mouth every 8 (eight) hours as needed for mild pain. 30 tablet 0   levocetirizine (XYZAL ) 5 MG tablet Take 1 tablet (5 mg total) by mouth daily as needed for allergies. 90 tablet 2   lisinopril-hydrochlorothiazide (PRINZIDE,ZESTORETIC) 10-12.5 MG tablet Take 0.5 tablets by mouth.      lisinopril-hydrochlorothiazide (ZESTORETIC) 10-12.5 MG tablet Take by mouth.     meloxicam (MOBIC) 7.5 MG tablet Take by mouth.     methocarbamol  (ROBAXIN ) 500 MG tablet Take 500 mg by mouth 3 (three) times daily.     metroNIDAZOLE  (METROGEL ) 1 % gel Apply to face daily for Rosacea 45 g 3   minoxidil (LONITEN) 2.5 MG tablet SMARTSIG:.5 Tablet(s) By Mouth Daily     NON FORMULARY      omeprazole  (PRILOSEC) 20 MG capsule Take 1 capsule (20 mg total) by mouth daily. 90 capsule 2   potassium chloride (KLOR-CON) 10 MEQ tablet Take 10 mEq by mouth 2 (two) times daily.     predniSONE  (DELTASONE ) 10 MG tablet Take 2 tablets once a day for 4 days, then take 1 tablet on the 5th day, then stop. 9 tablet 0   pregabalin (LYRICA) 100 MG capsule Take by mouth.     Respiratory Therapy Supplies (NEBULIZER/TUBING/MOUTHPIECE) KIT Use as directed 1 kit 1   traMADol (ULTRAM) 50 MG tablet      triamcinolone  ointment (KENALOG ) 0.1 % APPLY TOPICALLY TO THE AFFECTED AREA TWICE DAILY 30 g 1   ZINC SULFATE ER PO Take by mouth.     No current facility-administered medications for this visit.   Allergies: Allergies  Allergen Reactions   Iodinated Contrast Media Anaphylaxis    Cardiac Arrest Seizure   Other Hives    Shellfish Allergy Hives   Sulfasalazine Hives   Tetrofosmin [Technetium-63m] Hives and Anaphylaxis   Banana Hives   Cat Dander Other (See Comments)    Hives    Egg Protein-Containing  Drug Products Nausea And Vomiting   Haemophilus B Polysaccharide Vaccine Other (See Comments)    OTHER  OTHER     Strawberry Extract Hives   Sulfamethoxazole Rash   Biaxin [Clarithromycin] Hives   Latex Hives   Peanuts [Peanut Oil] Hives   Pravastatin Hives   Shellfish Protein-Containing Drug Products Hives   Sulfa Antibiotics Hives and Rash   I reviewed her past medical history, social history, family history, and environmental history and no significant changes have been reported from her previous visit.  Review of Systems  Constitutional:  Negative for appetite change, chills, fever and unexpected weight change.  HENT:  Negative for postnasal drip and sneezing.   Eyes:  Negative for itching.  Respiratory:  Negative for cough, chest tightness, shortness of breath and wheezing.   Gastrointestinal:  Negative for abdominal pain.  Skin:  Negative for rash.  Allergic/Immunologic: Positive for environmental allergies and food allergies.  Neurological:  Negative for headaches.    Objective: BP 124/74   Pulse 80   Temp 98.4 F (36.9 C) (Temporal)   Resp 20   Ht 5' 1 (1.549 m)   Wt 171 lb 6.4 oz (77.7 kg)   SpO2 98%   BMI 32.39 kg/m  Body mass index is 32.39 kg/m. Physical Exam Vitals and nursing note reviewed.  Constitutional:      Appearance: Normal appearance. She is well-developed.  HENT:     Head: Normocephalic and atraumatic.     Right Ear: Tympanic membrane and external ear normal.     Left Ear: Tympanic membrane and external ear normal.     Nose: Nose normal.     Mouth/Throat:     Mouth: Mucous membranes are moist.     Pharynx: Oropharynx is clear.  Eyes:     Conjunctiva/sclera: Conjunctivae normal.  Cardiovascular:     Rate and Rhythm: Normal rate and regular rhythm.      Heart sounds: Normal heart sounds. No murmur heard. Pulmonary:     Effort: Pulmonary effort is normal.     Breath sounds: Normal breath sounds. No wheezing, rhonchi or rales.  Musculoskeletal:     Cervical back: Neck supple.  Skin:    General: Skin is warm.     Findings: No rash.  Neurological:     Mental Status: She is alert and oriented to person, place, and time.  Psychiatric:        Behavior: Behavior normal.    Previous notes and tests were reviewed. The plan was reviewed with the patient/family, and all questions/concerned were addressed.  It was my pleasure to see Gabrielle Shaw today and participate in her care. Please feel free to contact me with any questions or concerns.  Sincerely,  Orlan Cramp, DO Allergy & Immunology  [facility]  [facility] office: [phone] [facility] office: [phone]

## 2024-11-29 ENCOUNTER — Ambulatory Visit

## 2024-11-29 DIAGNOSIS — J302 Other seasonal allergic rhinitis: Secondary | ICD-10-CM | POA: Diagnosis not present

## 2024-11-29 DIAGNOSIS — J309 Allergic rhinitis, unspecified: Secondary | ICD-10-CM

## 2024-12-07 ENCOUNTER — Other Ambulatory Visit: Payer: Self-pay | Admitting: Allergy

## 2024-12-24 ENCOUNTER — Other Ambulatory Visit: Payer: Self-pay | Admitting: Allergy

## 2025-01-24 ENCOUNTER — Ambulatory Visit: Admitting: *Deleted

## 2025-01-24 DIAGNOSIS — J302 Other seasonal allergic rhinitis: Secondary | ICD-10-CM

## 2025-05-09 ENCOUNTER — Ambulatory Visit: Admitting: Allergy
